# Patient Record
Sex: Female | Born: 1940
Health system: Southern US, Community
[De-identification: ages and names within clinical notes are randomized; demographics above are authoritative.]

## PROBLEM LIST (undated history)

## (undated) DIAGNOSIS — F515 Nightmare disorder: Secondary | ICD-10-CM

## (undated) DIAGNOSIS — G3184 Mild cognitive impairment, so stated: Secondary | ICD-10-CM

## (undated) DIAGNOSIS — G43909 Migraine, unspecified, not intractable, without status migrainosus: Secondary | ICD-10-CM

## (undated) DIAGNOSIS — F800 Phonological disorder: Secondary | ICD-10-CM

## (undated) DIAGNOSIS — G43901 Migraine, unspecified, not intractable, with status migrainosus: Secondary | ICD-10-CM

## (undated) DIAGNOSIS — G4750 Parasomnia, unspecified: Secondary | ICD-10-CM

## (undated) DIAGNOSIS — G43111 Migraine with aura, intractable, with status migrainosus: Secondary | ICD-10-CM

## (undated) DIAGNOSIS — G4700 Insomnia, unspecified: Secondary | ICD-10-CM

## (undated) DIAGNOSIS — K2100 Gastro-esophageal reflux disease with esophagitis, without bleeding: Secondary | ICD-10-CM

## (undated) DIAGNOSIS — R011 Cardiac murmur, unspecified: Secondary | ICD-10-CM

## (undated) DIAGNOSIS — R319 Hematuria, unspecified: Secondary | ICD-10-CM

## (undated) DIAGNOSIS — I739 Peripheral vascular disease, unspecified: Secondary | ICD-10-CM

## (undated) DIAGNOSIS — E871 Hypo-osmolality and hyponatremia: Secondary | ICD-10-CM

## (undated) DIAGNOSIS — M1990 Unspecified osteoarthritis, unspecified site: Secondary | ICD-10-CM

## (undated) DIAGNOSIS — F514 Sleep terrors [night terrors]: Secondary | ICD-10-CM

## (undated) DIAGNOSIS — H53419 Scotoma involving central area, unspecified eye: Secondary | ICD-10-CM

## (undated) DIAGNOSIS — Z87898 Personal history of other specified conditions: Secondary | ICD-10-CM

## (undated) DIAGNOSIS — G4752 REM sleep behavior disorder: Secondary | ICD-10-CM

## (undated) HISTORY — DX: Hematuria, unspecified: R31.9

## (undated) HISTORY — DX: Unspecified osteoarthritis, unspecified site: M19.90

## (undated) HISTORY — DX: Personal history of other specified conditions: Z87.898

## (undated) HISTORY — DX: Cardiac murmur, unspecified: R01.1

## (undated) HISTORY — DX: Gastro-esophageal reflux disease with esophagitis, without bleeding: K21.00

## (undated) HISTORY — DX: Peripheral vascular disease, unspecified: I73.9

## (undated) HISTORY — PX: CATARACT EXTRACTION, BILATERAL: SHX1313

## (undated) HISTORY — DX: Migraine, unspecified, not intractable, without status migrainosus: G43.909

## (undated) HISTORY — DX: Migraine, unspecified, not intractable, with status migrainosus: G43.901

## (undated) HISTORY — DX: Gastro-esophageal reflux disease with esophagitis: K21.0

## (undated) HISTORY — PX: CARPAL TUNNEL RELEASE: SHX101

---
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
## 1997-08-29 ENCOUNTER — Other Ambulatory Visit: Admission: RE | Admit: 1997-08-29 | Discharge: 1997-08-29 | Payer: Self-pay | Admitting: Gynecology

## 1998-03-16 ENCOUNTER — Other Ambulatory Visit: Admission: RE | Admit: 1998-03-16 | Discharge: 1998-03-16 | Payer: Self-pay | Admitting: Gynecology

## 1998-05-22 ENCOUNTER — Encounter: Payer: Self-pay | Admitting: Gynecology

## 1998-05-22 ENCOUNTER — Ambulatory Visit (HOSPITAL_COMMUNITY): Admission: RE | Admit: 1998-05-22 | Discharge: 1998-05-22 | Payer: Self-pay | Admitting: Gynecology

## 1999-04-26 ENCOUNTER — Other Ambulatory Visit: Admission: RE | Admit: 1999-04-26 | Discharge: 1999-04-26 | Payer: Self-pay | Admitting: Gynecology

## 1999-06-27 ENCOUNTER — Other Ambulatory Visit: Admission: RE | Admit: 1999-06-27 | Discharge: 1999-06-27 | Payer: Self-pay | Admitting: Gynecology

## 1999-06-29 ENCOUNTER — Ambulatory Visit (HOSPITAL_COMMUNITY): Admission: RE | Admit: 1999-06-29 | Discharge: 1999-06-29 | Payer: Self-pay | Admitting: *Deleted

## 2000-05-23 ENCOUNTER — Other Ambulatory Visit: Admission: RE | Admit: 2000-05-23 | Discharge: 2000-05-23 | Payer: Self-pay | Admitting: Gynecology

## 2000-06-10 ENCOUNTER — Encounter: Admission: RE | Admit: 2000-06-10 | Discharge: 2000-06-10 | Payer: Self-pay | Admitting: Cardiology

## 2000-06-10 ENCOUNTER — Encounter: Payer: Self-pay | Admitting: Cardiology

## 2000-06-11 ENCOUNTER — Other Ambulatory Visit: Admission: RE | Admit: 2000-06-11 | Discharge: 2000-06-11 | Payer: Self-pay | Admitting: Gynecology

## 2000-06-11 ENCOUNTER — Encounter (INDEPENDENT_AMBULATORY_CARE_PROVIDER_SITE_OTHER): Payer: Self-pay

## 2000-06-13 ENCOUNTER — Ambulatory Visit (HOSPITAL_COMMUNITY): Admission: RE | Admit: 2000-06-13 | Discharge: 2000-06-13 | Payer: Self-pay | Admitting: Cardiology

## 2000-07-04 ENCOUNTER — Encounter: Payer: Self-pay | Admitting: Gynecology

## 2000-07-04 ENCOUNTER — Ambulatory Visit (HOSPITAL_COMMUNITY): Admission: RE | Admit: 2000-07-04 | Discharge: 2000-07-04 | Payer: Self-pay | Admitting: Gynecology

## 2000-09-15 ENCOUNTER — Other Ambulatory Visit: Admission: RE | Admit: 2000-09-15 | Discharge: 2000-09-15 | Payer: Self-pay | Admitting: Gynecology

## 2000-10-29 ENCOUNTER — Encounter (INDEPENDENT_AMBULATORY_CARE_PROVIDER_SITE_OTHER): Payer: Self-pay | Admitting: Specialist

## 2000-10-29 ENCOUNTER — Ambulatory Visit (HOSPITAL_COMMUNITY): Admission: RE | Admit: 2000-10-29 | Discharge: 2000-10-29 | Payer: Self-pay | Admitting: Gynecology

## 2001-03-30 ENCOUNTER — Other Ambulatory Visit: Admission: RE | Admit: 2001-03-30 | Discharge: 2001-03-30 | Payer: Self-pay | Admitting: Gynecology

## 2001-07-17 ENCOUNTER — Encounter: Admission: RE | Admit: 2001-07-17 | Discharge: 2001-07-17 | Payer: Self-pay | Admitting: Internal Medicine

## 2001-07-17 ENCOUNTER — Encounter: Payer: Self-pay | Admitting: Internal Medicine

## 2001-08-27 ENCOUNTER — Encounter: Payer: Self-pay | Admitting: Internal Medicine

## 2001-08-27 ENCOUNTER — Ambulatory Visit (HOSPITAL_COMMUNITY): Admission: RE | Admit: 2001-08-27 | Discharge: 2001-08-27 | Payer: Self-pay | Admitting: Internal Medicine

## 2001-11-05 ENCOUNTER — Other Ambulatory Visit: Admission: RE | Admit: 2001-11-05 | Discharge: 2001-11-05 | Payer: Self-pay | Admitting: Gynecology

## 2002-06-03 ENCOUNTER — Other Ambulatory Visit: Admission: RE | Admit: 2002-06-03 | Discharge: 2002-06-03 | Payer: Self-pay | Admitting: Gynecology

## 2003-06-12 ENCOUNTER — Emergency Department (HOSPITAL_COMMUNITY): Admission: EM | Admit: 2003-06-12 | Discharge: 2003-06-13 | Payer: Self-pay | Admitting: Emergency Medicine

## 2004-05-09 ENCOUNTER — Ambulatory Visit (HOSPITAL_COMMUNITY): Admission: RE | Admit: 2004-05-09 | Discharge: 2004-05-09 | Payer: Self-pay | Admitting: Internal Medicine

## 2004-08-17 ENCOUNTER — Other Ambulatory Visit: Admission: RE | Admit: 2004-08-17 | Discharge: 2004-08-17 | Payer: Self-pay | Admitting: Gynecology

## 2005-05-21 ENCOUNTER — Ambulatory Visit (HOSPITAL_COMMUNITY): Admission: RE | Admit: 2005-05-21 | Discharge: 2005-05-21 | Payer: Self-pay | Admitting: Internal Medicine

## 2005-08-29 ENCOUNTER — Other Ambulatory Visit: Admission: RE | Admit: 2005-08-29 | Discharge: 2005-08-29 | Payer: Self-pay | Admitting: Gynecology

## 2006-05-22 ENCOUNTER — Ambulatory Visit (HOSPITAL_COMMUNITY): Admission: RE | Admit: 2006-05-22 | Discharge: 2006-05-22 | Payer: Self-pay | Admitting: Internal Medicine

## 2006-09-01 ENCOUNTER — Other Ambulatory Visit: Admission: RE | Admit: 2006-09-01 | Discharge: 2006-09-01 | Payer: Self-pay | Admitting: Gynecology

## 2006-11-08 ENCOUNTER — Encounter: Admission: RE | Admit: 2006-11-08 | Discharge: 2006-11-08 | Payer: Self-pay | Admitting: Internal Medicine

## 2007-06-02 ENCOUNTER — Ambulatory Visit (HOSPITAL_COMMUNITY): Admission: RE | Admit: 2007-06-02 | Discharge: 2007-06-02 | Payer: Self-pay | Admitting: Internal Medicine

## 2007-10-08 ENCOUNTER — Encounter: Admission: RE | Admit: 2007-10-08 | Discharge: 2007-10-08 | Payer: Self-pay | Admitting: Internal Medicine

## 2008-06-16 ENCOUNTER — Ambulatory Visit (HOSPITAL_COMMUNITY): Admission: RE | Admit: 2008-06-16 | Discharge: 2008-06-16 | Payer: Self-pay | Admitting: Internal Medicine

## 2008-08-31 ENCOUNTER — Encounter: Admission: RE | Admit: 2008-08-31 | Discharge: 2008-08-31 | Payer: Self-pay | Admitting: Neurosurgery

## 2009-03-07 ENCOUNTER — Encounter: Admission: RE | Admit: 2009-03-07 | Discharge: 2009-03-07 | Payer: Self-pay | Admitting: Otolaryngology

## 2009-09-19 ENCOUNTER — Ambulatory Visit (HOSPITAL_COMMUNITY): Admission: RE | Admit: 2009-09-19 | Discharge: 2009-09-19 | Payer: Self-pay | Admitting: Internal Medicine

## 2009-09-26 ENCOUNTER — Encounter: Admission: RE | Admit: 2009-09-26 | Discharge: 2009-09-26 | Payer: Self-pay | Admitting: Otolaryngology

## 2009-10-11 ENCOUNTER — Other Ambulatory Visit: Admission: RE | Admit: 2009-10-11 | Discharge: 2009-10-11 | Payer: Self-pay | Admitting: Interventional Radiology

## 2009-10-11 ENCOUNTER — Encounter: Admission: RE | Admit: 2009-10-11 | Discharge: 2009-10-11 | Payer: Self-pay | Admitting: Otolaryngology

## 2010-02-19 ENCOUNTER — Encounter: Admission: RE | Admit: 2010-02-19 | Discharge: 2010-02-19 | Payer: Self-pay | Admitting: Otolaryngology

## 2010-03-23 ENCOUNTER — Encounter
Admission: RE | Admit: 2010-03-23 | Discharge: 2010-03-23 | Payer: Self-pay | Source: Home / Self Care | Attending: Orthopedic Surgery | Admitting: Orthopedic Surgery

## 2010-08-10 NOTE — Cardiovascular Report (Signed)
[place]  Patient:    Kara Tapia, Kara Tapia                        MRN: [id number] Proc. Date: 06/13/00 Adm. Date:  [id number] Disc. Date: [id number] Attending:  Loreli Dollar CC:         Jenel Lucks, M.D.  Cardiac Catheterization Lab   Cardiac Catheterization  INDICATIONS FOR TEST:  [name] is a 70 year old who began having episodes of exertional pressure and tightness about a week ago.  The episodes began occurring at rest and for no apparent reason.  There was no radiation pattern. It had anginal quality to it.  She had had a nuclear study a year ago that was unremarkable for a different type of less significant chest pain.  Because of the above-mentioned symptoms, she was brought in for outpatient cardiac catheterization.  PROCEDURES: 1. Left heart catheterization. 2. Selective right and left coronary arteriography. 3. Ventriculography in the RAO projection.  CARDIOLOGIST:  Thereasa Solo. Little, M.D.  COMPLICATIONS:  None.  EQUIPMENT USED:  6-French Judkins configuration catheters.  DESCRIPTION OF PROCEDURE:  The patient was prepped and draped in the usual sterile fashion exposing the right groin, applying local anesthetic with 1% Novocaine.  The Seldinger technique was employed and 6-French introducer sheath placed in the right femoral artery.  Selective right and left coronary arteriography and ventriculography in the RAO projection using 25 cc of contrast at 12 cc per second was performed.  RESULTS:  I.   HEMODYNAMIC MONITORING:  Central aortic pressure 157/83, left ventricular      pressure 165/17, and there was a 7 mm aortic valve gradient noted at the      time of pullback. II.  VENTRICULOGRAPHY: Ventriculography in the RAO projection revealed normal      left ventricular systolic function with ejection fraction greater than      60%.  End-diastolic pressure was 16.  Mitral valve prolapse without      mitral regurgitation was  seen.  III. CORONARY ARTERIOGRAPHY:  On fluoroscopy, there was a minor nonobstructive      flake of calcium noted in the proximal portion of the LAD.      1. Left main normal.      2. LAD: The LAD extended down and around the apex of the heart supplying         the distal portion of the posterior wall.  The LAD gave rise to a very         large first diagonal branch, and the its entire system was free of         disease except for the above-mentioned flake of calcium in the         proximal portion.      3. Circumflex: The Circumflex was a dominant system with a very large         OM-1, a small OM-2 and OM-3, and a medium-size PDA.  This entire         system was free of disease.      4. RCA: The RCA was a small nondominant vessel supplying only the RV.  CONCLUSION: 1. Moderate calcification in the left anterior descending artery with no    evidence of obstruction. 2. Normal left ventricular systolic function. 3. Mitral valve prolapse without mitral regurgitation.  At this point, I cannot explain her chest pain from a cardiac standpoint other than perhaps from her  mitral valve prolapse.  She will be discharged to home later today with followup in my office for reevaluation of the catheterization site and to go over catheterization results. DD:  06/13/00 TD:  06/14/00 Job: [id number]

## 2010-08-10 NOTE — Op Note (Signed)
[facility]  Patient:    Kara Tapia, Kara Tapia                        MRN: [id number] Proc. Date: 10/30/00 Adm. Date:  [id number] Attending:  Katrina Stack CC:         [facility]   Operative Report  PREOPERATIVE DIAGNOSES:  Persistent abnormal genital cytology with inadequate colposcopy.  POSTOPERATIVE DIAGNOSES:  Persistent abnormal genital cytology with inadequate colposcopy.  PROCEDURE:  LEEP cone.  ANESTHESIA:  Paracervical block, 0.25% Marcaine.  DESCRIPTION OF PROCEDURE:  Under excellent paracervical block anesthesia with the patient prepped and draped in the lithotomy position with her cervix stained with Lugol iodine, LEEP cone procedure was performed with excision using the smallest [place] loop, a cylinder of tissue was excised and submitted for pathologic examination.  The initial cone was carried approximately 1.5 cm down the canal.  An additional excision beyond that for another several millimeters was used to excise the upper portion of transformation zone and to shape the cone.  At the end of the procedure there was no significant bleeding.  Procedure was terminated without complications.  Patient returned to the recovery room in excellent condition. DD:  10/29/00 TD:  10/29/00 Job: [id number]

## 2010-10-11 ENCOUNTER — Other Ambulatory Visit (HOSPITAL_COMMUNITY): Payer: Self-pay | Admitting: Internal Medicine

## 2010-10-11 DIAGNOSIS — Z1231 Encounter for screening mammogram for malignant neoplasm of breast: Secondary | ICD-10-CM

## 2010-10-19 ENCOUNTER — Ambulatory Visit (HOSPITAL_COMMUNITY)
Admission: RE | Admit: 2010-10-19 | Discharge: 2010-10-19 | Disposition: A | Discharge status: Home / Self Care | Payer: Medicare Other | Source: Ambulatory Visit | Attending: Internal Medicine | Admitting: Internal Medicine

## 2010-10-19 DIAGNOSIS — Z1231 Encounter for screening mammogram for malignant neoplasm of breast: Secondary | ICD-10-CM

## 2011-05-13 ENCOUNTER — Other Ambulatory Visit: Payer: Self-pay

## 2011-05-13 ENCOUNTER — Encounter (HOSPITAL_COMMUNITY): Payer: Self-pay | Admitting: *Deleted

## 2011-05-13 ENCOUNTER — Emergency Department (HOSPITAL_COMMUNITY): Payer: Medicare Other

## 2011-05-13 ENCOUNTER — Emergency Department (HOSPITAL_COMMUNITY)
Admission: EM | Admit: 2011-05-13 | Discharge: 2011-05-13 | Disposition: A | Discharge status: Home / Self Care | Payer: Medicare Other | Attending: Emergency Medicine | Admitting: Emergency Medicine

## 2011-05-13 DIAGNOSIS — Z79899 Other long term (current) drug therapy: Secondary | ICD-10-CM | POA: Insufficient documentation

## 2011-05-13 DIAGNOSIS — F17200 Nicotine dependence, unspecified, uncomplicated: Secondary | ICD-10-CM | POA: Insufficient documentation

## 2011-05-13 DIAGNOSIS — R079 Chest pain, unspecified: Secondary | ICD-10-CM | POA: Insufficient documentation

## 2011-05-13 LAB — BASIC METABOLIC PANEL
CO2: 26 mEq/L (ref 19–32)
Calcium: 9.7 mg/dL (ref 8.4–10.5)
Creatinine, Ser: 0.83 mg/dL (ref 0.50–1.10)
Glucose, Bld: 105 mg/dL — ABNORMAL HIGH (ref 70–99)

## 2011-05-13 LAB — CBC
HCT: 42.2 % (ref 36.0–46.0)
Hemoglobin: 14.8 g/dL (ref 12.0–15.0)
MCH: 31.8 pg (ref 26.0–34.0)
MCHC: 35.1 g/dL (ref 30.0–36.0)
MCV: 90.8 fL (ref 78.0–100.0)

## 2011-05-13 LAB — HEPATIC FUNCTION PANEL
AST: 24 U/L (ref 0–37)
Albumin: 4.2 g/dL (ref 3.5–5.2)
Alkaline Phosphatase: 80 U/L (ref 39–117)
Total Bilirubin: 0.5 mg/dL (ref 0.3–1.2)

## 2011-05-13 LAB — URINALYSIS, ROUTINE W REFLEX MICROSCOPIC
Bilirubin Urine: NEGATIVE
Glucose, UA: NEGATIVE mg/dL
Ketones, ur: NEGATIVE mg/dL
Protein, ur: NEGATIVE mg/dL

## 2011-05-13 LAB — URINE MICROSCOPIC-ADD ON

## 2011-05-13 LAB — TROPONIN I: Troponin I: <0.3 ng/mL (ref <0.30)

## 2011-05-13 NOTE — Discharge Instructions (Signed)
Chest Pain (Nonspecific) It is often hard to give a specific diagnosis for the cause of chest pain. There is always a chance that your pain could be related to something serious, such as a heart attack or a blood clot in the lungs. You need to follow up with your caregiver for further evaluation. CAUSES   Heartburn.   Pneumonia or bronchitis.   Anxiety and stress.   Inflammation around your heart (pericarditis) or lung (pleuritis or pleurisy).   A blood clot in the lung.   A collapsed lung (pneumothorax). It can develop suddenly on its own (spontaneous pneumothorax) or from injury (trauma) to the chest.  The chest wall is composed of bones, muscles, and cartilage. Any of these can be the source of the pain.  The bones can be bruised by injury.   The muscles or cartilage can be strained by coughing or overwork.   The cartilage can be affected by inflammation and become sore (costochondritis).  DIAGNOSIS  Lab tests or other studies, such as X-rays, an EKG, stress testing, or cardiac imaging, may be needed to find the cause of your pain.  TREATMENT   Treatment depends on what may be causing your chest pain. Treatment may include:   Acid blockers for heartburn.   Anti-inflammatory medicine.   Pain medicine for inflammatory conditions.   Antibiotics if an infection is present.   You may be advised to change lifestyle habits. This includes stopping smoking and avoiding caffeine and chocolate.   You may be advised to keep your head raised (elevated) when sleeping. This reduces the chance of acid going backward from your stomach into your esophagus.   Most of the time, nonspecific chest pain will improve within 2 to 3 days with rest and mild pain medicine.  HOME CARE INSTRUCTIONS   If antibiotics were prescribed, take the full amount even if you start to feel better.   For the next few days, avoid physical activities that bring on chest pain. Continue physical activities as  directed.   Do not smoke cigarettes or drink alcohol until your symptoms are gone.   Only take over-the-counter or prescription medicine for pain, discomfort, or fever as directed by your caregiver.   Follow your caregiver's suggestions for further testing if your chest pain does not go away.   Keep any follow-up appointments you made. If you do not go to an appointment, you could develop lasting (chronic) problems with pain. If there is any problem keeping an appointment, you must call to reschedule.  SEEK MEDICAL CARE IF:   You think you are having problems from the medicine you are taking. Read your medicine instructions carefully.   Your chest pain does not go away, even after treatment.   You develop a rash with blisters on your chest.  SEEK IMMEDIATE MEDICAL CARE IF:   You have increased chest pain or pain that spreads to your arm, neck, jaw, back, or belly (abdomen).   You develop shortness of breath, an increasing cough, or you are coughing up blood.   You have severe back or abdominal pain, feel sick to your stomach (nauseous) or throw up (vomit).   You develop severe weakness, fainting, or chills.   You have an oral temperature above 102 F (38.9 C), not controlled by medicine.  THIS IS AN EMERGENCY. Do not wait to see if the pain will go away. Get medical help at once. Call your local emergency services (911 in [place] Do not drive yourself to   the hospital. MAKE SURE YOU:   Understand these instructions.   Will watch your condition.   Will get help right away if you are not doing well or get worse.  Document Released: 12/19/2004 Document Revised: 11/21/2010 Document Reviewed: 10/15/2007 [employer] Patient Information 2012 [employer]. 

## 2011-05-13 NOTE — ED Provider Notes (Signed)
History     CSN: [id number]  Arrival date & time 05/13/11  1350   First MD Initiated Contact with Patient 05/13/11 1501      Chief Complaint  Patient presents with  . Chest Pain     HPI The patient awoke with chest pain, 7 hours prior to presentation.  She notes that since onset she has had richer near her sternum with radiation to her back.  The pain is worse with deep inspiration.  She also notes mild dyspnea, mild lightheadedness, mild nausea.  No syncope, no vomiting.  No clear exertional pain.  No relief with anything, no other clear exacerbating factors. History reviewed. No pertinent past medical history.  History reviewed. No pertinent past surgical history.  No family history on file.  History  Substance Use Topics  . Smoking status: Current Everyday Smoker -- 0.5 packs/day    Types: Cigarettes  . Smokeless tobacco: Not on file  . Alcohol Use: 4.2 oz/week    7 Glasses of wine per week    OB History    Grav Para Term Preterm Abortions TAB SAB Ect Mult Living                  Review of Systems  Constitutional:       HPI  HENT:       HPI otherwise negative  Eyes: Negative.   Respiratory:       HPI, otherwise negative  Cardiovascular:       HPI, otherwise nmegative  Gastrointestinal: Negative for vomiting.  Genitourinary:       HPI, otherwise negative  Musculoskeletal:       HPI, otherwise negative  Skin: Negative.   Neurological: Negative for syncope.    Allergies  Review of patient's allergies indicates not on file.  Home Medications   Current Outpatient Rx  Name Route Sig Dispense Refill  . MELATONIN 5 MG PO TABS Oral Take 5 mg by mouth at bedtime.    . OMEPRAZOLE MAGNESIUM 20 MG PO TBEC Oral Take 20 mg by mouth daily.      BP 171/95  Pulse 86  Temp(Src) 97.8 F (36.6 C) (Oral)  Resp 16  SpO2 98%  Physical Exam  Nursing note and vitals reviewed. Constitutional: She is oriented to person, place, and time. She appears well-developed  and well-nourished. No distress.  HENT:  Head: Normocephalic and atraumatic.  Eyes: Conjunctivae and EOM are normal.  Cardiovascular: Normal rate and regular rhythm.   Pulmonary/Chest: Effort normal and breath sounds normal. No stridor. No respiratory distress.  Abdominal: She exhibits no distension.  Musculoskeletal: She exhibits no edema.  Neurological: She is alert and oriented to person, place, and time. No cranial nerve deficit.  Skin: Skin is warm and dry.  Psychiatric: She has a normal mood and affect.    ED Course  Procedures (including critical care time)  Labs Reviewed  BASIC METABOLIC PANEL - Abnormal; Notable for the following:    Glucose, Bld 105 (*)    GFR calc non Af Amer 70 (*)    GFR calc Af Amer 82 (*)    All other components within normal limits  CBC  TROPONIN I  CBC  COMPREHENSIVE METABOLIC PANEL  LIPASE, BLOOD  D-DIMER, QUANTITATIVE   Dg Chest 2 View  05/13/2011  *RADIOLOGY REPORT*  Clinical Data: Chest pain, smoking history  CHEST - 2 VIEW  Comparison: Chest x-ray of 06/06/2009  Findings: No active infiltrate or effusion is seen.  Mediastinal contours  appear stable.  The heart is within normal limits in size. No bony abnormality is seen.  IMPRESSION: No active lung disease.  Original Report Authenticated By: Juline Patch, M.D.   cxr reviewed by me  Cardiac: 75 sr, normal  Pulse ox 100% 2 L [place], abnormal    Date: 05/13/2011  Rate: 87  Rhythm: normal sinus rhythm  QRS Axis: normal  Intervals: normal  ST/T Wave abnormalities: normal  Conduction Disutrbances:none  Narrative Interpretation:   Old EKG Reviewed: none available  rSR' - borderline ECG  SMOKING COUNSELING PROVIDED  No diagnosis found.   R/o acs / pe / pna.  Sx likely PNA vs. COPD. MDM  This 71 year old female now presents with one day of chest discomfort.  On exam she is in no distress, with no notable physical exam findings.  The patient's history of cigarette usage suggestive of  undiagnosed COPD.  The patient's labs are reassuring, including a negative dimer and troponin which is negative.  Given the description of the full day of chest pain cardiac ischemia would likely result in a positive troponin.  All results were discussed with the patient and her sister.  The patient was discharged in stable condition to follow up with her primary care physician this week.  Return precautions, as well as suggestions for additional evaluation, including pulmonary function testing were provided.        Gerhard Munch, MD 05/13/11 [phone]

## 2011-05-13 NOTE — ED Notes (Signed)
Pt reports onset of chest pain around 0800 with constant chest pain. States located in central chest between breast bones, radiates to back. Reports mild shortness of breath, lightheadedness. No aspirin PTA. No radiation to arms. Reports nausea, no vomiting.

## 2011-05-13 NOTE — ED Notes (Signed)
Pt reports pain worse with deep breath. States pain began this am around 0800.

## 2012-01-10 ENCOUNTER — Other Ambulatory Visit (HOSPITAL_COMMUNITY): Payer: Self-pay | Admitting: Internal Medicine

## 2012-01-10 DIAGNOSIS — Z1231 Encounter for screening mammogram for malignant neoplasm of breast: Secondary | ICD-10-CM

## 2012-01-23 ENCOUNTER — Ambulatory Visit (HOSPITAL_COMMUNITY)
Admission: RE | Admit: 2012-01-23 | Discharge: 2012-01-23 | Disposition: A | Discharge status: Home / Self Care | Payer: Medicare Other | Source: Ambulatory Visit | Attending: Internal Medicine | Admitting: Internal Medicine

## 2012-01-23 DIAGNOSIS — Z1231 Encounter for screening mammogram for malignant neoplasm of breast: Secondary | ICD-10-CM | POA: Insufficient documentation

## 2012-02-19 ENCOUNTER — Other Ambulatory Visit: Payer: Self-pay | Admitting: Gynecology

## 2012-06-21 ENCOUNTER — Ambulatory Visit (INDEPENDENT_AMBULATORY_CARE_PROVIDER_SITE_OTHER): Payer: BC Managed Care – PPO | Admitting: Emergency Medicine

## 2012-06-21 VITALS — BP 136/84 | HR 82 | Temp 97.8°F | Resp 16 | Ht 65.38 in | Wt 135.6 lb

## 2012-06-21 DIAGNOSIS — J018 Other acute sinusitis: Secondary | ICD-10-CM

## 2012-06-21 MED ORDER — MUCINEX DM MAXIMUM STRENGTH 60-1200 MG PO TB12: 1.0000 | ORAL_TABLET | Freq: Two times a day (BID) | ORAL | Status: DC

## 2012-06-21 MED ORDER — AMOXICILLIN-POT CLAVULANATE 875-125 MG PO TABS: 1.0000 | ORAL_TABLET | Freq: Two times a day (BID) | ORAL | Status: DC

## 2012-06-21 NOTE — Progress Notes (Signed)
[facility] [address] [place] [address] [phone]  Date:  06/21/2012   Name:  Kara Tapia   DOB:  [birth year]   MRN:  [id number]  PCP:  No primary provider on file.    Chief Complaint: Sinusitis   History of Present Illness:  Kara Tapia is a 72 y.o. very pleasant female patient who presents with the following:  Ill with nasal congestion, purulent discharge and cough.  Cough is productive mucoid sputum occasionally.  No wheezing or shortness of breath.  Has severe frontal headache.  Chills but no fever.  No nausea or vomiting.  No neuro  Or visual symptoms.  No improvement with over the counter medications or other home remedies.  Denies other complaint or health concern today.   There is no problem list on file for this patient.   No past medical history on file.  No past surgical history on file.  History  Substance Use Topics  . Smoking status: Current Every Day Smoker -- 0.50 packs/day    Types: Cigarettes  . Smokeless tobacco: Not on file  . Alcohol Use: 3.0 oz/week    5 Glasses of wine per week    Family History  Problem Relation Age of Onset  . Diabetes Mother   . Heart disease Mother   . Cancer Mother   . Cancer Father   . Lung disease Sister     No Known Allergies  Medication list has been reviewed and updated.  Current Outpatient Prescriptions on File Prior to Visit  Medication Sig Dispense Refill  . Melatonin 5 MG TABS Take 5 mg by mouth as needed.       Marland Kitchen omeprazole (PRILOSEC OTC) 20 MG tablet Take 20 mg by mouth daily.       No current facility-administered medications on file prior to visit.    Review of Systems:  As per HPI, otherwise negative.    Physical Examination: Filed Vitals:   06/21/12 1241  BP: 136/84  Pulse: 82  Temp: 97.8 F (36.6 C)  Resp: 16   Filed Vitals:   06/21/12 1241  Height: 5' 5.38" (1.661 m)  Weight: 135 lb 9.6 oz (61.508 kg)   Body mass index is 22.29 kg/(m^2). Ideal  Body Weight: Weight in (lb) to have BMI = 25: 151.7  GEN: WDWN, NAD, Non-toxic, A & O x 3 HEENT: Atraumatic, Normocephalic. Neck supple. No masses, No LAD. Ears and Nose: No external deformity. CV: RRR, No M/G/R. No JVD. No thrill. No extra heart sounds. PULM: CTA B, no wheezes, crackles, rhonchi. No retractions. No resp. distress. No accessory muscle use. ABD: S, NT, ND, +BS. No rebound. No HSM. EXTR: No c/c/e NEURO Normal gait.  PSYCH: Normally interactive. Conversant. Not depressed or anxious appearing.  Calm demeanor.    Assessment and Plan: Sinusitis augmentin mucinex Follow up as needed  Signed,  Phillips Odor, MD

## 2012-06-21 NOTE — Patient Instructions (Addendum)

## 2012-08-19 ENCOUNTER — Other Ambulatory Visit: Payer: Self-pay | Admitting: Internal Medicine

## 2012-08-19 DIAGNOSIS — R911 Solitary pulmonary nodule: Secondary | ICD-10-CM

## 2012-08-25 ENCOUNTER — Ambulatory Visit
Admission: RE | Admit: 2012-08-25 | Discharge: 2012-08-25 | Disposition: A | Discharge status: Home / Self Care | Payer: Medicare Other | Source: Ambulatory Visit | Attending: Internal Medicine | Admitting: Internal Medicine

## 2012-08-25 DIAGNOSIS — R911 Solitary pulmonary nodule: Secondary | ICD-10-CM

## 2012-11-24 ENCOUNTER — Other Ambulatory Visit: Payer: Self-pay | Admitting: Internal Medicine

## 2012-11-24 DIAGNOSIS — R911 Solitary pulmonary nodule: Secondary | ICD-10-CM

## 2012-11-30 ENCOUNTER — Ambulatory Visit
Admission: RE | Admit: 2012-11-30 | Discharge: 2012-11-30 | Disposition: A | Discharge status: Home / Self Care | Payer: Medicare Other | Source: Ambulatory Visit | Attending: Internal Medicine | Admitting: Internal Medicine

## 2012-11-30 DIAGNOSIS — R911 Solitary pulmonary nodule: Secondary | ICD-10-CM

## 2013-01-28 ENCOUNTER — Other Ambulatory Visit (HOSPITAL_COMMUNITY): Payer: Self-pay | Admitting: Obstetrics and Gynecology

## 2013-01-28 DIAGNOSIS — Z1231 Encounter for screening mammogram for malignant neoplasm of breast: Secondary | ICD-10-CM

## 2013-02-11 ENCOUNTER — Ambulatory Visit (HOSPITAL_COMMUNITY): Payer: Medicare Other

## 2013-04-03 ENCOUNTER — Ambulatory Visit (INDEPENDENT_AMBULATORY_CARE_PROVIDER_SITE_OTHER): Payer: Medicare Other | Admitting: Family Medicine

## 2013-04-03 VITALS — BP 126/74 | HR 86 | Temp 98.8°F | Resp 16 | Ht 65.0 in | Wt 134.0 lb

## 2013-04-03 DIAGNOSIS — J329 Chronic sinusitis, unspecified: Secondary | ICD-10-CM

## 2013-04-03 MED ORDER — AZITHROMYCIN 250 MG PO TABS: ORAL_TABLET | ORAL | Status: DC

## 2013-04-03 NOTE — Progress Notes (Signed)
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  4 days of sinus pressure and cough.  No nausea, epistaxis, shortness of breath. No h/o recurrent sinus problems.  Tried Netipot  Objective:  NAD HEENT: red throat, nasal swelling and normal tm's Chest:  Bibasilar rales(few)  Assessment:  Sinusitis  Sinusitis - Plan: azithromycin (ZITHROMAX Z-PAK) 250 MG tablet  Signed, Robyn Haber, MD

## 2013-04-03 NOTE — Patient Instructions (Signed)

## 2013-11-16 DIAGNOSIS — C4491 Basal cell carcinoma of skin, unspecified: Secondary | ICD-10-CM

## 2013-11-16 HISTORY — DX: Basal cell carcinoma of skin, unspecified: C44.91

## 2014-07-19 ENCOUNTER — Ambulatory Visit: Payer: BC Managed Care – PPO | Admitting: Neurology

## 2014-07-19 ENCOUNTER — Telehealth: Payer: Self-pay

## 2014-07-19 NOTE — Telephone Encounter (Signed)
Spoke to pt to r/s her appt since Dr. Brett Fairy cancelled. R/s for 08/09/14 at 1:00 pm.

## 2014-08-09 ENCOUNTER — Encounter: Payer: Self-pay | Admitting: Neurology

## 2014-08-09 ENCOUNTER — Ambulatory Visit (INDEPENDENT_AMBULATORY_CARE_PROVIDER_SITE_OTHER): Payer: Medicare Other | Admitting: Neurology

## 2014-08-09 VITALS — BP 126/84 | HR 72 | Resp 20 | Ht 66.93 in | Wt 138.0 lb

## 2014-08-09 DIAGNOSIS — H53419 Scotoma involving central area, unspecified eye: Secondary | ICD-10-CM

## 2014-08-09 DIAGNOSIS — G4752 REM sleep behavior disorder: Secondary | ICD-10-CM

## 2014-08-09 DIAGNOSIS — G43901 Migraine, unspecified, not intractable, with status migrainosus: Secondary | ICD-10-CM

## 2014-08-09 DIAGNOSIS — H53453 Other localized visual field defect, bilateral: Secondary | ICD-10-CM | POA: Diagnosis not present

## 2014-08-09 DIAGNOSIS — H53413 Scotoma involving central area, bilateral: Secondary | ICD-10-CM

## 2014-08-09 DIAGNOSIS — G43111 Migraine with aura, intractable, with status migrainosus: Secondary | ICD-10-CM

## 2014-08-09 HISTORY — DX: REM sleep behavior disorder: G47.52

## 2014-08-09 HISTORY — DX: Scotoma involving central area, unspecified eye: H53.419

## 2014-08-09 HISTORY — DX: Migraine with aura, intractable, with status migrainosus: G43.111

## 2014-08-09 HISTORY — DX: Migraine, unspecified, not intractable, with status migrainosus: G43.901

## 2014-08-09 MED ORDER — ELETRIPTAN HYDROBROMIDE 20 MG PO TABS: 20.0000 mg | ORAL_TABLET | ORAL | Status: DC | PRN

## 2014-08-09 NOTE — Progress Notes (Signed)
Provider:  Larey Seat, M D  Referring Provider: Merrilee Seashore, MD Primary Care Physician:  Merrilee Seashore, MD  Chief Complaint  Patient presents with  . Migraine    rm 11, alone, new patient    HPI:  Kara Tapia is a 74 y.o. female seen here as a referral  from Dr. Ashby Dawes  And Dr Kathrin Penner for visual changes.  [name] is a right-handed Caucasian female that presents with 3 isolated episodes of visual scotomata. She states that she developed a severe migraine the first severe headache of this kind in her life first one occurred on March 17 of this year and 2 other spells have followed. The first time she was also nauseated but did not have to vomit. She was very photosensitive with all of these spells. The patient reports that on March 17 she experienced the following. In the afternoon, she had noticed an increased level of photosensitivity above report she usually feels. She was going to [employer] to prepare for tutoring or restored. She notice that she couldn't read or see very well and that she had actually blind spots in her vision words literally disappeared when she scanned the paper. This lasted several hours after the visual changes finally resolved she still had a headache and she resumed and went to bed. Sit-to-stand: Quiet and a core, quiet and dark room until she felt better. She did not use any medication. She called her ophthalmologist, Dr. Juanito Doom, and her  primary care physician, Dr. Ashby Dawes and was referred her here to neurologic care. Total weeks later she had another spell not as bad in terms of headache intensity nausea.no nausea without 1. She again had the scotomata. Then a third spell occurred 2 weeks ago. I was able to review the patient's medication list and her lab results from 10-26-13. Under her past medical history migraine headaches were noted .She was never diagnosed as migraines before Dr. Hal Neer  presumably diagnosed  her with a migraine, but the patient states she had no visual aura and she had no headaches were so spells just a crawling creepy crawling dysesthesia at the left side of the skull.  The patient states she tries to keep well hydrated, and she was neither sleep deprived nor did she have any unusual dietary or exercise activity- that she could identify that as a trigger for any of the 3 spells.     Review of Systems: Out of a complete 14 system review, the patient complains of only the following symptoms, and all other reviewed systems are negative. Insomnia, had 2 "normal sleep studies, she yells in her sleep, has nightmares, 5 years.  Some- times she  remembers her nightmares. REM behavior disorder. One happened in an airplane from [place] to the [place].   History   Social History  . Marital Status: Single    Spouse Name: N/A  . Number of Children: N/A  . Years of Education: N/A   Occupational History  . Not on file.   Social History Main Topics  . Smoking status: Current Every Day Smoker -- 0.50 packs/day    Types: Cigarettes  . Smokeless tobacco: Not on file  . Alcohol Use: 3.0 oz/week    5 Glasses of wine per week  . Drug Use: No  . Sexual Activity: Not on file   Other Topics Concern  . Not on file   Social History Narrative   Drinks 3-4 cups of caffeine daily.  Family History  Problem Relation Age of Onset  . Diabetes Mother   . Heart disease Mother   . Cancer Mother   . Cancer Father   . Lung disease Sister     Past Medical History  Diagnosis Date  . PVD (peripheral vascular disease)   . Osteoarthritis   . Reflux esophagitis   . Migraine   . Arthritis   . Hematuria   . History of palpitations   . Migraine with status migrainosus 08/09/2014    Ocular migraines.    No past surgical history on file.  Current Outpatient Prescriptions  Medication Sig Dispense Refill  . azithromycin (ZITHROMAX Z-PAK) 250 MG tablet Take as directed on pack (Patient not  taking: Reported on 08/09/2014) 6 tablet 0  . Dextromethorphan-Guaifenesin (MUCINEX DM MAXIMUM STRENGTH) 60-1200 MG TB12 Take 1 tablet by mouth every 12 (twelve) hours. (Patient not taking: Reported on 08/09/2014) 20 each 0  . MELATONIN ER PO Take 1 tablet by mouth at bedtime as needed (with food).    . MULTIPLE VITAMIN PO Take 1 tablet by mouth daily.    Marland Kitchen omeprazole (PRILOSEC) 10 MG capsule Take 10 mg by mouth daily.     No current facility-administered medications for this visit.    Allergies as of 08/09/2014 - Review Complete 08/09/2014  Allergen Reaction Noted  . Caine-1 [lidocaine]  04/03/2013    Vitals: BP 126/84 mmHg  Pulse 72  Resp 20  Ht 5' 6.93" (1.7 m)  Wt 138 lb (62.596 kg)  BMI 21.66 kg/m2 Last Weight:  Wt Readings from Last 1 Encounters:  08/09/14 138 lb (62.596 kg)   Last Height:   Ht Readings from Last 1 Encounters:  08/09/14 5' 6.93" (1.7 m)    Physical exam:  General: The patient is awake, alert and appears not in acute distress. The patient is well groomed. Head: Normocephalic, atraumatic. Neck is supple. Mallampati 4, neck circumference:14 Cardiovascular:  Regular rate and rhythm , without  murmurs or carotid bruit, and without distended neck veins. Respiratory: Lungs are clear to auscultation. Skin:  Without evidence of edema, or rash Trunk: BMI is elevated and patient  has normal posture.  Neurologic exam : The patient is awake and alert, oriented to place and time.  Memory subjective  described as intact.  There is a normal attention span & concentration ability. Speech is fluent without  dysarthria, only mild dysphonia , no  aphasia. Mood and affect are appropriate.  Cranial nerves: Pupils are equal and briskly reactive to light. Funduscopic exam without  evidence of pallor or edema. Extraocular movements  in vertical and horizontal planes intact and without nystagmus.  Visual fields by finger perimetry are intact. Hearing to finger rub intact.   Facial sensation intact to fine touch. Facial motor strength is symmetric and tongue and uvula move midline. Tongue protrusion into either cheek is normal. Shoulder shrug is normal.   Motor exam:   Normal tone ,muscle bulk and symmetric  strength in all extremities.  Sensory:  Fine touch, pinprick and vibration were tested in all extremities. Proprioception was normal.  Coordination: Rapid alternating movements in the fingers/hands were normal. Finger-to-nose maneuver  normal without evidence of ataxia, dysmetria or tremor.  Gait and station: Patient walks without assistive device and is able unassisted to climb up to the exam table. Strength within normal limits. Stance is stable and normal. Tandem gait is unfragmented. Romberg testing is negative   Deep tendon reflexes: in the  upper and lower extremities  are symmetric and intact. Babinski maneuver response is  downgoing.   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 She was a sleep walker in childhood and developed sleep yelling and thrashig about 5 years ago. Parasomnia , according to patient worsened with MELATONIN.  Ocular migraine, scotoma with resolution before headaches resolve. Nausea, photophobia.   Plan:  Treatment plan and additional workup : Formally called a complicated or neurologic migraine an ocular migraine is more often seen in female patients usually in young patients, usually this school trauma resolves after the headache resolved this is the opposite for this patient .  She had 3 spells in 2 months this is a new onset problem and she needs to be evaluated for any vascular abnormality, an MRI of the brain is indicated in this case. Also we need to evaluate the visual pass specific specifically during this MRI .Dr. Kathrin Penner documented a scotoma  doing a visual field test in her office.  I will also review if her labs included a C-reactive protein and sedimentation rate and an ANA.  I like for her to take 62 m of topiramte nightly . May use Melatonin.     Asencion Partridge Camara Rosander MD 08/09/2014

## 2014-08-09 NOTE — Patient Instructions (Addendum)
Migraine Headache A migraine headache is an intense, throbbing pain on one or both sides of your head. A migraine can last for 30 minutes to several hours. CAUSES  The exact cause of a migraine headache is not always known. However, a migraine may be caused when nerves in the brain become irritated and release chemicals that cause inflammation. This causes pain. Certain things may also trigger migraines, such as:  Alcohol.  Smoking.  Stress.  Menstruation.  Aged cheeses.  Foods or drinks that contain nitrates, glutamate, aspartame, or tyramine.  Lack of sleep.  Chocolate.  Caffeine.  Hunger.  Physical exertion.  Fatigue.  Medicines used to treat chest pain (nitroglycerine), birth control pills, estrogen, and some blood pressure medicines. SIGNS AND SYMPTOMS  Pain on one or both sides of your head.  Pulsating or throbbing pain.  Severe pain that prevents daily activities.  Pain that is aggravated by any physical activity.  Nausea, vomiting, or both.  Dizziness.  Pain with exposure to bright lights, loud noises, or activity.  General sensitivity to bright lights, loud noises, or smells. Before you get a migraine, you may get warning signs that a migraine is coming (aura). An aura may include:  Seeing flashing lights.  Seeing bright spots, halos, or zigzag lines.  Having tunnel vision or blurred vision.  Having feelings of numbness or tingling.  Having trouble talking.  Having muscle weakness. DIAGNOSIS  A migraine headache is often diagnosed based on:  Symptoms.  Physical exam.  A CT scan or MRI of your head. These imaging tests cannot diagnose migraines, but they can help rule out other causes of headaches. TREATMENT Medicines may be given for pain and nausea. Medicines can also be given to help prevent recurrent migraines.  HOME CARE INSTRUCTIONS  Only take over-the-counter or prescription medicines for pain or discomfort as directed by your  health care provider. The use of long-term narcotics is not recommended.  Lie down in a dark, quiet room when you have a migraine.  Keep a journal to find out what may trigger your migraine headaches. For example, write down:  What you eat and drink.  How much sleep you get.  Any change to your diet or medicines.  Limit alcohol consumption.  Quit smoking if you smoke.  Get 7-9 hours of sleep, or as recommended by your health care provider.  Limit stress.  Keep lights dim if bright lights bother you and make your migraines worse. SEEK IMMEDIATE MEDICAL CARE IF:   Your migraine becomes severe.  You have a fever.  You have a stiff neck.  You have vision loss.  You have muscular weakness or loss of muscle control.  You start losing your balance or have trouble walking.  You feel faint or pass out.  You have severe symptoms that are different from your first symptoms. MAKE SURE YOU:   Understand these instructions.  Will watch your condition.  Will get help right away if you are not doing well or get worse. Document Released: 03/11/2005 Document Revised: 07/26/2013 Document Reviewed: 11/16/2012 [facility] Patient Information 2015 [place], [place]. This information is not intended to replace advice given to you by your health care provider. Make sure you discuss any questions you have with your health care provider. Eletriptan tablets What is this medicine? ELETRIPTAN (el ih TRIP tan) is used to treat migraines with or without aura. An aura is a strange feeling or visual disturbance that warns you of an attack. It is not used to  prevent migraines. This medicine may be used for other purposes; ask your health care provider or pharmacist if you have questions. COMMON BRAND NAME(S): Relpax What should I tell my health care provider before I take this medicine? They need to know if you have any of these conditions: -bowel disease or colitis -diabetes -family history of  heart disease -fast or irregular heart beat -heart or blood vessel disease, angina (chest pain), or previous heart attack -high blood pressure -high cholesterol -history of stroke, transient ischemic attacks (TIAs or mini-strokes), or intracranial bleeding -kidney or liver disease -overweight -poor circulation -postmenopausal or surgical removal of uterus and ovaries -Raynaud's disease -seizure disorder -an unusual or allergic reaction to eletriptan, other medicines, foods, dyes, or preservatives -pregnant or trying to get pregnant -breast-feeding How should I use this medicine? Take this medicine by mouth with a glass of water. Follow the directions on the prescription label. This medicine is taken at the first symptoms of a migraine. It is not for everyday use. If your migraine headache returns after one dose, you can take another dose as directed. You must leave at least 2 hours between doses, and do not take more than 40 mg as a single dose. Do not take more than 80 mg total in any 24 hour period. If there is no improvement at all after the first dose, do not take a second dose without talking to your doctor or health care professional. Do not take your medicine more often than directed. Talk to your pediatrician regarding the use of this medicine in children. Special care may be needed. Overdosage: If you think you have taken too much of this medicine contact a poison control center or emergency room at once. NOTE: This medicine is only for you. Do not share this medicine with others. What if I miss a dose? This does not apply; this medicine is not for regular use. What may interact with this medicine? Do not take this medicine with any of the following medications: -amiodarone -amphetamine, dextroamphetamine, or cocaine -aprepitant -certain antibiotics like clarithromycin, erythromycin, troleandomycin -cimetidine -conivaptan -dalfopristin; quinupristin -dihydroergotamine,  ergotamine, ergoloid mesylates, methysergide, or ergot-type medication - do not take within 24 hours of taking eletriptan. -diltiazem -feverfew -imatinib -medicines for fungal infections like fluconazole, itraconazole, ketoconazole, and voriconazole -medicines for HIV, AIDS -medicines for mental depression like fluvoxamine and nefazodone -mifepristone -other migraine medicines like almotriptan, sumatriptan, naratriptan, rizatriptan, zolmitriptan - do not take within 24 hours of taking eletriptan. -tryptophan -verapamil This medicine may also interact with the following medications: -medicines for mental depression, anxiety or mood problems This list may not describe all possible interactions. Give your health care provider a list of all the medicines, herbs, non-prescription drugs, or dietary supplements you use. Also tell them if you smoke, drink alcohol, or use illegal drugs. Some items may interact with your medicine. What should I watch for while using this medicine? Only take this medicine for a migraine headache. Take it if you get warning symptoms or at the start of a migraine attack. It is not for regular use to prevent migraine attacks. You may get drowsy or dizzy. Do not drive, use machinery, or do anything that needs mental alertness until you know how this medicine affects you. To reduce dizzy or fainting spells, do not sit or stand up quickly, especially if you are an older patient. Alcohol can increase drowsiness, dizziness and flushing. Avoid alcoholic drinks. Smoking cigarettes may increase the risk of heart-related side effects from using this  medicine. If you take migraine medicines for 10 or more days a month, your migraines may get worse. Keep a diary of headache days and medicine use. Contact your healthcare professional if your migraine attacks occur more frequently. What side effects may I notice from receiving this medicine? Side effects that you should report to your  doctor or health care professional as soon as possible: -allergic reactions like skin rash, itching or hives, swelling of the face, lips, or tongue -fast, slow, or irregular heart beat -increased or decreased blood pressure -seizures -severe stomach pain and cramping, bloody diarrhea -signs and symptoms of a blood clot such as breathing problems; changes in vision; chest pain; severe, sudden headache; pain, swelling, warmth in the leg; trouble speaking; sudden numbness or weakness of the face, arm or leg -tingling, pain, or numbness in the face, hands, or feet Side effects that usually do not require medical attention (report to your doctor or health care professional if they continue or are bothersome): -drowsiness -feeling warm, flushing, or redness of the face -headache -muscle cramps, pain -nausea, vomiting -unusually weak or tired This list may not describe all possible side effects. Call your doctor for medical advice about side effects. You may report side effects to FDA at 1-800-FDA-1088. Where should I keep my medicine? Keep out of the reach of children. Store at room temperature between 15 and 30 degrees C (59 and 86 degrees F). Throw away any unused medicine after the expiration date. NOTE: This sheet is a summary. It may not cover all possible information. If you have questions about this medicine, talk to your doctor, pharmacist, or health care provider.  2015, [employer]. (2012-11-10 10:22:32)

## 2014-08-26 ENCOUNTER — Ambulatory Visit
Admission: RE | Admit: 2014-08-26 | Discharge: 2014-08-26 | Disposition: A | Discharge status: Home / Self Care | Payer: Medicare Other | Source: Ambulatory Visit | Attending: Neurology | Admitting: Neurology

## 2014-08-26 DIAGNOSIS — H53413 Scotoma involving central area, bilateral: Secondary | ICD-10-CM

## 2014-08-26 DIAGNOSIS — H53453 Other localized visual field defect, bilateral: Secondary | ICD-10-CM | POA: Diagnosis not present

## 2014-08-26 MED ORDER — GADOBENATE DIMEGLUMINE 529 MG/ML IV SOLN
12.0000 mL | Freq: Once | INTRAVENOUS | Status: AC | PRN
  Administered 2014-08-26: 12 mL via INTRAVENOUS

## 2014-09-05 ENCOUNTER — Telehealth: Payer: Self-pay

## 2014-09-05 NOTE — Telephone Encounter (Signed)
Returned pt's call and relayed the MRI results to her. Explained to pt that white matter disease is age related and that the communication between the different parts of the brain is slowed down. Pt verbalized understanding. She wanted to know whether to keep her f/u appt and I encouraged her to keep that appt so we can discuss results further with her.

## 2014-09-05 NOTE — Telephone Encounter (Signed)
Patient called returning Kristen's call. Please call and advise. Patient can be reached at [phone].

## 2014-09-05 NOTE — Telephone Encounter (Signed)
-----   Message from Larey Seat, MD sent at 09/01/2014  4:59 PM EDT ----- Dema Severin matter disease , secondary to capillary blood vessel changes. CD

## 2014-09-05 NOTE — Telephone Encounter (Signed)
Called pt to give lab results. No answer. Left message asking her to call me back.

## 2014-09-28 ENCOUNTER — Telehealth: Payer: Self-pay

## 2014-09-28 NOTE — Telephone Encounter (Signed)
Per Dr. Edwena Felty last note from visit 5/17, she wants to know about the CRP, sed rate, and ANA. I called Dr. Mathis Fare office and the latest labs on her were drawn in 09/2013. I asked that they would be faxed to me.  I called Dr. Elder Negus office and left a message with the nurse asking if there are labs on the patient to please fax them to me.

## 2014-10-04 ENCOUNTER — Ambulatory Visit (INDEPENDENT_AMBULATORY_CARE_PROVIDER_SITE_OTHER): Payer: Medicare Other | Admitting: Neurology

## 2014-10-04 ENCOUNTER — Encounter: Payer: Self-pay | Admitting: Neurology

## 2014-10-04 ENCOUNTER — Telehealth: Payer: Self-pay | Admitting: Neurology

## 2014-10-04 VITALS — BP 122/90 | HR 84 | Resp 20 | Ht 66.93 in | Wt 137.0 lb

## 2014-10-04 DIAGNOSIS — G4750 Parasomnia, unspecified: Secondary | ICD-10-CM

## 2014-10-04 DIAGNOSIS — I679 Cerebrovascular disease, unspecified: Secondary | ICD-10-CM

## 2014-10-04 DIAGNOSIS — I6789 Other cerebrovascular disease: Secondary | ICD-10-CM

## 2014-10-04 DIAGNOSIS — G3184 Mild cognitive impairment, so stated: Secondary | ICD-10-CM | POA: Diagnosis not present

## 2014-10-04 DIAGNOSIS — G478 Other sleep disorders: Secondary | ICD-10-CM

## 2014-10-04 HISTORY — DX: Parasomnia, unspecified: G47.50

## 2014-10-04 MED ORDER — ASPIRIN 75 MG PO CHEW: 75.0000 mg | CHEWABLE_TABLET | Freq: Every day | ORAL | Status: DC

## 2014-10-04 MED ORDER — CLONAZEPAM 0.25 MG PO TBDP: 0.2500 mg | ORAL_TABLET | Freq: Two times a day (BID) | ORAL | Status: DC

## 2014-10-04 NOTE — Progress Notes (Addendum)
Provider:  Larey Seat, M D  Referring Provider: Merrilee Seashore, MD Primary Care Physician:  Merrilee Seashore, MD  Chief Complaint  Patient presents with  . Follow-up    scotoma, rm 10, alone    HPI:  Kara Tapia is a 74 y.o. female seen here as a referral  from Dr. Ashby Dawes  And Dr Kathrin Penner for visual changes.  [name] is a right-handed Caucasian female that presents with 3 isolated episodes of visual scotomata. She states that she developed a severe migraine the first severe headache of this kind in her life first one occurred on March 17 of this year and 2 other spells have followed. The first time she was also nauseated but did not have to vomit. She was very photosensitive with all of these spells. The patient reports that on March 17 she experienced the following. In the afternoon, she had noticed an increased level of photosensitivity above report she usually feels. She was going to [employer] to prepare for tutoring or restored. She notice that she couldn't read or see very well and that she had actually blind spots in her vision words literally disappeared when she scanned the paper. This lasted several hours after the visual changes finally resolved she still had a headache and she resumed and went to bed. Sit-to-stand: Quiet and a core, quiet and dark room until she felt better. She did not use any medication. She called her ophthalmologist, Dr. Juanito Doom, and her  primary care physician, Dr. Ashby Dawes and was referred her here to neurologic care. Total weeks later she had another spell not as bad in terms of headache intensity nausea.no nausea without 1. She again had the scotomata. Then a third spell occurred 2 weeks ago. I was able to review the patient's medication list and her lab results from 10-26-13. Under her past medical history migraine headaches were noted .She was never diagnosed as migraines before Dr. Hal Neer  presumably diagnosed her  with a migraine, but the patient states she had no visual aura and she had no headaches were so spells just a crawling creepy crawling dysesthesia at the left side of the skull.  The patient states she tries to keep well hydrated, and she was neither sleep deprived nor did she have any unusual dietary or exercise activity- that she could identify that as a trigger for any of the 3 spells.  Interval history from 10-04-14. I see [name] today for a routine follow-up visit she recently went to the beach and she looks well rested also the scorching heat was not necessarily feeling good to her. She underwent an MRI study of the brain with and without contrast on 08-26-14 after suffering unusual headache spells which has not repeated since our last visit. The MRI showed scattered white matter changes most consistent with microvascular charges changes. A demyelinization would be considered less likely. A previous MRI from 7-16 2009 showed a progression of the small vessel lesions. Her brainstem is normal periventricular size is normal and she has normal mastoid and sinus air cells. Normal orbits. Her pituitary gland is of normal size and well visible. I would think that some of the microvascular changes could be related to severe migraine other risk factors are hypertension, BC these changes in a patient with diabetes occasionally The patient does not suffer from diabetes and be discussed today that she needs to keep hydrating, she likes caffeine aided coffee in the morning she should have with each  cup of coffee that she drinks a large glass of water. I will ask her to take a baby aspirin not daily but every other day because she tends to bruise easily and she has a scratching. Lab tests from July 2015 performed through Dr. Mathis Fare office were normal.  The patient  wanted to address with me something she encountered while at the beach - there she has sleep problems and often nightmarish dreams. She kicked  herself literally out of the bed and cut her leg in the process. She feels usually attacked or persecuted or threatened in her dreams and they seem to be REM behavior disorder or pavor nocturnus. She started once screaming on an airplane,  returning from [place].  She will feel the need to defend herself physically and act out her dreams in bed. She has not been sleep walking. Shee was a sleep walker as a child she reports.  She tried melatonin but felt that she actually had more of these spells while being on melatonin. He has never tried Klonopin or another benzodiazepine. I will prescribe the lowest dose for now. The spells occur 2-4 hours after falling asleep. She is not interested in SSRI.  she has no history supporting PTSD. The dreams are so very real to her, she is scared.      Review of Systems: Out of a complete 14 system review, the patient complains of only the following symptoms, and all other reviewed systems are negative. Insomnia, had 2 "normal sleep studies, she yells in her sleep, has nightmares, 5 years.  Some- times she  remembers her nightmares. REM behavior disorder. One happened in an airplane from [place] to the [place].   History   Social History  . Marital Status: Single    Spouse Name: N/A  . Number of Children: N/A  . Years of Education: N/A   Occupational History  . Not on file.   Social History Main Topics  . Smoking status: Current Every Day Smoker -- 0.50 packs/day    Types: Cigarettes  . Smokeless tobacco: Not on file  . Alcohol Use: 3.0 oz/week    5 Glasses of wine per week  . Drug Use: No  . Sexual Activity: Not on file   Other Topics Concern  . Not on file   Social History Narrative   Drinks 3-4 cups of caffeine daily.    Family History  Problem Relation Age of Onset  . Diabetes Mother   . Heart disease Mother   . Cancer Mother   . Cancer Father   . Lung disease Sister     Past Medical History  Diagnosis Date  . PVD (peripheral  vascular disease)   . Osteoarthritis   . Reflux esophagitis   . Migraine   . Arthritis   . Hematuria   . History of palpitations   . Migraine with status migrainosus 08/09/2014    Ocular migraines.    No past surgical history on file.  Current Outpatient Prescriptions  Medication Sig Dispense Refill  . eletriptan (RELPAX) 20 MG tablet Take 1 tablet (20 mg total) by mouth as needed for migraine or headache. May repeat in 2 hours if headache persists or recurs. 10 tablet 0   No current facility-administered medications for this visit.    Allergies as of 10/04/2014 - Review Complete 10/04/2014  Allergen Reaction Noted  . Caine-1 [lidocaine]  04/03/2013    Vitals: BP 122/90 mmHg  Pulse 84  Resp 20  Ht 5' 6.93" (  1.7 m)  Wt 137 lb (62.143 kg)  BMI 21.50 kg/m2 Last Weight:  Wt Readings from Last 1 Encounters:  10/04/14 137 lb (62.143 kg)   Last Height:   Ht Readings from Last 1 Encounters:  10/04/14 5' 6.93" (1.7 m)    Physical exam:  General: The patient is awake, alert and appears not in acute distress. The patient is well groomed. Head: Normocephalic, atraumatic. Neck is supple. Mallampati 4, neck circumference:14 Cardiovascular:  Regular rate and rhythm , without  murmurs or carotid bruit, and without distended neck veins. Respiratory: Lungs are clear to auscultation. Skin:  Without evidence of edema, or rash Trunk: BMI is elevated and patient  has normal posture.  Neurologic exam : The patient is awake and alert, oriented to place and time.  Memory subjective  described as intact.  There is a normal attention span & concentration ability. Speech is fluent without  dysarthria, only mild dysphonia , no  aphasia. Mood and affect are appropriate.  Cranial nerves: Pupils are equal and briskly reactive to light. Funduscopic exam without  evidence of pallor or edema. Extraocular movements  in vertical and horizontal planes intact and without nystagmus.  Visual fields by  finger perimetry are intact. Hearing to finger rub intact.  Facial sensation intact to fine touch. Facial motor strength is symmetric and tongue and uvula move midline. Tongue protrusion into either cheek is normal. Shoulder shrug is normal.   Motor exam:   Normal tone ,muscle bulk and symmetric  strength in all extremities.  Sensory:  Fine touch, pinprick and vibration were tested in all extremities. Proprioception was normal.  Coordination: Rapid alternating movements in the fingers/hands were normal. Finger-to-nose maneuver  normal without evidence of ataxia, dysmetria or tremor.  Gait and station: Patient walks without assistive device and is able unassisted to climb up to the exam table. Strength within normal limits. Stance is stable and normal. Tandem gait is unfragmented. Romberg testing is negative   Deep tendon reflexes: in the  upper and lower extremities are symmetric and intact. Babinski maneuver response is  downgoing.   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 She was a sleep walker in childhood and developed sleep yelling and thrashig about 5 years ago. Parasomnia , according to patient worsened with MELATONIN.  Memory loss, MCI.   Resolved :Ocular migraine, scotoma with resolution before headaches resolve. Nausea, photophobia.   Plan:  Treatment plan and additional workup : Formally called a complicated or neurologic migraine an ocular migraine is more often seen in female patients usually in young patients, usually this school trauma resolves after the headache resolved this is the opposite for this patient .  Rv in 6 month with Np or me , always 30 minutes for memory testing.  She had 3 spells in 2 months this is a new onset problem and she needs to be evaluated for any vascular abnormality, an  MRI of the brain is indicated in this case. Also we need to evaluate the visual pass specific specifically during this MRI .Dr. Kathrin Penner documented a scotoma doing a visual field test in her office.  I like for her to take 15 m of ASA nightly . May use 0.25 mg of KLONOPIN.   10-03-14 MOCA testing revealed more inattentiveness than memory loss, " scatterbrained"  26-30 points, lost 2 in recall and 2 in  sentence repeat.     Asencion Partridge Isra Lindy MD 10/04/2014

## 2014-10-04 NOTE — Telephone Encounter (Signed)
Pt called after appointment and said that the Dr. Michela Pitcher she would be started on medication. She did not leave with a Rx and is wondering if it was sent to her pharmacy? Please call and advise [phone]

## 2014-10-04 NOTE — Addendum Note (Signed)
Addended by: Larey Seat on: 10/04/2014 11:54 AM   Modules accepted: Orders

## 2014-10-04 NOTE — Telephone Encounter (Signed)
I informed pt that the klonopin was sent to our pharmacy tech and then she will send it to pt's pharmacy. Pt verbalized understanding.

## 2015-02-23 ENCOUNTER — Other Ambulatory Visit: Payer: Self-pay | Admitting: Neurology

## 2015-02-23 DIAGNOSIS — G3184 Mild cognitive impairment, so stated: Secondary | ICD-10-CM

## 2015-02-23 DIAGNOSIS — I6789 Other cerebrovascular disease: Secondary | ICD-10-CM

## 2015-02-23 DIAGNOSIS — I679 Cerebrovascular disease, unspecified: Secondary | ICD-10-CM

## 2015-02-23 DIAGNOSIS — G4750 Parasomnia, unspecified: Secondary | ICD-10-CM

## 2015-02-23 MED ORDER — CLONAZEPAM 0.25 MG PO TBDP: 0.2500 mg | ORAL_TABLET | Freq: Two times a day (BID) | ORAL | Status: DC

## 2015-02-23 NOTE — Telephone Encounter (Signed)
Rx signed and faxed.

## 2015-02-23 NOTE — Telephone Encounter (Signed)
Request entered, forwarded to provider for approval.  

## 2015-02-23 NOTE — Telephone Encounter (Signed)
Patient called to request refill of clonazePAM (KLONOPIN) 0.25 MG disintegrating tablet

## 2015-03-24 ENCOUNTER — Encounter (HOSPITAL_COMMUNITY): Payer: Self-pay | Admitting: *Deleted

## 2015-03-24 ENCOUNTER — Emergency Department (HOSPITAL_COMMUNITY): Payer: Medicare Other

## 2015-03-24 ENCOUNTER — Emergency Department (HOSPITAL_COMMUNITY)
Admission: EM | Admit: 2015-03-24 | Discharge: 2015-03-24 | Disposition: A | Discharge status: Home / Self Care | Payer: Medicare Other | Attending: Emergency Medicine | Admitting: Emergency Medicine

## 2015-03-24 DIAGNOSIS — Z79899 Other long term (current) drug therapy: Secondary | ICD-10-CM | POA: Diagnosis not present

## 2015-03-24 DIAGNOSIS — Z8659 Personal history of other mental and behavioral disorders: Secondary | ICD-10-CM | POA: Insufficient documentation

## 2015-03-24 DIAGNOSIS — W06XXXA Fall from bed, initial encounter: Secondary | ICD-10-CM | POA: Diagnosis not present

## 2015-03-24 DIAGNOSIS — S0990XA Unspecified injury of head, initial encounter: Secondary | ICD-10-CM | POA: Diagnosis present

## 2015-03-24 DIAGNOSIS — M1990 Unspecified osteoarthritis, unspecified site: Secondary | ICD-10-CM | POA: Insufficient documentation

## 2015-03-24 DIAGNOSIS — Y92003 Bedroom of unspecified non-institutional (private) residence as the place of occurrence of the external cause: Secondary | ICD-10-CM | POA: Insufficient documentation

## 2015-03-24 DIAGNOSIS — S161XXA Strain of muscle, fascia and tendon at neck level, initial encounter: Secondary | ICD-10-CM | POA: Diagnosis not present

## 2015-03-24 DIAGNOSIS — Y998 Other external cause status: Secondary | ICD-10-CM | POA: Diagnosis not present

## 2015-03-24 DIAGNOSIS — Z8719 Personal history of other diseases of the digestive system: Secondary | ICD-10-CM | POA: Insufficient documentation

## 2015-03-24 DIAGNOSIS — Z7982 Long term (current) use of aspirin: Secondary | ICD-10-CM | POA: Insufficient documentation

## 2015-03-24 DIAGNOSIS — R55 Syncope and collapse: Secondary | ICD-10-CM

## 2015-03-24 DIAGNOSIS — S060X1A Concussion with loss of consciousness of 30 minutes or less, initial encounter: Secondary | ICD-10-CM | POA: Diagnosis not present

## 2015-03-24 DIAGNOSIS — Y9384 Activity, sleeping: Secondary | ICD-10-CM | POA: Diagnosis not present

## 2015-03-24 DIAGNOSIS — Z8679 Personal history of other diseases of the circulatory system: Secondary | ICD-10-CM | POA: Diagnosis not present

## 2015-03-24 DIAGNOSIS — F17210 Nicotine dependence, cigarettes, uncomplicated: Secondary | ICD-10-CM | POA: Diagnosis not present

## 2015-03-24 DIAGNOSIS — G43909 Migraine, unspecified, not intractable, without status migrainosus: Secondary | ICD-10-CM | POA: Insufficient documentation

## 2015-03-24 HISTORY — DX: Sleep terrors (night terrors): F51.4

## 2015-03-24 LAB — CBC
HEMATOCRIT: 45.1 % (ref 36.0–46.0)
Hemoglobin: 15.3 g/dL — ABNORMAL HIGH (ref 12.0–15.0)
MCH: 32.8 pg (ref 26.0–34.0)
MCHC: 33.9 g/dL (ref 30.0–36.0)
MCV: 96.6 fL (ref 78.0–100.0)
PLATELETS: 255 10*3/uL (ref 150–400)
RBC: 4.67 MIL/uL (ref 3.87–5.11)
RDW: 12.7 % (ref 11.5–15.5)
WBC: 6.6 10*3/uL (ref 4.0–10.5)

## 2015-03-24 LAB — BASIC METABOLIC PANEL
ANION GAP: 9 (ref 5–15)
BUN: 12 mg/dL (ref 6–20)
CO2: 27 mmol/L (ref 22–32)
Calcium: 9.6 mg/dL (ref 8.9–10.3)
Chloride: 104 mmol/L (ref 101–111)
Creatinine, Ser: 0.86 mg/dL (ref 0.44–1.00)
GFR calc Af Amer: >60 mL/min (ref >60)
Glucose, Bld: 97 mg/dL (ref 65–99)
Potassium: 4.2 mmol/L (ref 3.5–5.1)
Sodium: 140 mmol/L (ref 135–145)

## 2015-03-24 LAB — CBG MONITORING, ED: Glucose-Capillary: 90 mg/dL (ref 65–99)

## 2015-03-24 MED ORDER — ACETAMINOPHEN 325 MG PO TABS
650.0000 mg | ORAL_TABLET | Freq: Once | ORAL | Status: AC
  Administered 2015-03-24: 650 mg via ORAL
  Filled 2015-03-24: qty 2

## 2015-03-24 NOTE — ED Provider Notes (Signed)
CSN: [id number]     Arrival date & time 03/24/15  0415 History   First MD Initiated Contact with Patient 03/24/15 [phone]     Chief Complaint  Patient presents with  . Loss of Consciousness     Patient is a 74 y.o. female presenting with syncope. The history is provided by the patient.  Loss of Consciousness Episode history:  Single Most recent episode:  Today Progression:  Improving Chronicity:  New Context comment:  While sleeping in bed Relieved by:  None tried Worsened by:  Nothing tried Associated symptoms: headaches   Associated symptoms: no chest pain, no fever, no focal weakness, no shortness of breath, no vomiting and no weakness   Risk factors: no coronary artery disease   pt reports she fell out of bed but does not recall the incident She reports she had difficulty sleeping and around 1:15am, took a klonopin and tried sleeping She later found herself in the floor around 3am and had two "knots" on her scalp She now has HA and neck pain No back pain No CP/SOB No focal weakness She had otherwise felt well prior to going to sleep  Past Medical History  Diagnosis Date  . PVD (peripheral vascular disease) ([place])   . Osteoarthritis   . Reflux esophagitis   . Migraine   . Arthritis   . Hematuria   . History of palpitations   . Migraine with status migrainosus 08/09/2014    Ocular migraines.  . Night terrors, adult    History reviewed. No pertinent past surgical history. Family History  Problem Relation Age of Onset  . Diabetes Mother   . Heart disease Mother   . Cancer Mother   . Cancer Father   . Lung disease Sister    Social History  Substance Use Topics  . Smoking status: Current Some Day Smoker -- 0.50 packs/day    Types: Cigarettes  . Smokeless tobacco: None  . Alcohol Use: 3.0 oz/week    5 Glasses of wine per week   OB History    No data available     Review of Systems  Constitutional: Negative for fever.  Respiratory: Negative for shortness of  breath.   Cardiovascular: Positive for syncope. Negative for chest pain.  Gastrointestinal: Negative for vomiting.  Musculoskeletal: Positive for neck pain. Negative for back pain.  Neurological: Positive for headaches. Negative for focal weakness and weakness.  All other systems reviewed and are negative.     Allergies  Caine-1  Home Medications   Prior to Admission medications   Medication Sig Start Date End Date Taking? Authorizing Provider  clonazePAM (KLONOPIN) 0.25 MG disintegrating tablet Take 1 tablet (0.25 mg total) by mouth 2 (two) times daily. 02/23/15  Yes Carmen Dohmeier, MD  OVER THE COUNTER MEDICATION Take 1 tablet by mouth at bedtime.   Yes Historical Provider, MD  aspirin 75 MG chewable tablet Chew 1 tablet (75 mg total) by mouth daily. Patient not taking: Reported on 03/24/2015 10/04/14   Larey Seat, MD  eletriptan (RELPAX) 20 MG tablet Take 1 tablet (20 mg total) by mouth as needed for migraine or headache. May repeat in 2 hours if headache persists or recurs. Patient not taking: Reported on 03/24/2015 08/09/14   Asencion Partridge Dohmeier, MD   BP 186/98 mmHg  Pulse 77  Temp(Src) 97.9 F (36.6 C) (Oral)  Resp 20  SpO2 100% Physical Exam CONSTITUTIONAL: Well developed/well nourished HEAD: soft tissue swelling to scalp, one on each parietal scalp, no other signs  of trauma EYES: EOMI/PERRL ENMT: Mucous membranes moist, No evidence of facial/nasal trauma NECK: c-collar in place SPINE/BACK:cervical spine tenderness.  No thoracic/lumbar tenderness.  No bruising/crepitance/stepoffs noted to spine CV: S1/S2 noted, no murmurs/rubs/gallops noted LUNGS: Lungs are clear to auscultation bilaterally, no apparent distress ABDOMEN: soft, nontender, no rebound or guarding, bowel sounds noted throughout abdomen GU:no cva tenderness NEURO: Pt is awake/alert/appropriate, moves all extremItiesx4.  No facial droop.  No arm/leg drift EXTREMITIES: pulses normal/equal, full ROM, All  extremities/joints palpated/ranged and nontender SKIN: warm, color normal PSYCH: no abnormalities of mood noted, alert and oriented to situation  ED Course  Procedures   5:32 AM Pt fell out of bed after taking klonopin She has HA and evidence of head injury She also has cspine tenderness Imaging ordered at this time 7:15 AM CT IMAGING NEGATIVE PT IMPROVED SHE IS AMBULATORY WITHOUT COMPLAINTS STABLE FOR D/C HOME SUSPECT THIS EPISODE RELATED TO [place] USE  Labs Review Labs Reviewed  CBC - Abnormal; Notable for the following:    Hemoglobin 15.3 (*)    All other components within normal limits  BASIC METABOLIC PANEL  URINALYSIS, ROUTINE W REFLEX MICROSCOPIC (NOT AT [facility])  CBG MONITORING, ED    Imaging Review Ct Head Wo Contrast  03/24/2015  CLINICAL DATA:  Acute onset of bilateral headache and neck pain. Woke up on the floor of the doorway of bedroom. Initial encounter. EXAM: CT HEAD WITHOUT CONTRAST CT CERVICAL SPINE WITHOUT CONTRAST TECHNIQUE: Multidetector CT imaging of the head and cervical spine was performed following the standard protocol without intravenous contrast. Multiplanar CT image reconstructions of the cervical spine were also generated. COMPARISON:  None. FINDINGS: CT HEAD FINDINGS There is no evidence of acute infarction, mass lesion, or intra- or extra-axial hemorrhage on CT. Mild prominence of the ventricles and sulci suggests mild cortical volume loss. Scattered periventricular and subcortical white matter change likely reflects small vessel ischemic microangiopathy. The brainstem and fourth ventricle are within normal limits. The basal ganglia are unremarkable in appearance. The cerebral hemispheres demonstrate grossly normal gray-white differentiation. No mass effect or midline shift is seen. There is no evidence of fracture; visualized osseous structures are unremarkable in appearance. The orbits are within normal limits. The paranasal sinuses and mastoid air  cells are well-aerated. Mild soft tissue swelling is noted overlying the left frontal calvarium. CT CERVICAL SPINE FINDINGS There is no evidence of acute fracture or subluxation. There is mild grade 1 anterolisthesis of C3 on C4, and multilevel disc space narrowing along the cervical spine, with scattered anterior and posterior disc osteophyte complexes. Vertebral bodies demonstrate normal height. Prevertebral soft tissues are within normal limits. A few scattered hyperdense nodules within the right thyroid lobe are likely benign, given their size. Scarring is noted at the lung apices, with associated blebs. No significant soft tissue abnormalities are seen. IMPRESSION: 1. No evidence of traumatic intracranial injury or fracture. 2. No evidence of acute fracture or subluxation along the cervical spine. 3. Mild soft tissue swelling overlying the left frontal calvarium. 4. Mild cortical volume loss and scattered small vessel microangiopathy. 5. Mild degenerative change noted along the cervical spine. 6. Scarring at the lung apices, with associated blebs. Electronically Signed   By: Garald Balding M.D.   On: 03/24/2015 05:52   Ct Cervical Spine Wo Contrast  03/24/2015  CLINICAL DATA:  Acute onset of bilateral headache and neck pain. Woke up on the floor of the doorway of bedroom. Initial encounter. EXAM: CT HEAD WITHOUT CONTRAST CT CERVICAL SPINE  WITHOUT CONTRAST TECHNIQUE: Multidetector CT imaging of the head and cervical spine was performed following the standard protocol without intravenous contrast. Multiplanar CT image reconstructions of the cervical spine were also generated. COMPARISON:  None. FINDINGS: CT HEAD FINDINGS There is no evidence of acute infarction, mass lesion, or intra- or extra-axial hemorrhage on CT. Mild prominence of the ventricles and sulci suggests mild cortical volume loss. Scattered periventricular and subcortical white matter change likely reflects small vessel ischemic  microangiopathy. The brainstem and fourth ventricle are within normal limits. The basal ganglia are unremarkable in appearance. The cerebral hemispheres demonstrate grossly normal gray-white differentiation. No mass effect or midline shift is seen. There is no evidence of fracture; visualized osseous structures are unremarkable in appearance. The orbits are within normal limits. The paranasal sinuses and mastoid air cells are well-aerated. Mild soft tissue swelling is noted overlying the left frontal calvarium. CT CERVICAL SPINE FINDINGS There is no evidence of acute fracture or subluxation. There is mild grade 1 anterolisthesis of C3 on C4, and multilevel disc space narrowing along the cervical spine, with scattered anterior and posterior disc osteophyte complexes. Vertebral bodies demonstrate normal height. Prevertebral soft tissues are within normal limits. A few scattered hyperdense nodules within the right thyroid lobe are likely benign, given their size. Scarring is noted at the lung apices, with associated blebs. No significant soft tissue abnormalities are seen. IMPRESSION: 1. No evidence of traumatic intracranial injury or fracture. 2. No evidence of acute fracture or subluxation along the cervical spine. 3. Mild soft tissue swelling overlying the left frontal calvarium. 4. Mild cortical volume loss and scattered small vessel microangiopathy. 5. Mild degenerative change noted along the cervical spine. 6. Scarring at the lung apices, with associated blebs. Electronically Signed   By: Garald Balding M.D.   On: 03/24/2015 05:52   I have personally reviewed and evaluated these lab results as part of my medical decision-making.   EKG Interpretation   Date/Time:  Friday March 24 2015 04:25:16 EST Ventricular Rate:  74 PR Interval:  176 QRS Duration: 96 QT Interval:  398 QTC Calculation: 442 R Axis:   41 Text Interpretation:  Sinus rhythm RSR' in V1 or V2, probably normal  variant Baseline wander  in lead(s) V1 No significant change since last  tracing Confirmed by [place] ([id number]) on 03/24/2015 4:47:31 AM      MDM   Final diagnoses:  Syncope, unspecified syncope type  Concussion, with loss of consciousness of 30 minutes or less, initial encounter  Cervical strain, acute, initial encounter    Nursing notes including past medical history and social history reviewed and considered in documentation Labs/vital reviewed myself and considered during evaluation     Ripley Fraise, MD 03/24/15 [phone]

## 2015-03-24 NOTE — ED Notes (Signed)
Pt states that she woke up around 3am and was in the doorway of her bedroom; pt states that she remembers being awake at 1:15am but does not remember waking up, getting out of bed or falling; pt c/o head pain; pt states that she feels soreness to both sides of her head; pt c/o neck pain; pt c/o feeling "shakey"; pt denies hx of hypoglycemia; pt states that she has had nightmares in the past and that has woken her up and fallen out of bed put never gotten up out of the bed and not remembered.

## 2015-03-24 NOTE — Discharge Instructions (Signed)
You have had a head injury which does not appear to require admission at this time. A concussion is a state of changed mental ability from trauma. ° °SEEK IMMEDIATE MEDICAL ATTENTION IF: °There is confusion or drowsiness (although children frequently become drowsy after injury).  °You cannot awaken the injured person.  °There is nausea (feeling sick to your stomach) or continued, forceful vomiting.  °You notice dizziness or unsteadiness which is getting worse, or inability to walk.  °You have convulsions or unconsciousness.  °You experience severe, persistent headaches not relieved by Tylenol. (Do not take aspirin as this impairs clotting abilities). Take other pain medications only as directed.  °You cannot use arms or legs normally.  °There are changes in pupil sizes. (This is the black center in the colored part of the eye)  °There is clear or bloody discharge from the nose or ears.  °Change in speech, vision, swallowing, or understanding.  °Localized weakness, numbness, tingling, or change in bowel or bladder control. ° °You have neck pain, possibly from a cervical strain and/or pinched nerve.  ° °SEEK IMMEDIATE MEDICAL ATTENTION IF: °You develop difficulties swallowing or breathing.  °You have new or worse numbness, weakness, tingling, or movement problems in your arms or legs.  °You develop increasing pain which is uncontrolled with medications.  °You have change in bowel or bladder function, or other concerns. ° ° °

## 2015-03-29 ENCOUNTER — Telehealth: Payer: Self-pay | Admitting: Adult Health

## 2015-03-29 NOTE — Telephone Encounter (Signed)
Pt called sts she had a fainting spell 03/24/15 hitting her head on corner of table and frame of door. Said she had knots on both sides of head, laceration behind rt ear up toward scalp. Pt woke up on the floor at 3am, she does not remember anything. She took a avinol (all natural) for sleep at 10pm, at 1:30 she was still not asleep and took clonazepam .25. Since then she has not taken anything for sleep. Please call

## 2015-03-30 NOTE — Telephone Encounter (Signed)
Spoke to pt. She verified all that was mentioned in the previous message. She just wanted [place] to know this before her appt with [facility]. She wanted to know if she should still keep this appt with Jinny Blossom since she was just seen in the ED and evaluated neurologically? I advised her that our office should still evaluate her and that Dr. Brett Fairy did recommend a 6 month follow up at her last visit, which would early be January 2017, as scheduled.  Pt is agreeable to following up with [facility] as directed. Pt does not need anything further from [place] and denies any other questions or concerns.

## 2015-04-04 ENCOUNTER — Encounter (INDEPENDENT_AMBULATORY_CARE_PROVIDER_SITE_OTHER): Payer: Self-pay

## 2015-04-04 ENCOUNTER — Ambulatory Visit (INDEPENDENT_AMBULATORY_CARE_PROVIDER_SITE_OTHER): Payer: Medicare Other | Admitting: Adult Health

## 2015-04-04 ENCOUNTER — Encounter: Payer: Self-pay | Admitting: Adult Health

## 2015-04-04 VITALS — BP 135/87 | HR 80 | Ht 66.0 in | Wt 136.5 lb

## 2015-04-04 DIAGNOSIS — R413 Other amnesia: Secondary | ICD-10-CM | POA: Diagnosis not present

## 2015-04-04 DIAGNOSIS — G4750 Parasomnia, unspecified: Secondary | ICD-10-CM

## 2015-04-04 NOTE — Progress Notes (Signed)
I agree with the assessment and plan as directed by NP .The patient is known to me .   Christerpher Clos, MD  

## 2015-04-04 NOTE — Progress Notes (Signed)
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 She returns today for follow-up. At the last visit she was given Klonopin to help prevent visit dreams. The patient states that she's been taking this intermittently. She states that it does not consistently help with her dreams and she primarily takes it to help her fall asleep. She states that on December 29 she took Klonopin in addition to an over-the-counter substance called Avinol. Avinol contains melatonin she states that she took this is approximately 10 PM and then woke up and took Klonopin at 1:30 AM. She states that she woke up at 3:00 AM and she will was in the floor. She does not recall how she got there. She did hit her head and had a laceration on the back of the head as well as a black eye. She did go to the ED for an evaluation. She states that this has not happened before. She is unsure what happened. Patient states that she has a hard time falling and staying asleep. She states this been a chronic issue ever since she was a child. She denies any changes with her memory. She does report that when she was younger and on a hiking trip she had  heat stroke and since then she's noticed that her memory has not been the same. She does live alone. She is able to complete all ADLs independently. She operates a Teacher, music without difficulty. She returns today for an evaluation.  HISTORY 10/04/2014 [facility]):  Kara Tapia is a 75 y.o. female seen here as a referral from Dr. Ashby Dawes And Dr Kathrin Penner for visual changes.  Kara Tapia is a right-handed Caucasian female that presents with 3 isolated episodes of visual scotomata. She states that she developed a severe migraine the first  severe headache of this kind in her life first one occurred on March 17 of this year and 2 other spells have followed. The first time she was also nauseated but did not have to vomit. She was very photosensitive with all of these spells. The patient reports that on March 17 she experienced the following. In the afternoon, she had noticed an increased level of photosensitivity above report she usually feels. She was going to [employer] to prepare for tutoring or restored. She notice that she couldn't read or see very well and that she had actually blind spots in her vision words literally disappeared when she scanned the paper. This lasted several hours after the visual changes finally resolved she still had a headache and she resumed and went to bed. Sit-to-stand: Quiet and a core, quiet and dark room until she felt better. She did not use any medication. She called her ophthalmologist, Dr. Juanito Doom, and her primary care physician, Dr. Ashby Dawes and was referred her here to neurologic care. Total weeks later she had another spell not as bad in terms of headache intensity nausea.no nausea without 1. She again had the scotomata. Then a third spell occurred 2 weeks ago. I was able to review the patient's medication list and her lab results from 10-26-13. Under her past medical history migraine headaches were noted .She was never diagnosed as migraines before Dr. Hal Neer presumably diagnosed her with a migraine, but the patient states she had no visual aura and she  had no headaches were so spells just a crawling creepy crawling dysesthesia at the left side of the skull.  The patient states she tries to keep well hydrated, and she was neither sleep deprived nor did she have any unusual dietary or exercise activity- that she could identify that as a trigger for any of the 3 spells.  Interval history from 10-04-14. I see Kara Tapia today for a routine follow-up visit she recently went to the beach and she  looks well rested also the scorching heat was not necessarily feeling good to her. She underwent an MRI study of the brain with and without contrast on 08-26-14 after suffering unusual headache spells which has not repeated since our last visit. The MRI showed scattered white matter changes most consistent with microvascular charges changes. A demyelinization would be considered less likely. A previous MRI from 7-16 2009 showed a progression of the small vessel lesions. Her brainstem is normal periventricular size is normal and she has normal mastoid and sinus air cells. Normal orbits. Her pituitary gland is of normal size and well visible. I would think that some of the microvascular changes could be related to severe migraine other risk factors are hypertension, BC these changes in a patient with diabetes occasionally The patient does not suffer from diabetes and be discussed today that she needs to keep hydrating, she likes caffeine aided coffee in the morning she should have with each cup of coffee that she drinks a large glass of water. I will ask her to take a baby aspirin not daily but every other day because she tends to bruise easily and she has a scratching. Lab tests from July 2015 performed through Dr. Mathis Fare office were normal.  The patient wanted to address with me something she encountered while at the beach - there she has sleep problems and often nightmarish dreams. She kicked herself literally out of the bed and cut her leg in the process. She feels usually attacked or persecuted or threatened in her dreams and they seem to be REM behavior disorder or pavor nocturnus. She started once screaming on an airplane, returning from [place].  She will feel the need to defend herself physically and act out her dreams in bed. She has not been sleep walking. Shee was a sleep walker as a child she reports.  She tried melatonin but felt that she actually had more of these spells while being on  melatonin. He has never tried Klonopin or another benzodiazepine. I will prescribe the lowest dose for now. The spells occur 2-4 hours after falling asleep. She is not interested in SSRI. she has no history supporting PTSD. The dreams are so very real to her, she is scared.    REVIEW OF SYSTEMS: Out of a complete 14 system review of symptoms, the patient complains only of the following symptoms, and all other reviewed systems are negative.  Blurred vision, restless leg, insomnia, sleep talking, headache, passing out  ALLERGIES: Allergies  Allergen Reactions  . Caine-1 [Lidocaine] Other (See Comments)    Affects her BP    HOME MEDICATIONS: Outpatient Prescriptions Prior to Visit  Medication Sig Dispense Refill  . aspirin 75 MG chewable tablet Chew 1 tablet (75 mg total) by mouth daily. (Patient taking differently: Chew 75 mg by mouth daily as needed. ) 30 tablet 5  . eletriptan (RELPAX) 20 MG tablet Take 1 tablet (20 mg total) by mouth as needed for migraine or headache. May repeat in 2 hours if headache  persists or recurs. (Patient not taking: Reported on 03/24/2015) 10 tablet 0  . OVER THE COUNTER MEDICATION Take 1 tablet by mouth at bedtime. Reported on 04/04/2015    . clonazePAM (KLONOPIN) 0.25 MG disintegrating tablet Take 1 tablet (0.25 mg total) by mouth 2 (two) times daily. (Patient not taking: Reported on 04/04/2015) 60 tablet 1   No facility-administered medications prior to visit.    PAST MEDICAL HISTORY: Past Medical History  Diagnosis Date  . PVD (peripheral vascular disease) ([place])   . Osteoarthritis   . Reflux esophagitis   . Migraine   . Arthritis   . Hematuria   . History of palpitations   . Migraine with status migrainosus 08/09/2014    Ocular migraines.  . Night terrors, adult     PAST SURGICAL HISTORY: History reviewed. No pertinent past surgical history.  FAMILY HISTORY: Family History  Problem Relation Age of Onset  . Diabetes Mother   . Heart  disease Mother   . Cancer Mother   . Cancer Father   . Lung disease Sister     SOCIAL HISTORY: Social History   Social History  . Marital Status: Single    Spouse Name: N/A  . Number of Children: N/A  . Years of Education: N/A   Occupational History  . Not on file.   Social History Main Topics  . Smoking status: Current Some Day Smoker -- 0.50 packs/day    Types: Cigarettes  . Smokeless tobacco: Not on file  . Alcohol Use: 3.0 oz/week    5 Glasses of wine per week  . Drug Use: No  . Sexual Activity: Not on file   Other Topics Concern  . Not on file   Social History Narrative   Drinks 3-4 cups of caffeine daily.      PHYSICAL EXAM  Filed Vitals:   04/04/15 1047  BP: 135/87  Pulse: 80  Height: 5\' 6"  (1.676 m)  Weight: 136 lb 8 oz (61.916 kg)   Body mass index is 22.04 kg/(m^2).  Montreal Cognitive Assessment  04/04/2015  Visuospatial/ Executive (0/5) 5  Naming (0/3) 3  Attention: Read list of digits (0/2) 1  Attention: Read list of letters (0/1) 1  Attention: Serial 7 subtraction starting at 100 (0/3) 2  Language: Repeat phrase (0/2) 1  Language : Fluency (0/1) 1  Abstraction (0/2) 1  Delayed Recall (0/5) 1  Orientation (0/6) 6  Total 22     Generalized: Well developed, in no acute distress   Neurological examination  Mentation: Alert oriented to time, place, history taking. Follows all commands speech and language fluent Cranial nerve II-XII: Pupils were equal round reactive to light. Extraocular movements were full, visual field were full on confrontational test. Facial sensation and strength were normal. Uvula tongue midline. Head turning and shoulder shrug  were normal and symmetric. Motor: The motor testing reveals 5 over 5 strength of all 4 extremities. Good symmetric motor tone is noted throughout.  Sensory: Sensory testing is intact to soft touch on all 4 extremities. No evidence of extinction is noted.  Coordination: Cerebellar testing reveals  good finger-nose-finger and heel-to-shin bilaterally.  Gait and station: Gait is normal. Tandem gait is normal. Romberg is negative. No drift is seen.  Reflexes: Deep tendon reflexes are symmetric and normal bilaterally.   DIAGNOSTIC DATA (LABS, IMAGING, TESTING) - I reviewed patient records, labs, notes, testing and imaging myself where available.  Lab Results  Component Value Date   WBC 6.6 03/24/2015  HGB 15.3* 03/24/2015   HCT 45.1 03/24/2015   MCV 96.6 03/24/2015   PLT 255 03/24/2015      Component Value Date/Time   NA 140 03/24/2015 0502   K 4.2 03/24/2015 0502   CL 104 03/24/2015 0502   CO2 27 03/24/2015 0502   GLUCOSE 97 03/24/2015 0502   BUN 12 03/24/2015 0502   CREATININE 0.86 03/24/2015 0502   CALCIUM 9.6 03/24/2015 0502   PROT 7.2 05/13/2011 1424   ALBUMIN 4.2 05/13/2011 1424   AST 24 05/13/2011 1424   ALT 16 05/13/2011 1424   ALKPHOS 80 05/13/2011 1424   BILITOT 0.5 05/13/2011 1424   GFRNONAA >60 03/24/2015 0502   GFRAA >60 03/24/2015 0502      ASSESSMENT AND PLAN 75 y.o. year old female  has a past medical history of PVD (peripheral vascular disease) ([place]); Osteoarthritis; Reflux esophagitis; Migraine; Arthritis; Hematuria; History of palpitations; Migraine with status migrainosus (08/09/2014); and Night terrors, adult. here with:  1. Parasomnia 2. Memory disturbance  The patient will discontinue the over-the-counter supplement as well as Klonopin. We will monitor how this effects her sleep. The patient's memory score is slightly decreased. MOCA today is 22/30 was previously 26/30. We will continue to monitor his memory. In the future we may consider a memory medication such as Aricept or Namenda. Patient will return in one month to evaluate her sleep. Patient is amenable to this plan. She will contact us if any new issues arise  Ward Givens, MSN, NP-C 04/04/2015, 11:04 AM [facility] [address] [place], [place]  [address] [phone]

## 2015-04-04 NOTE — Patient Instructions (Signed)
Stop Klonopin and OTC supplement We will continue to monitor memory If your symptoms worsen or you develop new symptoms please let us know.

## 2015-05-08 ENCOUNTER — Ambulatory Visit: Payer: Medicare Other | Admitting: Neurology

## 2015-05-22 ENCOUNTER — Ambulatory Visit (INDEPENDENT_AMBULATORY_CARE_PROVIDER_SITE_OTHER): Payer: Medicare Other | Admitting: Neurology

## 2015-05-22 ENCOUNTER — Encounter: Payer: Self-pay | Admitting: Neurology

## 2015-05-22 VITALS — BP 134/92 | HR 78 | Resp 20 | Ht 65.0 in | Wt 134.0 lb

## 2015-05-22 DIAGNOSIS — G478 Other sleep disorders: Secondary | ICD-10-CM | POA: Diagnosis not present

## 2015-05-22 DIAGNOSIS — G4700 Insomnia, unspecified: Secondary | ICD-10-CM

## 2015-05-22 DIAGNOSIS — G4750 Parasomnia, unspecified: Secondary | ICD-10-CM

## 2015-05-22 MED ORDER — ALPRAZOLAM 0.25 MG PO TABS: 0.2500 mg | ORAL_TABLET | Freq: Every evening | ORAL | Status: DC | PRN

## 2015-05-22 MED ORDER — ESCITALOPRAM OXALATE 10 MG PO TABS: 5.0000 mg | ORAL_TABLET | Freq: Every day | ORAL | Status: DC

## 2015-05-22 NOTE — Progress Notes (Signed)
PATIENT: Kara Tapia DOB: 29-Oct-1940  REASON FOR VISIT: follow up- parasomnia, memory disturbance, cerebral microvascular diseasw HISTORY FROM: patient  HISTORY OF PRESENT ILLNESS: Kara Tapia is a 75 year old female with a history of parasomnia, memory disturbance in cerebral microvascular disease. She returns today for follow-up. At the last visit she was given Klonopin to help prevent visit dreams. The patient states that she's been taking this intermittently. She states that it does not consistently help with her dreams and she primarily takes it to help her fall asleep. She states that on December 29 she took Klonopin in addition to an over-the-counter substance called Avinol. Avinol contains melatonin she states that she took this is approximately 10 PM and then woke up and took Klonopin at 1:30 AM. She states that she woke up at 3:00 AM and she will was in the floor. She does not recall how she got there. She did hit her head and had a laceration on the back of the head as well as a black eye. She did go to the ED for an evaluation. She states that this has not happened before. She is unsure what happened. Patient states that she has a hard time falling and staying asleep. She states this been a chronic issue ever since she was a child. She denies any changes with her memory. She does report that when she was younger and on a hiking trip she had  heat stroke and since then she's noticed that her memory has not been the same. She does live alone. She is able to complete all ADLs independently. She operates a Teacher, music without difficulty. She returns today for an evaluation.  HISTORY 10/04/2014 [facility]):  Kara Tapia is a 75 y.o. female seen here as a referral from Kara Tapia And Dr Kathrin Tapia for visual changes.  [name] is a right-handed Caucasian female that presents with 3 isolated episodes of visual scotomata. She states that she developed a severe migraine the first  severe headache of this kind in her life first one occurred on March 17 of this year and 2 other spells have followed. The first time she was also nauseated but did not have to vomit. She was very photosensitive with all of these spells. The patient reports that on March 17 she experienced the following. In the afternoon, she had noticed an increased level of photosensitivity above report she usually feels. She was going to [employer] to prepare for tutoring or restored. She notice that she couldn't read or see very well and that she had actually blind spots in her vision words literally disappeared when she scanned the paper. This lasted several hours after the visual changes finally resolved she still had a headache and she resumed and went to bed. Sit-to-stand: Quiet and a core, quiet and dark room until she felt better. She did not use any medication. She called her ophthalmologist, Kara Tapia, and her primary care physician, Kara Tapia and was referred her here to neurologic care. Total weeks later she had another spell not as bad in terms of headache intensity nausea.no nausea without 1. She again had the scotomata. Then a third spell occurred 2 weeks ago. I was able to review the patient's medication list and her lab results from 10-26-13. Under her past medical history migraine headaches were noted .She was never diagnosed as migraines before Kara Tapia presumably diagnosed her with a migraine, but the patient states she had no visual aura and she  had no headaches were so spells just a crawling creepy crawling dysesthesia at the left side of the skull.  The patient states she tries to keep well hydrated, and she was neither sleep deprived nor did she have any unusual dietary or exercise activity- that she could identify that as a trigger for any of the 3 spells.  Interval history from 10-04-14. I see Kara Tapia today for a routine follow-up visit she recently went to the beach and she  looks well rested also the scorching heat was not necessarily feeling good to her. She underwent an MRI study of the brain with and without contrast on 08-26-14 after suffering unusual headache spells which has not repeated since our last visit. The MRI showed scattered white matter changes most consistent with microvascular charges changes. A demyelinization would be considered less likely. A previous MRI from 7-16 2009 showed a progression of the small vessel lesions. Her brainstem is normal periventricular size is normal and she has normal mastoid and sinus air cells. Normal orbits. Her pituitary gland is of normal size and well visible. I would think that some of the microvascular changes could be related to severe migraine other risk factors are hypertension, BC these changes in a patient with diabetes occasionally The patient does not suffer from diabetes and be discussed today that she needs to keep hydrating, she likes caffeine aided coffee in the morning she should have with each cup of coffee that she drinks a large glass of water. I will ask her to take a baby aspirin not daily but every other day because she tends to bruise easily and she has a scratching. Lab tests from July 2015 performed through Kara Tapia office were normal.  The patient wanted to address with me something she encountered while at the beach - there she has sleep problems and often nightmarish dreams. She kicked herself literally out of the bed and cut her leg in the process. She feels usually attacked or persecuted or threatened in her dreams and they seem to be REM behavior disorder or pavor nocturnus. She started once screaming on an airplane, returning from [place].  She will feel the need to defend herself physically and act out her dreams in bed. She has not been sleep walking. Shee was a sleep walker as a child she reports.  She tried melatonin but felt that she actually had more of these spells while being on  melatonin. He has never tried Klonopin or another benzodiazepine. I will prescribe the lowest dose for now. The spells occur 2-4 hours after falling asleep. She is not interested in SSRI. she has no history supporting PTSD. The dreams are so very real to her, she is scared.   05-22-15 She had a heat stroke and attributed to this event her memory loss, and loss of certain skills.  Montreal Cognitive Assessment  05/22/2015 04/04/2015  Visuospatial/ Executive (0/5) 5 5  Naming (0/3) 3 3  Attention: Read list of digits (0/2) 2 1  Attention: Read list of letters (0/1) 1 1  Attention: Serial 7 subtraction starting at 100 (0/3) 1 2  Language: Repeat phrase (0/2) 2 1  Language : Fluency (0/1) 1 1  Abstraction (0/2) 2 1  Delayed Recall (0/5) 3 1  Orientation (0/6) 6 6  Total 26 22  Adjusted Score (based on education) 26 -     2 sleep studies in the past documented neither parasomnia, not apnea. INSOMNIA. Epworth 2 points.  She sleeps better on  her sofa than on her bed.  Wakes up early and has difficulties to re-initiate sleep. Serotonin medication was offered and declined.  Today, she is willing to try amytriptyline or lexapro. Melatonin caused night-mares.     REVIEW OF SYSTEMS: Out of a complete 14 system review of symptoms, the patient complains only of the following symptoms, and all other reviewed systems are negative. Blurred vision, restless leg, insomnia, sleep talking, headache, passing out  ALLERGIES: Allergies  Allergen Reactions  . Caine-1 [Lidocaine] Other (See Comments)    Affects her BP    HOME MEDICATIONS: Outpatient Prescriptions Prior to Visit  Medication Sig Dispense Refill  . aspirin 75 MG chewable tablet Chew 1 tablet (75 mg total) by mouth daily. (Patient taking differently: Chew 75 mg by mouth daily as needed. ) 30 tablet 5  . eletriptan (RELPAX) 20 MG tablet Take 1 tablet (20 mg total) by mouth as needed for migraine or headache. May repeat in 2 hours if headache  persists or recurs. 10 tablet 0  . OVER THE COUNTER MEDICATION Take 1 tablet by mouth at bedtime. Reported on 04/04/2015     No facility-administered medications prior to visit.    PAST MEDICAL HISTORY: Past Medical History  Diagnosis Date  . PVD (peripheral vascular disease) ([place])   . Osteoarthritis   . Reflux esophagitis   . Migraine   . Arthritis   . Hematuria   . History of palpitations   . Migraine with status migrainosus 08/09/2014    Ocular migraines.  . Night terrors, adult     PAST SURGICAL HISTORY: History reviewed. No pertinent past surgical history.  FAMILY HISTORY: Family History  Problem Relation Age of Onset  . Diabetes Mother   . Heart disease Mother   . Cancer Mother   . Cancer Father   . Lung disease Sister     SOCIAL HISTORY: Social History   Social History  . Marital Status: Single    Spouse Name: N/A  . Number of Children: N/A  . Years of Education: N/A   Occupational History  . Not on file.   Social History Main Topics  . Smoking status: Current Some Day Smoker -- 0.50 packs/day    Types: Cigarettes  . Smokeless tobacco: Never Used  . Alcohol Use: 3.0 oz/week    5 Glasses of wine per week  . Drug Use: No  . Sexual Activity: Not on file   Other Topics Concern  . Not on file   Social History Narrative   Drinks 3-4 cups of caffeine daily.      PHYSICAL EXAM  Filed Vitals:   05/22/15 1059  BP: 134/92  Pulse: 78  Resp: 20  Height: 5\' 5"  (1.651 m)  Weight: 134 lb (60.782 kg)   Body mass index is 22.3 kg/(m^2).  Montreal Cognitive Assessment  05/22/2015 04/04/2015  Visuospatial/ Executive (0/5) 5 5  Naming (0/3) 3 3  Attention: Read list of digits (0/2) 2 1  Attention: Read list of letters (0/1) 1 1  Attention: Serial 7 subtraction starting at 100 (0/3) 1 2  Language: Repeat phrase (0/2) 2 1  Language : Fluency (0/1) 1 1  Abstraction (0/2) 2 1  Delayed Recall (0/5) 3 1  Orientation (0/6) 6 6  Total 26 22  Adjusted Score  (based on education) 26 -     Generalized: Well developed, in no acute distress   Neurological examination  Mentation: Alert oriented to time, place, history taking. Follows all commands speech  and language fluent Cranial nerve II-XII: Pupils were equal round reactive to light. Extraocular movements were full, visual field were full on confrontational test. Facial sensation and strength were normal. Uvula tongue midline. Head turning and shoulder shrug  were normal and symmetric. Motor: The motor testing reveals 5 over 5 strength of all 4 extremities. Good symmetric motor tone is noted throughout.  Sensory: Sensory testing is intact to soft touch on all 4 extremities. No evidence of extinction is noted.  Coordination: Cerebellar testing reveals good finger-nose-finger and heel-to-shin bilaterally.  Gait and station: Gait is normal. Tandem gait is normal. Romberg is negative. No drift is seen.  Reflexes: Deep tendon reflexes are symmetric and normal bilaterally.   DIAGNOSTIC DATA (LABS, IMAGING, TESTING) - I reviewed patient records, labs, notes, testing and imaging myself where available.  Lab Results  Component Value Date   WBC 6.6 03/24/2015   HGB 15.3* 03/24/2015   HCT 45.1 03/24/2015   MCV 96.6 03/24/2015   PLT 255 03/24/2015      Component Value Date/Time   NA 140 03/24/2015 0502   K 4.2 03/24/2015 0502   CL 104 03/24/2015 0502   CO2 27 03/24/2015 0502   GLUCOSE 97 03/24/2015 0502   BUN 12 03/24/2015 0502   CREATININE 0.86 03/24/2015 0502   CALCIUM 9.6 03/24/2015 0502   PROT 7.2 05/13/2011 1424   ALBUMIN 4.2 05/13/2011 1424   AST 24 05/13/2011 1424   ALT 16 05/13/2011 1424   ALKPHOS 80 05/13/2011 1424   BILITOT 0.5 05/13/2011 1424   GFRNONAA >60 03/24/2015 0502   GFRAA >60 03/24/2015 0502      ASSESSMENT AND PLAN 75 y.o. year old female  has a past medical history of PVD (peripheral vascular disease) ([place]); Osteoarthritis; Reflux esophagitis; Migraine; Arthritis;  Hematuria; History of palpitations; Migraine with status migrainosus (08/09/2014); and Night terrors, adult. here with:  0. Jaw pain after abscess surgery ( and even more before ) . I presume this was the source/ cause of headaches when I saw her at first.  1. Parasomnia- vivid dreams , acting out . Last occurrence in early feb. 2017. I 2. nsomnia , waking up early.  3. Memory disturbance. See [place]   The patient will discontinue the over-the-counter supplement as well as Klonopin. We will monitor how this effects her sleep.  The patient's memory score is slightly decreased. MOCA today is 26/30 was previously 22/30. We will continue to monitor his memory- this may be a lewy body dementia- day by day variance. .  In the future we may consider a memory medication such as Aricept  She fears side effects and is not in favour of this.  Patient is amenable to this plan. She will contact us if any new issues arise  05/22/2015, 11:20 AM [facility] [address], [place], [place] [address] [phone]

## 2015-05-22 NOTE — Patient Instructions (Addendum)
Please remember to try to maintain good sleep hygiene, which means: Keep a regular sleep and wake schedule, try not to exercise or have a meal within 2 hours of your bedtime, try to keep your bedroom conducive for sleep, that is, cool and dark, without light distractors such as an illuminated alarm clock, and refrain from watching TV right before sleep or in the middle of the night and do not keep the TV or radio on during the night. Also, try not to use or play on electronic devices at bedtime, such as your cell phone, tablet PC or laptop. If you like to read at bedtime on an electronic device, try to dim the background light as much as possible. Do not eat in the middle of the night.   We will request a sleep study-    We will look for leg twitching and snoring or sleep apnea.   For chronic insomnia, you are best followed by a psychiatrist and/or sleep psychologist.   We will call you with the sleep study results and make a follow up appointment if needed.    Xanax is For PRN use . Started Lexapro at 1/2 tab of a 10 mg tablet.   I want  you to have a sleep test in 4-6 weeks to evaluate for organic parasomnia  and insomnia .    Escitalopram tablets What is this medicine? ESCITALOPRAM (es sye TAL oh pram) is used to treat depression and certain types of anxiety. This medicine may be used for other purposes; ask your health care provider or pharmacist if you have questions. What should I tell my health care provider before I take this medicine? They need to know if you have any of these conditions: -bipolar disorder or a family history of bipolar disorder -diabetes -glaucoma -heart disease -kidney or liver disease -receiving electroconvulsive therapy -seizures (convulsions) -suicidal thoughts, plans, or attempt by you or a family member -an unusual or allergic reaction to escitalopram, the related drug citalopram, other medicines, foods, dyes, or preservatives -pregnant or trying to  become pregnant -breast-feeding How should I use this medicine? Take this medicine by mouth with a glass of water. Follow the directions on the prescription label. You can take it with or without food. If it upsets your stomach, take it with food. Take your medicine at regular intervals. Do not take it more often than directed. Do not stop taking this medicine suddenly except upon the advice of your doctor. Stopping this medicine too quickly may cause serious side effects or your condition may worsen. A special MedGuide will be given to you by the pharmacist with each prescription and refill. Be sure to read this information carefully each time. Talk to your pediatrician regarding the use of this medicine in children. Special care may be needed. Overdosage: If you think you have taken too much of this medicine contact a poison control center or emergency room at once. NOTE: This medicine is only for you. Do not share this medicine with others. What if I miss a dose? If you miss a dose, take it as soon as you can. If it is almost time for your next dose, take only that dose. Do not take double or extra doses. What may interact with this medicine? Do not take this medicine with any of the following medications: -certain medicines for fungal infections like fluconazole, itraconazole, ketoconazole, posaconazole, voriconazole -cisapride -citalopram -dofetilide -dronedarone -linezolid -MAOIs like Carbex, Eldepryl, Marplan, Nardil, and Parnate -methylene blue (injected into  a vein) -pimozide -thioridazine -ziprasidone This medicine may also interact with the following medications: -alcohol -aspirin and aspirin-like medicines -carbamazepine -certain medicines for depression, anxiety, or psychotic disturbances -certain medicines for migraine headache like almotriptan, eletriptan, frovatriptan, naratriptan, rizatriptan, sumatriptan, zolmitriptan -certain medicines for sleep -certain medicines  that treat or prevent blood clots like warfarin, enoxaparin, dalteparin -cimetidine -diuretics -fentanyl -furazolidone -isoniazid -lithium -metoprolol -NSAIDs, medicines for pain and inflammation, like ibuprofen or naproxen -other medicines that prolong the QT interval (cause an abnormal heart rhythm) -procarbazine -rasagiline -supplements like St. John's wort, kava kava, valerian -tramadol -tryptophan This list may not describe all possible interactions. Give your health care provider a list of all the medicines, herbs, non-prescription drugs, or dietary supplements you use. Also tell them if you smoke, drink alcohol, or use illegal drugs. Some items may interact with your medicine. What should I watch for while using this medicine? Tell your doctor if your symptoms do not get better or if they get worse. Visit your doctor or health care professional for regular checks on your progress. Because it may take several weeks to see the full effects of this medicine, it is important to continue your treatment as prescribed by your doctor. Patients and their families should watch out for new or worsening thoughts of suicide or depression. Also watch out for sudden changes in feelings such as feeling anxious, agitated, panicky, irritable, hostile, aggressive, impulsive, severely restless, overly excited and hyperactive, or not being able to sleep. If this happens, especially at the beginning of treatment or after a change in dose, call your health care professional. Dennis Bast may get drowsy or dizzy. Do not drive, use machinery, or do anything that needs mental alertness until you know how this medicine affects you. Do not stand or sit up quickly, especially if you are an older patient. This reduces the risk of dizzy or fainting spells. Alcohol may interfere with the effect of this medicine. Avoid alcoholic drinks. Your mouth may get dry. Chewing sugarless gum or sucking hard candy, and drinking plenty of  water may help. Contact your doctor if the problem does not go away or is severe. What side effects may I notice from receiving this medicine? Side effects that you should report to your doctor or health care professional as soon as possible: -allergic reactions like skin rash, itching or hives, swelling of the face, lips, or tongue -confusion -feeling faint or lightheaded, falls -fast talking and excited feelings or actions that are out of control -hallucination, loss of contact with reality -seizures -suicidal thoughts or other mood changes -unusual bleeding or bruising Side effects that usually do not require medical attention (report to your doctor or health care professional if they continue or are bothersome): -blurred vision -changes in appetite -change in sex drive or performance -headache -increased sweating -nausea This list may not describe all possible side effects. Call your doctor for medical advice about side effects. You may report side effects to FDA at 1-800-FDA-1088. Where should I keep my medicine? Keep out of reach of children. Store at room temperature between 15 and 30 degrees C (59 and 86 degrees F). Throw away any unused medicine after the expiration date. NOTE: This sheet is a summary. It may not cover all possible information. If you have questions about this medicine, talk to your doctor, pharmacist, or health care provider.    2016, Elsevier/Gold Standard. (2012-10-06 12:32:55)

## 2015-05-29 DIAGNOSIS — H524 Presbyopia: Secondary | ICD-10-CM | POA: Diagnosis not present

## 2015-06-05 ENCOUNTER — Ambulatory Visit (INDEPENDENT_AMBULATORY_CARE_PROVIDER_SITE_OTHER): Payer: Medicare Other | Admitting: Neurology

## 2015-06-05 DIAGNOSIS — G4700 Insomnia, unspecified: Secondary | ICD-10-CM

## 2015-06-05 DIAGNOSIS — G478 Other sleep disorders: Secondary | ICD-10-CM

## 2015-06-05 DIAGNOSIS — G4750 Parasomnia, unspecified: Secondary | ICD-10-CM

## 2015-06-05 NOTE — Sleep Study (Signed)
Please see the scanned sleep study interpretation located in the Procedure tab within the Chart Review section. 

## 2015-06-12 ENCOUNTER — Telehealth: Payer: Self-pay

## 2015-06-12 NOTE — Telephone Encounter (Signed)
I called pt to discuss sleep study results. No answer, left a message asking her to call me back. 

## 2015-06-13 NOTE — Telephone Encounter (Signed)
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 There are medications used to to treat REM behavior. Snoring was noted in supine position. I advised her that we can make an appt to discuss treatment with Dr. Brett Fairy. An appt was made for 4/13 at 2:30. Pt verbalized understanding.

## 2015-06-14 DIAGNOSIS — M6530 Trigger finger, unspecified finger: Secondary | ICD-10-CM | POA: Insufficient documentation

## 2015-06-14 DIAGNOSIS — M65341 Trigger finger, right ring finger: Secondary | ICD-10-CM | POA: Diagnosis not present

## 2015-06-14 DIAGNOSIS — M65331 Trigger finger, right middle finger: Secondary | ICD-10-CM | POA: Diagnosis not present

## 2015-06-26 DIAGNOSIS — M65341 Trigger finger, right ring finger: Secondary | ICD-10-CM | POA: Diagnosis not present

## 2015-06-26 DIAGNOSIS — M65331 Trigger finger, right middle finger: Secondary | ICD-10-CM | POA: Diagnosis not present

## 2015-07-06 ENCOUNTER — Encounter: Payer: Self-pay | Admitting: Neurology

## 2015-07-06 ENCOUNTER — Ambulatory Visit (INDEPENDENT_AMBULATORY_CARE_PROVIDER_SITE_OTHER): Payer: Medicare Other | Admitting: Neurology

## 2015-07-06 VITALS — BP 118/78 | HR 88 | Resp 20 | Ht 65.0 in | Wt 132.0 lb

## 2015-07-06 DIAGNOSIS — G4700 Insomnia, unspecified: Secondary | ICD-10-CM

## 2015-07-06 DIAGNOSIS — G4750 Parasomnia, unspecified: Secondary | ICD-10-CM

## 2015-07-06 DIAGNOSIS — G478 Other sleep disorders: Secondary | ICD-10-CM

## 2015-07-06 DIAGNOSIS — F515 Nightmare disorder: Secondary | ICD-10-CM

## 2015-07-06 DIAGNOSIS — G3184 Mild cognitive impairment, so stated: Secondary | ICD-10-CM

## 2015-07-06 HISTORY — DX: Nightmare disorder: F51.5

## 2015-07-06 HISTORY — DX: Mild cognitive impairment of uncertain or unknown etiology: G31.84

## 2015-07-06 HISTORY — DX: Parasomnia, unspecified: G47.50

## 2015-07-06 HISTORY — DX: Insomnia, unspecified: G47.00

## 2015-07-06 MED ORDER — ESCITALOPRAM OXALATE 10 MG PO TABS
ORAL_TABLET | ORAL | Status: DC
Start: 2015-07-06 — End: 2016-06-11

## 2015-07-06 MED ORDER — DONEPEZIL HCL 5 MG PO TABS: 5.0000 mg | ORAL_TABLET | Freq: Every day | ORAL | Status: DC

## 2015-07-06 NOTE — Progress Notes (Signed)
PATIENT: Kara Tapia DOB: 1940/04/13  REASON FOR VISIT: follow up- parasomnia, memory disturbance, cerebral microvascular disease HISTORY FROM: patient  HISTORY OF PRESENT ILLNESS: Kara Tapia is a 75 year old female with a history of parasomnia, memory disturbance in cerebral microvascular disease.At the second to last visit she was given Klonopin to help prevent vivit dreams. The patient states that she's been taking this intermittently. She states that it does not consistently help with her dreams and she primarily takes it to help her fall asleep. She states that on December 29 she took Klonopin in addition to an over-the-counter substance called Avinol. Avinol contains melatonin- she took this is approximately 10 PM and then woke up and took Klonopin at 1:30 AM.  She states that she woke up at 3:00 AM and she was on the floor. She does not recall how she got there. She did hit her head and had a laceration on the back of the head as well as a black eye. She did go to the ED for an evaluation. She states that this has not happened before. She is unsure what happened. Patient states that she has a hard time falling and staying asleep. She states this been a chronic issue ever since she was a child. She denies any changes with her memory. She does report that when she was younger and on a hiking trip she had a "heat stroke" and since then she's noticed that her memory has not been the same. She does live alone. She is able to complete all ADLs independently. She operates a Teacher, music without difficulty.   Interval history form 07-06-2015, Kara Tapia underwent a PSG on 06-05-2015, she slept well, was sleep talking loudly, her AHI was 4.2, supine AHi was 6.4, no significant loss of oxygen and no PLMs.  REM BD diagnosed , the patient will be using Klonopin  and melatonin.  She had since that sleep study one more event of a frightening nightmare and did not fall out of bed. She used alprazolam instead  of Klonopin and feels hung over. The 0.25 mg Klonopin does not cause a "medicine head " but only keeps her asleep for 3 hours.   I would much prefer her to take Klonopin and not alprazolam. She reports some visual misperceptions in daytime, believes the cat is running by when it isn't -but no complex hallucinations. No more vision changes or  migraine headaches.     HISTORY 10/04/2014 [facility]):  Kara Tapia is a 75 y.o. female seen here as a referral from Dr. Ashby Dawes and Dr Kathrin Penner for visual changes.  [name] is a right-handed Caucasian female that presents with 3 isolated episodes of visual scotomata. She states that she developed a severe migraine the first severe headache of this kind in her life first one occurred on March 17 of this year and 2 other spells have followed. The first time she was also nauseated but did not have to vomit. She was very photosensitive with all of these spells. The patient reports that on March 17 she experienced the following. In the afternoon, she had noticed an increased level of photosensitivity above report she usually feels. She was going to [employer] to prepare for tutoring or restored. She notice that she couldn't read or see very well and that she had actually blind spots in her vision words literally disappeared when she scanned the paper. This lasted several hours after the visual changes finally resolved she still had a  headache and she resumed and went to bed. Sit-to-stand:  She did not use any medication. She called her ophthalmologist, Dr. Juanito Doom, and her primary care physician, Dr. Ashby Dawes and was referred her here to neurologic care. Total weeks later she had another spell not as bad in terms of headache intensity  And nausea. no nausea without HA . She again had the scotomata. Then a third spell occurred 2 weeks ago. I was able to review the patient's medication list and her lab results from 10-26-13. Under her past medical  history migraine headaches were noted .She was never diagnosed as migraines before Dr. Hal Neer presumably diagnosed her with a migraine, but the patient states she had no visual aura and she had no headaches were so spells just a crawling creepy crawling dysesthesia at the left side of the skull.  The patient states she tries to keep well hydrated, and she was neither sleep deprived nor did she have any unusual dietary or exercise activity- that she could identify that as a trigger for any of the 3 spells.  Interval history from 10-04-14. I see Kara Tapia today - she recently went to the beach and she looks well rested , also the scorching heat was not necessarily feeling good to her.  She underwent an MRI study of the brain with and without contrast on 08-26-14 after suffering unusual headache spells which has not repeated since our last visit. The MRI showed scattered white matter changes most consistent with microvascular charges changes. A demyelinization would be considered less likely. A previous MRI from 7-16 2009 showed a progression of the small vessel lesions. The patient wanted to address with me something she encountered while at the beach - there she has sleep problems and often nightmarish dreams. She kicked herself literally out of the bed and cut her leg in the process. She feels usually attacked or persecuted or threatened in her dreams and they seem to be REM behavior disorder or pavor nocturnus. She started once screaming on an airplane, returning from [place].  She will feel the need to defend herself physically and act out her dreams in bed. She has not been sleep walking. Shee was a sleep walker as a child she reports. She tried melatonin but felt that she actually had more of these spells while being on melatonin. Kara Tapia has never tried Klonopin or another benzodiazepine. I will prescribe the lowest dose for now. The spells occur 2-4 hours after falling asleep. She is not interested in  SSRI. she has no history supporting PTSD. The dreams are so very real to her, she is scared.     Montreal Cognitive Assessment  05/22/2015 04/04/2015  Visuospatial/ Executive (0/5) 5 5  Naming (0/3) 3 3  Attention: Read list of digits (0/2) 2 1  Attention: Read list of letters (0/1) 1 1  Attention: Serial 7 subtraction starting at 100 (0/3) 1 2  Language: Repeat phrase (0/2) 2 1  Language : Fluency (0/1) 1 1  Abstraction (0/2) 2 1  Delayed Recall (0/5) 3 1  Orientation (0/6) 6 6  Total 26 22  Adjusted Score (based on education) 26 -      REVIEW OF SYSTEMS: Out of a complete 14 system review of symptoms, the patient complains only of the following symptoms, and all other reviewed systems are negative. Blurred vision, restless leg, insomnia, sleep talking, headache, passing out  ALLERGIES: Allergies  Allergen Reactions  . Caine-1 [Lidocaine] Other (See Comments)    Affects  her BP    HOME MEDICATIONS: Outpatient Prescriptions Prior to Visit  Medication Sig Dispense Refill  . ALPRAZolam (XANAX) 0.25 MG tablet Take 1 tablet (0.25 mg total) by mouth at bedtime as needed for anxiety. 30 tablet 0  . aspirin 75 MG chewable tablet Chew 1 tablet (75 mg total) by mouth daily. (Patient taking differently: Chew 75 mg by mouth daily as needed. ) 30 tablet 5  . eletriptan (RELPAX) 20 MG tablet Take 1 tablet (20 mg total) by mouth as needed for migraine or headache. May repeat in 2 hours if headache persists or recurs. 10 tablet 0  . escitalopram (LEXAPRO) 10 MG tablet Take 0.5 tablets (5 mg total) by mouth at bedtime. 45 tablet 3   No facility-administered medications prior to visit.    PAST MEDICAL HISTORY: Past Medical History  Diagnosis Date  . PVD (peripheral vascular disease) ([place])   . Osteoarthritis   . Reflux esophagitis   . Migraine   . Arthritis   . Hematuria   . History of palpitations   . Migraine with status migrainosus 08/09/2014    Ocular migraines.  . Night terrors,  adult     PAST SURGICAL HISTORY: History reviewed. No pertinent past surgical history.  FAMILY HISTORY: Family History  Problem Relation Age of Onset  . Diabetes Mother   . Heart disease Mother   . Cancer Mother   . Cancer Father   . Lung disease Sister     SOCIAL HISTORY: Social History   Social History  . Marital Status: Single    Spouse Name: N/A  . Number of Children: N/A  . Years of Education: N/A   Occupational History  . Not on file.   Social History Main Topics  . Smoking status: Current Some Day Smoker -- 0.50 packs/day    Types: Cigarettes  . Smokeless tobacco: Never Used  . Alcohol Use: 3.0 oz/week    5 Glasses of wine per week  . Drug Use: No  . Sexual Activity: Not on file   Other Topics Concern  . Not on file   Social History Narrative   Drinks 3-4 cups of caffeine daily.      PHYSICAL EXAM  Filed Vitals:   07/06/15 1431  BP: 118/78  Pulse: 88  Resp: 20  Height: 5\' 5"  (1.651 m)  Weight: 132 lb (59.875 kg)   Body mass index is 21.97 kg/(m^2).  Montreal Cognitive Assessment  05/22/2015 04/04/2015  Visuospatial/ Executive (0/5) 5 5  Naming (0/3) 3 3  Attention: Read list of digits (0/2) 2 1  Attention: Read list of letters (0/1) 1 1  Attention: Serial 7 subtraction starting at 100 (0/3) 1 2  Language: Repeat phrase (0/2) 2 1  Language : Fluency (0/1) 1 1  Abstraction (0/2) 2 1  Delayed Recall (0/5) 3 1  Orientation (0/6) 6 6  Total 26 22  Adjusted Score (based on education) 26 -     Generalized: Well developed, in no acute distress   Neurological examination  Mentation: Alert oriented to time, place, history taking.   she has delayed word finding, is less sharp with numbers.   The patient reports intact smell and taste.  Pupils were equal round reactive to light. Extraocular movements were full, visual field were full on confrontational test. Facial sensation and strength were normal. Uvula tongue midline. Head turning and  shoulder shrug  were normal and symmetric. Motor: new onset slight cog wheeling, elevated motor tone, not able to  completely relax, good bilateral grip.  There is resting tremor in all fingers, but mostly right hand middle and ring finger- she just had surgery.   DIAGNOSTIC DATA (LABS, IMAGING, TESTING) - I reviewed patient records, labs, notes, testing and imaging myself where available.  ASSESSMENT AND PLAN 75 y.o. year old female  has a past medical history of PVD (peripheral vascular disease) ([place]); Osteoarthritis; Reflux esophagitis; Migraine; Arthritis; Hematuria; History of palpitations; Migraine with status migrainosus (08/09/2014); and Night terrors, adult. here with: REM behavior disorder,  Sleep talking , cogwheeling, tremor in hands and fingers.     1. Parasomnia- vivid dreams , acting out .  Captured sleep taking in recent sleep study  2. Insomnia , waking up early, Try SSRI and Klonopin..  3. Memory disturbance. See MOCA . Follow in 6 moth with NP.   The patient will discontinue the over-the-counter supplement as well as Klonopin. We will monitor how this effects her sleep.  The patient's memory score is slightly decreased. MOCA today is 26/30 was previously 22/30. We will continue to monitor his memory- this may be a lewy body dementia- day by day variance. .  In the future we may consider a memory medication such as Aricept  She fears side effects and is not in favour of this.  She is still driving and unassisted in all ADLs.  Patient is amenable to this plan.   Shaquera Ansley, MD   07/06/2015, 2:50 PM [facility] [address] [place], [place] [address] [phone]  Cc Dr Ashby Dawes

## 2015-07-06 NOTE — Addendum Note (Signed)
Addended by: Larey Seat on: 07/06/2015 03:17 PM   Modules accepted: Orders

## 2015-07-26 DIAGNOSIS — M79644 Pain in right finger(s): Secondary | ICD-10-CM | POA: Diagnosis not present

## 2015-08-16 DIAGNOSIS — M79644 Pain in right finger(s): Secondary | ICD-10-CM | POA: Diagnosis not present

## 2015-08-30 DIAGNOSIS — M79644 Pain in right finger(s): Secondary | ICD-10-CM | POA: Diagnosis not present

## 2015-09-12 ENCOUNTER — Other Ambulatory Visit: Payer: Self-pay | Admitting: Neurology

## 2015-09-12 MED ORDER — CLONAZEPAM 0.25 MG PO TBDP: 0.2500 mg | ORAL_TABLET | Freq: Every day | ORAL | Status: DC

## 2015-09-12 NOTE — Telephone Encounter (Signed)
Patient called regarding refill on clonazePAM (KLONOPIN) 0.25 MG disintegrating tablet, states this was denied, prescription expired May 30th, patient states she only takes this medication as needed.

## 2015-09-12 NOTE — Telephone Encounter (Signed)
Dr. Brett Fairy approved klonopin RX and it was faxed to [facility]. Received a receipt of confirmation.

## 2015-09-12 NOTE — Telephone Encounter (Signed)
Per Dr. Edwena Felty last note on 07/06/2015, "The patient will discontinue the over-the-counter supplement as well as Klonopin. We will monitor how this effects her sleep."  I will send this request to Dr. Brett Fairy since pt still wants a refill on her klonopin.

## 2015-09-14 DIAGNOSIS — M79644 Pain in right finger(s): Secondary | ICD-10-CM | POA: Diagnosis not present

## 2015-09-25 DIAGNOSIS — M79644 Pain in right finger(s): Secondary | ICD-10-CM | POA: Diagnosis not present

## 2015-09-28 ENCOUNTER — Ambulatory Visit (INDEPENDENT_AMBULATORY_CARE_PROVIDER_SITE_OTHER): Payer: Medicare Other | Admitting: Adult Health

## 2015-09-28 ENCOUNTER — Encounter: Payer: Self-pay | Admitting: Adult Health

## 2015-09-28 VITALS — BP 148/88 | HR 76 | Resp 20 | Ht 65.0 in | Wt 129.0 lb

## 2015-09-28 DIAGNOSIS — G4750 Parasomnia, unspecified: Secondary | ICD-10-CM | POA: Diagnosis not present

## 2015-09-28 DIAGNOSIS — R413 Other amnesia: Secondary | ICD-10-CM

## 2015-09-28 DIAGNOSIS — G43809 Other migraine, not intractable, without status migrainosus: Secondary | ICD-10-CM | POA: Diagnosis not present

## 2015-09-28 NOTE — Patient Instructions (Signed)
Begin Aricept Continue Klonopin as needed for sleep If your symptoms worsen or you develop new symptoms please let us know.

## 2015-09-28 NOTE — Progress Notes (Addendum)
PATIENT: Kara Tapia DOB: 12-21-1940  REASON FOR VISIT: follow up- memory, parasomnia, headache HISTORY FROM: patient  HISTORY OF PRESENT ILLNESS: Today 09/28/15: Kara Tapia is a 75 year old female with a history of parasomnia, headaches and mild memory disturbance. She returns today for follow-up. She reports that her headaches have been under relatively good control. She did have an episode last week. She states that she was working out in the yard. When it started to get very warm she came inside. She did develop an ocular migraine. She states that she laid down briefly and when she got back up she found that it was hard to write sentences. She could speak clearly but could not formulate how to write a sentence. She states that it took her 6 different times before she was able to complete this. She reports after about an hour she was back to normal. Denies any weakness in the upper or lower extremities. No facial droop is present. She did have a mild headache with the optic migraine. She has not had any additional episodes since then. She continues to use Klonopin if needed for sleep. She denies any vivid dreams. She reports that her memory has remained stable. She is able to complete all ADLs independently. She operates a Teacher, music without difficulty. She reports that she has not started Aricept yet. Overall she feels that she is doing well. She returns today for an evaluation.  Interval history form 07-06-2015, Kara Tapia underwent a PSG on 06-05-2015, she slept well, was sleep talking loudly, her AHI was 4.2, supine AHi was 6.4, no significant loss of oxygen and no PLMs.  REM BD diagnosed , the patient will be using Klonopin and melatonin. She had since that sleep study one more event of a frightening nightmare and did not fall out of bed. She used alprazolam instead of Klonopin and feels hung over. The 0.25 mg Klonopin does not cause a "medicine head " but only keeps her asleep for 3 hours.   I would much prefer her to take Klonopin and not alprazolam. She reports some visual misperceptions in daytime, believes the cat is running by when it isn't -but no complex hallucinations. No more vision changes or migraine headaches.   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 The patient states that she's been taking this intermittently. She states that it does not consistently help with her dreams and she primarily takes it to help her fall asleep. She states that on December 29 she took Klonopin in addition to an over-the-counter substance called Avinol. Avinol contains melatonin- she took this is approximately 10 PM and then woke up and took Klonopin at 1:30 AM.  She states that she woke up at 3:00 AM and she was on the floor. She does not recall how she got there. She did hit her head and had a laceration on the back of the head as well as a black eye. She did go to the ED for an evaluation. She states that this has not happened before. She is unsure what happened. Patient states that she has a hard time falling and staying asleep. She states this been a chronic issue ever since she was a child. She denies any changes with her memory. She does report that when she was younger and on a hiking trip she  had a "heat stroke" and since then she's noticed that her memory has not been the same. She does live alone. She is able to complete all ADLs independently. She operates a Teacher, music without difficulty.       HISTORY 10/04/2014 Kara Tapia, Kara Tapia): DELESIA ROBERT is a 75 y.o. female seen here as a referral from Dr. Ashby Dawes and Dr Kathrin Penner for visual changes.  Kara Tapia is a right-handed Caucasian female that presents with 3 isolated episodes of visual scotomata. She states that she developed a severe migraine the first  severe headache of this kind in her life first one occurred on March 17 of this year and 2 other spells have followed. The first time she was also nauseated but did not have to vomit. She was very photosensitive with all of these spells. The patient reports that on March 17 she experienced the following. In the afternoon, she had noticed an increased level of photosensitivity above report she usually feels. She was going to [employer] to prepare for tutoring or restored. She notice that she couldn't read or see very well and that she had actually blind spots in her vision words literally disappeared when she scanned the paper. This lasted several hours after the visual changes finally resolved she still had a headache and she resumed and went to bed. Sit-to-stand: She did not use any medication. She called her ophthalmologist, Dr. Juanito Doom, and her primary care physician, Dr. Ashby Dawes and was referred her here to neurologic care. Total weeks later she had another spell not as bad in terms of headache intensity And nausea. no nausea without HA . She again had the scotomata. Then a third spell occurred 2 weeks ago. I was able to review the patient's medication list and her lab results from 10-26-13. Under her past medical history migraine headaches were noted .She was never diagnosed as migraines before Dr. Hal Neer presumably diagnosed her with a migraine, but the patient states she had no visual aura and she had no headaches were so spells just a crawling creepy crawling dysesthesia at the left side of the skull.  The patient states she tries to keep well hydrated, and she was neither sleep deprived nor did she have any unusual dietary or exercise activity- that she could identify that as a trigger for any of the 3 spells.  Interval history from 10-04-14. I see Kara Tapia today - she recently went to the beach and she looks well rested , also the scorching heat was not necessarily feeling good to  her.  She underwent an MRI study of the brain with and without contrast on 08-26-14 after suffering unusual headache spells which has not repeated since our last visit. The MRI showed scattered white matter changes most consistent with microvascular charges changes. A demyelinization would be considered less likely. A previous MRI from 7-16 2009 showed a progression of the small vessel lesions. The patient wanted to address with me something she encountered while at the beach - there she has sleep problems and often nightmarish dreams. She kicked herself literally out of the bed and cut her leg in the process. She feels usually attacked or persecuted or threatened in her dreams and they seem to be REM behavior disorder or pavor nocturnus. She started once screaming on an airplane, returning from [place].  She will feel the need to defend herself physically and act out her dreams in bed. She has not been sleep walking. Shee was a sleep walker as a  child she reports. She tried melatonin but felt that she actually had more of these spells while being on melatonin. He has never tried Klonopin or another benzodiazepine. I will prescribe the lowest dose for now. The spells occur 2-4 hours after falling asleep. She is not interested in SSRI. she has no history supporting PTSD. The dreams are so very real to her, she is scared.    REVIEW OF SYSTEMS: Out of a complete 14 system review of symptoms, the patient complains only of the following symptoms, and all other reviewed systems are negative.  Memory loss, headache, insomnia  ALLERGIES: Allergies  Allergen Reactions  . Caine-1 [Lidocaine] Other (See Comments)    Affects her BP    HOME MEDICATIONS: Outpatient Prescriptions Prior to Visit  Medication Sig Dispense Refill  . ALPRAZolam (XANAX) 0.25 MG tablet Take 1 tablet (0.25 mg total) by mouth at bedtime as needed for anxiety. 30 tablet 0  . aspirin 75 MG chewable tablet Chew 1 tablet (75 mg total)  by mouth daily. (Patient taking differently: Chew 75 mg by mouth daily as needed. ) 30 tablet 5  . clonazePAM (KLONOPIN) 0.25 MG disintegrating tablet Take 1 tablet (0.25 mg total) by mouth at bedtime. 60 tablet 1  . eletriptan (RELPAX) 20 MG tablet Take 1 tablet (20 mg total) by mouth as needed for migraine or headache. May repeat in 2 hours if headache persists or recurs. 10 tablet 0  . escitalopram (LEXAPRO) 10 MG tablet Now take a full tab 10 mg , once a day by mouth, 07-06-2015 90 tablet 3  . donepezil (ARICEPT) 5 MG tablet Take 1 tablet (5 mg total) by mouth at bedtime. (Patient not taking: Reported on 09/28/2015) 30 tablet 0   No facility-administered medications prior to visit.    PAST MEDICAL HISTORY: Past Medical History  Diagnosis Date  . PVD (peripheral vascular disease) ([place])   . Osteoarthritis   . Reflux esophagitis   . Migraine   . Arthritis   . Hematuria   . History of palpitations   . Migraine with status migrainosus 08/09/2014    Ocular migraines.  . Night terrors, adult     PAST SURGICAL HISTORY: No past surgical history on file.  FAMILY HISTORY: Family History  Problem Relation Age of Onset  . Diabetes Mother   . Heart disease Mother   . Cancer Mother   . Cancer Father   . Lung disease Sister     SOCIAL HISTORY: Social History   Social History  . Marital Status: Single    Spouse Name: N/A  . Number of Children: N/A  . Years of Education: N/A   Occupational History  . Not on file.   Social History Main Topics  . Smoking status: Current Some Day Smoker -- 0.50 packs/day    Types: Cigarettes  . Smokeless tobacco: Never Used  . Alcohol Use: 3.0 oz/week    5 Glasses of wine per week  . Drug Use: No  . Sexual Activity: Not on file   Other Topics Concern  . Not on file   Social History Narrative   Drinks 3-4 cups of caffeine daily.      PHYSICAL EXAM  Filed Vitals:   09/28/15 0934  BP: 148/88  Pulse: 76  Resp: 20  Height: 5\' 5"  (1.651  m)  Weight: 129 lb (58.514 kg)   Body mass index is 21.47 kg/(m^2).  Montreal Cognitive Assessment  09/28/2015 05/22/2015 04/04/2015  Visuospatial/ Executive (0/5) 4 5 5  Naming (0/3) 3 3 3   Attention: Read list of digits (0/2) 2 2 1   Attention: Read list of letters (0/1) 1 1 1   Attention: Serial 7 subtraction starting at 100 (0/3) 3 1 2   Language: Repeat phrase (0/2) 2 2 1   Language : Fluency (0/1) 1 1 1   Abstraction (0/2) 2 2 1   Delayed Recall (0/5) 1 3 1   Orientation (0/6) 6 6 6   Total 25 26 22   Adjusted Score (based on education) 25 26 -     Generalized: Well developed, in no acute distress   Neurological examination  Mentation: Alert oriented to time, place, history taking. Follows all commands speech and language fluent Cranial nerve II-XII: Pupils were equal round reactive to light. Extraocular movements were full, visual field were full on confrontational test. Facial sensation and strength were normal. Uvula tongue midline. Head turning and shoulder shrug  were normal and symmetric. Motor: The motor testing reveals 5 over 5 strength of all 4 extremities. Good symmetric motor tone is noted throughout.  Sensory: Sensory testing is intact to soft touch on all 4 extremities. No evidence of extinction is noted.  Coordination: Cerebellar testing reveals good finger-nose-finger and heel-to-shin bilaterally.  Gait and station: Gait is normal. Tandem gait is normal. Romberg is negative. No drift is seen.  Reflexes: Deep tendon reflexes are symmetric and normal bilaterally.   DIAGNOSTIC DATA (LABS, IMAGING, TESTING) - I reviewed patient records, labs, notes, testing and imaging myself where available.  Lab Results  Component Value Date   WBC 6.6 03/24/2015   HGB 15.3* 03/24/2015   HCT 45.1 03/24/2015   MCV 96.6 03/24/2015   PLT 255 03/24/2015      Component Value Date/Time   NA 140 03/24/2015 0502   K 4.2 03/24/2015 0502   CL 104 03/24/2015 0502   CO2 27 03/24/2015 0502    GLUCOSE 97 03/24/2015 0502   BUN 12 03/24/2015 0502   CREATININE 0.86 03/24/2015 0502   CALCIUM 9.6 03/24/2015 0502   PROT 7.2 05/13/2011 1424   ALBUMIN 4.2 05/13/2011 1424   AST 24 05/13/2011 1424   ALT 16 05/13/2011 1424   ALKPHOS 80 05/13/2011 1424   BILITOT 0.5 05/13/2011 1424   GFRNONAA >60 03/24/2015 0502   GFRAA >60 03/24/2015 0502      ASSESSMENT AND PLAN 75 y.o. year old female  has a past medical history of PVD (peripheral vascular disease) ([place]); Osteoarthritis; Reflux esophagitis; Migraine; Arthritis; Hematuria; History of palpitations; Migraine with status migrainosus (08/09/2014); and Night terrors, adult. here with :  1. Memory disturbance 2. Migraine  3. Parasomnia  Overall the patient is doing well. Her memory score has remained stable. She has not started Aricept. The plans to do so this week. The patient's headaches have been under relatively good control. Patient advised that if she has anymore events where she finds it hard to write sentences or speak she should let us know. I did advise that if she has severe symptoms such as slurred speech, facial droop or weakness in the upper or lower extremities she should go to the emergency room. Patient will continue using Klonopin as needed for sleep. Advised that if her symptoms worsen or she develops any new symptoms she she'll let us know. Will follow-up in 6 months or sooner if needed.   Ward Givens, MSN, NP-C 09/28/2015, 9:39 AM [facility], [facility] [address], [place], [place] [address] [phone]  I reviewed the above note and documentation by the  Nurse Practitioner and agree with the history, physical exam, assessment and plan as outlined above. I was immediately available for face-to-face consultation. Star Age, MD, PhD [facility])

## 2015-10-26 ENCOUNTER — Other Ambulatory Visit: Payer: Self-pay | Admitting: Neurology

## 2015-10-26 DIAGNOSIS — G3184 Mild cognitive impairment, so stated: Secondary | ICD-10-CM

## 2015-10-26 DIAGNOSIS — G4700 Insomnia, unspecified: Secondary | ICD-10-CM

## 2015-10-26 DIAGNOSIS — F515 Nightmare disorder: Secondary | ICD-10-CM

## 2015-10-26 DIAGNOSIS — G4750 Parasomnia, unspecified: Secondary | ICD-10-CM

## 2015-11-20 ENCOUNTER — Other Ambulatory Visit: Payer: Self-pay | Admitting: Neurology

## 2015-11-20 DIAGNOSIS — G4750 Parasomnia, unspecified: Secondary | ICD-10-CM

## 2015-11-20 DIAGNOSIS — G3184 Mild cognitive impairment, so stated: Secondary | ICD-10-CM

## 2015-11-20 DIAGNOSIS — G4700 Insomnia, unspecified: Secondary | ICD-10-CM

## 2015-11-20 DIAGNOSIS — F515 Nightmare disorder: Secondary | ICD-10-CM

## 2015-12-19 DIAGNOSIS — R319 Hematuria, unspecified: Secondary | ICD-10-CM | POA: Diagnosis not present

## 2015-12-19 DIAGNOSIS — M79602 Pain in left arm: Secondary | ICD-10-CM | POA: Diagnosis not present

## 2015-12-19 DIAGNOSIS — Z0000 Encounter for general adult medical examination without abnormal findings: Secondary | ICD-10-CM | POA: Diagnosis not present

## 2015-12-19 DIAGNOSIS — F411 Generalized anxiety disorder: Secondary | ICD-10-CM | POA: Diagnosis not present

## 2015-12-19 DIAGNOSIS — R072 Precordial pain: Secondary | ICD-10-CM | POA: Diagnosis not present

## 2015-12-21 ENCOUNTER — Other Ambulatory Visit: Payer: Self-pay | Admitting: Neurology

## 2015-12-21 DIAGNOSIS — F515 Nightmare disorder: Secondary | ICD-10-CM

## 2015-12-21 DIAGNOSIS — G4750 Parasomnia, unspecified: Secondary | ICD-10-CM

## 2015-12-21 DIAGNOSIS — G4700 Insomnia, unspecified: Secondary | ICD-10-CM

## 2015-12-21 DIAGNOSIS — G3184 Mild cognitive impairment, so stated: Secondary | ICD-10-CM

## 2015-12-25 DIAGNOSIS — Z800 Family history of malignant neoplasm of digestive organs: Secondary | ICD-10-CM | POA: Diagnosis not present

## 2015-12-25 DIAGNOSIS — R197 Diarrhea, unspecified: Secondary | ICD-10-CM | POA: Diagnosis not present

## 2015-12-25 DIAGNOSIS — Z86010 Personal history of colonic polyps: Secondary | ICD-10-CM | POA: Diagnosis not present

## 2016-01-01 DIAGNOSIS — K219 Gastro-esophageal reflux disease without esophagitis: Secondary | ICD-10-CM | POA: Diagnosis not present

## 2016-01-01 DIAGNOSIS — Z23 Encounter for immunization: Secondary | ICD-10-CM | POA: Diagnosis not present

## 2016-01-01 DIAGNOSIS — M150 Primary generalized (osteo)arthritis: Secondary | ICD-10-CM | POA: Diagnosis not present

## 2016-01-01 DIAGNOSIS — I739 Peripheral vascular disease, unspecified: Secondary | ICD-10-CM | POA: Diagnosis not present

## 2016-01-01 DIAGNOSIS — R413 Other amnesia: Secondary | ICD-10-CM | POA: Diagnosis not present

## 2016-01-02 DIAGNOSIS — Z1231 Encounter for screening mammogram for malignant neoplasm of breast: Secondary | ICD-10-CM | POA: Diagnosis not present

## 2016-01-03 ENCOUNTER — Ambulatory Visit: Payer: Medicare Other | Admitting: Adult Health

## 2016-01-08 DIAGNOSIS — L814 Other melanin hyperpigmentation: Secondary | ICD-10-CM | POA: Diagnosis not present

## 2016-01-08 DIAGNOSIS — L820 Inflamed seborrheic keratosis: Secondary | ICD-10-CM | POA: Diagnosis not present

## 2016-01-08 DIAGNOSIS — L570 Actinic keratosis: Secondary | ICD-10-CM | POA: Diagnosis not present

## 2016-01-08 DIAGNOSIS — Z85828 Personal history of other malignant neoplasm of skin: Secondary | ICD-10-CM | POA: Diagnosis not present

## 2016-01-08 DIAGNOSIS — D1800 Hemangioma unspecified site: Secondary | ICD-10-CM | POA: Diagnosis not present

## 2016-01-08 DIAGNOSIS — L821 Other seborrheic keratosis: Secondary | ICD-10-CM | POA: Diagnosis not present

## 2016-01-08 DIAGNOSIS — D485 Neoplasm of uncertain behavior of skin: Secondary | ICD-10-CM | POA: Diagnosis not present

## 2016-01-08 DIAGNOSIS — D225 Melanocytic nevi of trunk: Secondary | ICD-10-CM | POA: Diagnosis not present

## 2016-01-15 ENCOUNTER — Other Ambulatory Visit: Payer: Self-pay | Admitting: Neurology

## 2016-01-15 DIAGNOSIS — G3184 Mild cognitive impairment, so stated: Secondary | ICD-10-CM

## 2016-01-15 DIAGNOSIS — F515 Nightmare disorder: Secondary | ICD-10-CM

## 2016-01-15 DIAGNOSIS — G4700 Insomnia, unspecified: Secondary | ICD-10-CM

## 2016-01-15 DIAGNOSIS — G4750 Parasomnia, unspecified: Secondary | ICD-10-CM

## 2016-01-17 DIAGNOSIS — D485 Neoplasm of uncertain behavior of skin: Secondary | ICD-10-CM | POA: Diagnosis not present

## 2016-02-04 DIAGNOSIS — J019 Acute sinusitis, unspecified: Secondary | ICD-10-CM | POA: Diagnosis not present

## 2016-02-04 DIAGNOSIS — R05 Cough: Secondary | ICD-10-CM | POA: Diagnosis not present

## 2016-02-06 DIAGNOSIS — R3121 Asymptomatic microscopic hematuria: Secondary | ICD-10-CM | POA: Diagnosis not present

## 2016-02-12 DIAGNOSIS — R05 Cough: Secondary | ICD-10-CM | POA: Diagnosis not present

## 2016-02-12 DIAGNOSIS — R3121 Asymptomatic microscopic hematuria: Secondary | ICD-10-CM | POA: Diagnosis not present

## 2016-02-12 DIAGNOSIS — J441 Chronic obstructive pulmonary disease with (acute) exacerbation: Secondary | ICD-10-CM | POA: Diagnosis not present

## 2016-02-12 DIAGNOSIS — J22 Unspecified acute lower respiratory infection: Secondary | ICD-10-CM | POA: Diagnosis not present

## 2016-02-12 DIAGNOSIS — R319 Hematuria, unspecified: Secondary | ICD-10-CM | POA: Diagnosis not present

## 2016-02-19 ENCOUNTER — Other Ambulatory Visit: Payer: Self-pay | Admitting: Neurology

## 2016-02-19 DIAGNOSIS — G4750 Parasomnia, unspecified: Secondary | ICD-10-CM

## 2016-02-19 DIAGNOSIS — G4700 Insomnia, unspecified: Secondary | ICD-10-CM

## 2016-02-19 DIAGNOSIS — F515 Nightmare disorder: Secondary | ICD-10-CM

## 2016-02-19 DIAGNOSIS — G3184 Mild cognitive impairment, so stated: Secondary | ICD-10-CM

## 2016-02-22 DIAGNOSIS — R3121 Asymptomatic microscopic hematuria: Secondary | ICD-10-CM | POA: Diagnosis not present

## 2016-02-27 DIAGNOSIS — D123 Benign neoplasm of transverse colon: Secondary | ICD-10-CM | POA: Diagnosis not present

## 2016-02-27 DIAGNOSIS — Z1211 Encounter for screening for malignant neoplasm of colon: Secondary | ICD-10-CM | POA: Diagnosis not present

## 2016-02-27 DIAGNOSIS — K5730 Diverticulosis of large intestine without perforation or abscess without bleeding: Secondary | ICD-10-CM | POA: Diagnosis not present

## 2016-02-27 DIAGNOSIS — K635 Polyp of colon: Secondary | ICD-10-CM | POA: Diagnosis not present

## 2016-02-27 DIAGNOSIS — Z86010 Personal history of colonic polyps: Secondary | ICD-10-CM | POA: Diagnosis not present

## 2016-03-05 DIAGNOSIS — J441 Chronic obstructive pulmonary disease with (acute) exacerbation: Secondary | ICD-10-CM | POA: Diagnosis not present

## 2016-03-13 ENCOUNTER — Other Ambulatory Visit: Payer: Self-pay | Admitting: Neurology

## 2016-03-13 DIAGNOSIS — G3184 Mild cognitive impairment, so stated: Secondary | ICD-10-CM

## 2016-03-13 DIAGNOSIS — G4750 Parasomnia, unspecified: Secondary | ICD-10-CM

## 2016-03-13 DIAGNOSIS — F515 Nightmare disorder: Secondary | ICD-10-CM

## 2016-03-13 DIAGNOSIS — G4700 Insomnia, unspecified: Secondary | ICD-10-CM

## 2016-04-01 DIAGNOSIS — Z8742 Personal history of other diseases of the female genital tract: Secondary | ICD-10-CM | POA: Diagnosis not present

## 2016-04-01 DIAGNOSIS — N951 Menopausal and female climacteric states: Secondary | ICD-10-CM | POA: Diagnosis not present

## 2016-04-01 DIAGNOSIS — Z6821 Body mass index (BMI) 21.0-21.9, adult: Secondary | ICD-10-CM | POA: Diagnosis not present

## 2016-04-01 DIAGNOSIS — R319 Hematuria, unspecified: Secondary | ICD-10-CM | POA: Diagnosis not present

## 2016-04-01 DIAGNOSIS — Z130 Encounter for screening for diseases of the blood and blood-forming organs and certain disorders involving the immune mechanism: Secondary | ICD-10-CM | POA: Diagnosis not present

## 2016-04-04 ENCOUNTER — Encounter: Payer: Self-pay | Admitting: Adult Health

## 2016-04-04 ENCOUNTER — Ambulatory Visit (INDEPENDENT_AMBULATORY_CARE_PROVIDER_SITE_OTHER): Payer: Medicare Other | Admitting: Adult Health

## 2016-04-04 VITALS — BP 119/76 | HR 82 | Wt 134.6 lb

## 2016-04-04 DIAGNOSIS — G4750 Parasomnia, unspecified: Secondary | ICD-10-CM | POA: Diagnosis not present

## 2016-04-04 DIAGNOSIS — R413 Other amnesia: Secondary | ICD-10-CM | POA: Diagnosis not present

## 2016-04-04 DIAGNOSIS — G479 Sleep disorder, unspecified: Secondary | ICD-10-CM

## 2016-04-04 MED ORDER — CLONAZEPAM 0.5 MG PO TBDP: 0.5000 mg | ORAL_TABLET | Freq: Every evening | ORAL | 5 refills | Status: DC | PRN

## 2016-04-04 MED ORDER — DONEPEZIL HCL 5 MG PO TABS: 5.0000 mg | ORAL_TABLET | Freq: Every day | ORAL | 11 refills | Status: DC

## 2016-04-04 NOTE — Progress Notes (Signed)
PATIENT: Kara Tapia DOB: 1940-10-30  REASON FOR VISIT: follow up- headache, mild memory disturbance, parasomnia HISTORY FROM: patient  HISTORY OF PRESENT ILLNESS: Kara Tapia is a 76 year old female with a history of parasomnia, headache, mild memory disturbance. She returns today for follow-up. The patient feels that her memory has remained stable. She is now taking Aricept 5 mg at bedtime. She reports that she is able to complete all ADLs independently. She operates a Teacher, music without difficulty. Able to prepare her own meals. Denies any trouble sleeping. Denies vivid dreams. She continues to take Klonopin at bedtime although she felt that it does not work as well as it initially. She reports that she does not take Klonopin every night. She reports that the night she does take it. Typically will only last for 3 hours then she will wake back up and say week for 2 hours then go back to sleep. Patient also states that her headaches have remained stable. She returns today for an evaluation.  Update 09/28/15: Kara Tapia is a 76 year old female with a history of parasomnia, headaches and mild memory disturbance. She returns today for follow-up. She reports that her headaches have been under relatively good control. She did have an episode last week. She states that she was working out in the yard. When it started to get very warm she came inside. She did develop an ocular migraine. She states that she laid down briefly and when she got back up she found that it was hard to write sentences. She could speak clearly but could not formulate how to write a sentence. She states that it took her 6 different times before she was able to complete this. She reports after about an hour she was back to normal. Denies any weakness in the upper or lower extremities. No facial droop is present. She did have a mild headache with the optic migraine. She has not had any additional episodes since then. She continues to  use Klonopin if needed for sleep. She denies any vivid dreams. She reports that her memory has remained stable. She is able to complete all ADLs independently. She operates a Teacher, music without difficulty. She reports that she has not started Aricept yet. Overall she feels that she is doing well. She returns today for an evaluation.  Interval history form 07-06-2015, [name] underwent a PSG on 06-05-2015, she slept well, was sleep talking loudly, her AHI was 4.2, supine AHi was 6.4, no significant loss of oxygen and no PLMs.  REM BD diagnosed , the patient will be using Klonopin and melatonin. She had since that sleep study one more event of a frightening nightmare and did not fall out of bed. She used alprazolam instead of Klonopin and feels hung over. The 0.25 mg Klonopin does not cause a "medicine head " but only keeps her asleep for 3 hours.  I would much prefer her to take Klonopin and not alprazolam. She reports some visual misperceptions in daytime, believes the cat is running by when it isn't -but no complex hallucinations. No more vision changes or migraine headaches.   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 The patient states that she's been taking this intermittently. She states that it does not consistently help with her dreams and she primarily takes it to help her fall asleep.  She states that on December 29 she took Klonopin in addition to an over-the-counter substance called Avinol. Avinol contains melatonin- she took this is approximately 10 PM and then woke up and took Klonopin at 1:30 AM.  She states that she woke up at 3:00 AM and she was on the floor. She does not recall how she got there. She did hit her head and had a laceration on the back of the head as well as a black eye. She did go to the ED for  an evaluation. She states that this has not happened before. She is unsure what happened. Patient states that she has a hard time falling and staying asleep. She states this been a chronic issue ever since she was a child. She denies any changes with her memory. She does report that when she was younger and on a hiking trip she had a "heat stroke" and since then she's noticed that her memory has not been the same. She does live alone. She is able to complete all ADLs independently. She operates a Teacher, music without difficulty.    REVIEW OF SYSTEMS: Out of a complete 14 system review of symptoms, the patient complains only of the following symptoms, and all other reviewed systems are negative.  Insomnia, frequent waking, snoring, sleep talking, runny nose  ALLERGIES: Allergies  Allergen Reactions  . Caine-1 [Lidocaine] Other (See Comments)    Affects her BP    HOME MEDICATIONS: Outpatient Medications Prior to Visit  Medication Sig Dispense Refill  . clonazePAM (KLONOPIN) 0.25 MG disintegrating tablet DISSOLVE 1 TABLET IN MOUTH AT BEDTIME. 60 tablet 0  . donepezil (ARICEPT) 5 MG tablet TAKE ONE TABLET AT BEDTIME. 30 tablet 0  . ALPRAZolam (XANAX) 0.25 MG tablet Take 1 tablet (0.25 mg total) by mouth at bedtime as needed for anxiety. (Patient not taking: Reported on 04/04/2016) 30 tablet 0  . aspirin 75 MG chewable tablet Chew 1 tablet (75 mg total) by mouth daily. (Patient not taking: Reported on 04/04/2016) 30 tablet 5  . escitalopram (LEXAPRO) 10 MG tablet Now take a full tab 10 mg , once a day by mouth, 07-06-2015 (Patient not taking: Reported on 04/04/2016) 90 tablet 3   No facility-administered medications prior to visit.     PAST MEDICAL HISTORY: Past Medical History:  Diagnosis Date  . Arthritis   . Hematuria   . History of palpitations   . Migraine   . Migraine with status migrainosus 08/09/2014   Ocular migraines.  . Night terrors, adult   . Osteoarthritis   . PVD  (peripheral vascular disease) ([place])   . Reflux esophagitis     PAST SURGICAL HISTORY: No past surgical history on file.  FAMILY HISTORY: Family History  Problem Relation Age of Onset  . Diabetes Mother   . Heart disease Mother   . Cancer Mother   . Cancer Father   . Lung disease Sister     SOCIAL HISTORY: Social History   Social History  . Marital status: Single    Spouse name: N/A  . Number of children: 3  . Years of education: 37   Occupational History  . Not on file.   Social History Main Topics  . Smoking status: Current Some Day Smoker    Packs/day: 0.50    Types: Cigarettes  . Smokeless tobacco: Never Used  . Alcohol use 3.0 oz/week    5 Glasses of wine per week  . Drug use: No  . Sexual activity: Not  on file   Other Topics Concern  . Not on file   Social History Narrative   Drinks 3-4 cups of caffeine daily.      PHYSICAL EXAM  Vitals:   04/04/16 1253  BP: 119/76  Pulse: 82  Weight: 134 lb 9.6 oz (61.1 kg)   Body mass index is 22.4 kg/m.   Montreal Cognitive Assessment  09/28/2015 05/22/2015 04/04/2015  Visuospatial/ Executive (0/5) 4 5 5   Naming (0/3) 3 3 3   Attention: Read list of digits (0/2) 2 2 1   Attention: Read list of letters (0/1) 1 1 1   Attention: Serial 7 subtraction starting at 100 (0/3) 3 1 2   Language: Repeat phrase (0/2) 2 2 1   Language : Fluency (0/1) 1 1 1   Abstraction (0/2) 2 2 1   Delayed Recall (0/5) 1 3 1   Orientation (0/6) 6 6 6   Total 25 26 22   Adjusted Score (based on education) 25 26 -    Generalized: Well developed, in no acute distress   Neurological examination  Mentation: Alert oriented to time, place, history taking. Follows all commands speech and language fluent. MOCA today 26/30 Cranial nerve II-XII: Pupils were equal round reactive to light. Extraocular movements were full, visual field were full on confrontational test. Facial sensation and strength were normal. Uvula tongue midline. Head turning and  shoulder shrug  were normal and symmetric. Motor: The motor testing reveals 5 over 5 strength of all 4 extremities. Good symmetric motor tone is noted throughout.  Sensory: Sensory testing is intact to soft touch on all 4 extremities. No evidence of extinction is noted.  Coordination: Cerebellar testing reveals good finger-nose-finger and heel-to-shin bilaterally.  Gait and station: Gait is normal. Tandem gait is normal. Romberg is negative. No drift is seen.  Reflexes: Deep tendon reflexes are symmetric and normal bilaterally.   DIAGNOSTIC DATA (LABS, IMAGING, TESTING) - I reviewed patient records, labs, notes, testing and imaging myself where available.  Lab Results  Component Value Date   WBC 6.6 03/24/2015   HGB 15.3 (H) 03/24/2015   HCT 45.1 03/24/2015   MCV 96.6 03/24/2015   PLT 255 03/24/2015      Component Value Date/Time   NA 140 03/24/2015 0502   K 4.2 03/24/2015 0502   CL 104 03/24/2015 0502   CO2 27 03/24/2015 0502   GLUCOSE 97 03/24/2015 0502   BUN 12 03/24/2015 0502   CREATININE 0.86 03/24/2015 0502   CALCIUM 9.6 03/24/2015 0502   PROT 7.2 05/13/2011 1424   ALBUMIN 4.2 05/13/2011 1424   AST 24 05/13/2011 1424   ALT 16 05/13/2011 1424   ALKPHOS 80 05/13/2011 1424   BILITOT 0.5 05/13/2011 1424   GFRNONAA >60 03/24/2015 0502   GFRAA >60 03/24/2015 0502   No results found for: CHOL, HDL, LDLCALC, LDLDIRECT, TRIG, CHOLHDL No results found for: HGBA1C No results found for: VITAMINB12 No results found for: TSH    ASSESSMENT AND PLAN 76 y.o. year old female  has a past medical history of Arthritis; Hematuria; History of palpitations; Migraine; Migraine with status migrainosus (08/09/2014); Night terrors, adult; Osteoarthritis; PVD (peripheral vascular disease) ([place]); and Reflux esophagitis. here with:  1. Mild memory disturbance 2. Sleep disturbance 3. Headache  Overall the patient is doing well. Her memory score has remained stable. She will remain on Aricept  5 mg at bedtime. The patient does not feel that [place] works as well as when she first started the medication. We will increase Klonopin to 0.5 mg at bedtime  as needed for sleep. Advised that if her symptoms worsen or she develops new symptoms she should let us know. Follow-up in 6 months or sooner if needed.     Ward Givens, MSN, NP-C 04/04/2016, 1:05 PM [facility] [address], [place], [place] [address] [phone]

## 2016-04-04 NOTE — Patient Instructions (Signed)
Continue Aricept 5 mg at bedtime Increase Klonopin 0.5 mg at bedtime if needed If your symptoms worsen or you develop new symptoms please let us know.

## 2016-04-30 ENCOUNTER — Other Ambulatory Visit: Payer: Self-pay

## 2016-04-30 DIAGNOSIS — L570 Actinic keratosis: Secondary | ICD-10-CM | POA: Diagnosis not present

## 2016-04-30 DIAGNOSIS — D485 Neoplasm of uncertain behavior of skin: Secondary | ICD-10-CM | POA: Diagnosis not present

## 2016-06-03 DIAGNOSIS — G43109 Migraine with aura, not intractable, without status migrainosus: Secondary | ICD-10-CM | POA: Diagnosis not present

## 2016-06-03 DIAGNOSIS — G3184 Mild cognitive impairment, so stated: Secondary | ICD-10-CM | POA: Diagnosis not present

## 2016-06-04 DIAGNOSIS — L570 Actinic keratosis: Secondary | ICD-10-CM | POA: Diagnosis not present

## 2016-06-05 ENCOUNTER — Telehealth: Payer: Self-pay | Admitting: Adult Health

## 2016-06-05 NOTE — Telephone Encounter (Signed)
Spoke to pt.  She relayed that last Sunday after church when at [place], got up to go to RR, noted slight headache and ocular migraine (see's blotches in vision).  This last for about 30 minutes.  She went back to table and was trying to have conversation with family members about movies, actors,  And she could not get the words out.  Was aware but could not make it happen.  This lasted for 10 minutes.  No other sx of weakness, facial droop, numbness, tingling. She then went home and went  to sleep for 2 hours. Woke up and was back to baseline.  She did not take any med for this as these are so infrequent.   This was unusual, so she wanted to report this.   I went over stroke sx with her and she verbalized understanding of these and did not think this was that.  I told her I would relay to MM/NP, she may not see this tonight but tomorrow.  I relayed about my chart portal and she was not interested right now.

## 2016-06-05 NOTE — Telephone Encounter (Signed)
Patient called office in reference to having a strange occurrence during a slight headache on Sunday patient was having a conversation with family and she could not complete/get words out after going home patient went to sleep and slept for 2 hours.  Please call

## 2016-06-06 NOTE — Telephone Encounter (Signed)
I called the patient. She states that after church on Sunday she begin to have an ocular migraine. She states that she begin to have trouble speaking and getting her words out. However she states later she has her family and they did not notice any changes with her speech. She reports that it only lasted about 10 minutes. She then went home and slept for approximately 2 hours and when she awoke she felt back to normal. She states that she's never had any symptoms like this with her ocular migraines before. I advised the patient that this could be a complicated migraine however since these are new symptoms she will need to come in for an evaluation. She voiced understanding. We will try to get her into see Dr. Brett Fairy. She is advised that if she has any strokelike symptoms she should go to the emergency room. She voiced understanding.

## 2016-06-06 NOTE — Telephone Encounter (Signed)
Can she come on next Tuesday 3.30 Pm ?

## 2016-06-07 NOTE — Telephone Encounter (Signed)
Spoke to pt and scheduled her for 06-11-16 at 1530 be here 1515.  She verbalized understanding.

## 2016-06-10 NOTE — Telephone Encounter (Signed)
Pt placed in spot on 06/11/2016 at 3:30pm.

## 2016-06-11 ENCOUNTER — Encounter: Payer: Self-pay | Admitting: Neurology

## 2016-06-11 ENCOUNTER — Ambulatory Visit (INDEPENDENT_AMBULATORY_CARE_PROVIDER_SITE_OTHER): Payer: Medicare Other | Admitting: Neurology

## 2016-06-11 VITALS — BP 138/84 | HR 78 | Resp 20 | Ht 66.0 in | Wt 135.0 lb

## 2016-06-11 DIAGNOSIS — G3184 Mild cognitive impairment, so stated: Secondary | ICD-10-CM

## 2016-06-11 DIAGNOSIS — G43109 Migraine with aura, not intractable, without status migrainosus: Secondary | ICD-10-CM

## 2016-06-11 MED ORDER — DONEPEZIL HCL 10 MG PO TABS: 10.0000 mg | ORAL_TABLET | Freq: Every day | ORAL | 5 refills | Status: DC
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
## 2016-06-13 ENCOUNTER — Telehealth: Payer: Self-pay

## 2016-06-13 NOTE — Telephone Encounter (Signed)
I spoke to patient and she is aware of results below.

## 2016-06-13 NOTE — Telephone Encounter (Signed)
-----   Message from Larey Seat, MD sent at 06/12/2016 11:44 AM EDT ----- Normal metabolic panel and TSH, awaiting vit B12 levels. CD

## 2016-06-14 LAB — COMPREHENSIVE METABOLIC PANEL
ALBUMIN: 4.8 g/dL (ref 3.5–4.8)
ALK PHOS: 87 IU/L (ref 39–117)
ALT: 16 IU/L (ref 0–32)
AST: 15 IU/L (ref 0–40)
Albumin/Globulin Ratio: 2.1 (ref 1.2–2.2)
BILIRUBIN TOTAL: 0.4 mg/dL (ref 0.0–1.2)
BUN / CREAT RATIO: 14 (ref 12–28)
BUN: 12 mg/dL (ref 8–27)
CHLORIDE: 98 mmol/L (ref 96–106)
CO2: 26 mmol/L (ref 18–29)
Calcium: 10 mg/dL (ref 8.7–10.3)
Creatinine, Ser: 0.83 mg/dL (ref 0.57–1.00)
GFR calc Af Amer: 80 mL/min/{1.73_m2} (ref >59)
GFR calc non Af Amer: 70 mL/min/{1.73_m2} (ref >59)
GLUCOSE: 82 mg/dL (ref 65–99)
Globulin, Total: 2.3 g/dL (ref 1.5–4.5)
Potassium: 5 mmol/L (ref 3.5–5.2)
Sodium: 139 mmol/L (ref 134–144)
Total Protein: 7.1 g/dL (ref 6.0–8.5)

## 2016-06-14 LAB — METHYLMALONIC ACID, SERUM: METHYLMALONIC ACID: 388 nmol/L — AB (ref 0–378)

## 2016-06-14 LAB — TSH: TSH: 0.975 u[IU]/mL (ref 0.450–4.500)

## 2016-06-17 ENCOUNTER — Telehealth: Payer: Self-pay

## 2016-06-17 NOTE — Telephone Encounter (Signed)
-----   Message from Larey Seat, MD sent at 06/14/2016  1:02 PM EDT ----- Borderline B 12 level, please encourage B 12 supplement ( OTC )   CD

## 2016-06-17 NOTE — Telephone Encounter (Signed)
I called Kara Tapia, advised her that her B12 level was borderline, per Dr. Brett Fairy, and Dr. Brett Fairy encourages her to take a vitamin b12 supplement OTC. Kara Tapia says that she will discuss this further with Dr. Ashby Dawes before starting b12. Kara Tapia verbalized understanding of results. Kara Tapia had no questions at this time but was encouraged to call back if questions arise.

## 2016-07-16 DIAGNOSIS — L570 Actinic keratosis: Secondary | ICD-10-CM | POA: Diagnosis not present

## 2016-08-21 DIAGNOSIS — R072 Precordial pain: Secondary | ICD-10-CM | POA: Diagnosis not present

## 2016-08-21 DIAGNOSIS — G4762 Sleep related leg cramps: Secondary | ICD-10-CM | POA: Diagnosis not present

## 2016-08-21 DIAGNOSIS — J301 Allergic rhinitis due to pollen: Secondary | ICD-10-CM | POA: Diagnosis not present

## 2016-08-23 DIAGNOSIS — R0789 Other chest pain: Secondary | ICD-10-CM | POA: Diagnosis not present

## 2016-09-03 DIAGNOSIS — L570 Actinic keratosis: Secondary | ICD-10-CM | POA: Diagnosis not present

## 2016-09-18 DIAGNOSIS — K219 Gastro-esophageal reflux disease without esophagitis: Secondary | ICD-10-CM | POA: Diagnosis not present

## 2016-09-18 DIAGNOSIS — I739 Peripheral vascular disease, unspecified: Secondary | ICD-10-CM | POA: Diagnosis not present

## 2016-09-18 DIAGNOSIS — R413 Other amnesia: Secondary | ICD-10-CM | POA: Diagnosis not present

## 2016-10-08 ENCOUNTER — Ambulatory Visit: Payer: Medicare Other | Admitting: Neurology

## 2016-10-10 ENCOUNTER — Encounter (INDEPENDENT_AMBULATORY_CARE_PROVIDER_SITE_OTHER): Payer: Self-pay

## 2016-10-10 ENCOUNTER — Ambulatory Visit (INDEPENDENT_AMBULATORY_CARE_PROVIDER_SITE_OTHER): Payer: Medicare Other | Admitting: Adult Health

## 2016-10-10 ENCOUNTER — Encounter: Payer: Self-pay | Admitting: Adult Health

## 2016-10-10 VITALS — BP 128/78 | HR 67 | Ht 66.0 in | Wt 132.2 lb

## 2016-10-10 DIAGNOSIS — G4750 Parasomnia, unspecified: Secondary | ICD-10-CM | POA: Diagnosis not present

## 2016-10-10 DIAGNOSIS — R413 Other amnesia: Secondary | ICD-10-CM | POA: Diagnosis not present

## 2016-10-10 MED ORDER — DONEPEZIL HCL 5 MG PO TABS: 5.0000 mg | ORAL_TABLET | Freq: Every day | ORAL | 11 refills | Status: DC

## 2016-10-10 MED ORDER — CLONAZEPAM 0.5 MG PO TBDP: 0.5000 mg | ORAL_TABLET | Freq: Every evening | ORAL | 5 refills | Status: DC | PRN

## 2016-10-10 NOTE — Progress Notes (Signed)
Fax confirmation received for clonazepam [place] [phone]. sy

## 2016-10-10 NOTE — Progress Notes (Signed)
PATIENT: Kara Tapia DOB: May 25, 1940  REASON FOR VISIT: follow up- parasomnia, memory disturbance HISTORY FROM: patient  HISTORY OF PRESENT ILLNESS: Mr. Kara Tapia is a 76 year old female with a history of parasomnia and memory disturbance. She returns today for follow-up. She is currently on Aricept 10 mg daily. She reports that she has been having increasing episodes of diarrhea since she increased the medication. She lives at home alone. She is able to complete all ADLs independently. She operates a Teacher, music without difficulty. She manages her own finances and prepares her own meals. She continues to take clonazepam 0.5 mg at bedtime. She reports that this continues to help with her sleep although she does have some restless nights. She states that she does have vivid dreams although it is not always bothersome to her. She states that when she was on vacation with her daughter they said that she was screaming in her sleep. She returns today for an evaluation.  HISTORY 04/04/16: Kara Tapia is a 76 year old female with a history of parasomnia, headache, mild memory disturbance. She returns today for follow-up. The patient feels that her memory has remained stable. She is now taking Aricept 5 mg at bedtime. She reports that she is able to complete all ADLs independently. She operates a Teacher, music without difficulty. Able to prepare her own meals. Denies any trouble sleeping. Denies vivid dreams. She continues to take Klonopin at bedtime although she felt that it does not work as well as it initially. She reports that she does not take Klonopin every night. She reports that the night she does take it. Typically will only last for 3 hours then she will wake back up and say week for 2 hours then go back to sleep. Patient also states that her headaches have remained stable. She returns today for an evaluation.  REVIEW OF SYSTEMS: Out of a complete 14 system review of symptoms, the patient complains  only of the following symptoms, and all other reviewed systems are negative.  Runny nose, diarrhea, light sensitivity, muscle cramps  ALLERGIES: Allergies  Allergen Reactions  . Caine-1 [Lidocaine] Other (See Comments)    Affects her BP    HOME MEDICATIONS: Outpatient Medications Prior to Visit  Medication Sig Dispense Refill  . aspirin 75 MG chewable tablet Chew 1 tablet (75 mg total) by mouth daily. 30 tablet 5  . clonazePAM (KLONOPIN) 0.5 MG disintegrating tablet Take 1 tablet (0.5 mg total) by mouth at bedtime as needed. 30 tablet 5  . donepezil (ARICEPT) 10 MG tablet Take 1 tablet (10 mg total) by mouth at bedtime. 30 tablet 5   No facility-administered medications prior to visit.     PAST MEDICAL HISTORY: Past Medical History:  Diagnosis Date  . Arthritis   . Hematuria   . History of palpitations   . Migraine   . Migraine with status migrainosus 08/09/2014   Ocular migraines.  . Night terrors, adult   . Osteoarthritis   . PVD (peripheral vascular disease) ([place])   . Reflux esophagitis     PAST SURGICAL HISTORY: History reviewed. No pertinent surgical history.  FAMILY HISTORY: Family History  Problem Relation Age of Onset  . Diabetes Mother   . Heart disease Mother   . Cancer Mother   . Cancer Father   . Lung disease Sister     SOCIAL HISTORY: Social History   Social History  . Marital status: Single    Spouse name: N/A  . Number of children: 3  .  Years of education: 56   Occupational History  . Not on file.   Social History Main Topics  . Smoking status: Current Some Day Smoker    Packs/day: 0.50    Types: Cigarettes  . Smokeless tobacco: Never Used  . Alcohol use 3.0 oz/week    5 Glasses of wine per week  . Drug use: No  . Sexual activity: Not on file   Other Topics Concern  . Not on file   Social History Narrative   Drinks 3-4 cups of caffeine daily.      PHYSICAL EXAM  Vitals:   10/10/16 1310  BP: 128/78  Pulse: 67  Weight:  132 lb 3.2 oz (60 kg)  Height: 5\' 6"  (1.676 m)   Body mass index is 21.34 kg/m.   Montreal Cognitive Assessment  10/10/2016 04/04/2016 09/28/2015 05/22/2015 04/04/2015  Visuospatial/ Executive (0/5) 5 5 4 5 5   Naming (0/3) 3 3 3 3 3   Attention: Read list of digits (0/2) 2 2 2 2 1   Attention: Read list of letters (0/1) 1 1 1 1 1   Attention: Serial 7 subtraction starting at 100 (0/3) 2 3 3 1 2   Language: Repeat phrase (0/2) 2 2 2 2 1   Language : Fluency (0/1) 1 1 1 1 1   Abstraction (0/2) 2 2 2 2 1   Delayed Recall (0/5) 3 1 1 3 1   Orientation (0/6) 6 6 6 6 6   Total 27 26 25 26 22   Adjusted Score (based on education) - - 25 26 -     Generalized: Well developed, in no acute distress   Neurological examination  Mentation: Alert oriented to time, place, history taking. Follows all commands speech and language fluent Cranial nerve II-XII: Pupils were equal round reactive to light. Extraocular movements were full, visual field were full on confrontational test. Facial sensation and strength were normal. Uvula tongue midline. Head turning and shoulder shrug  were normal and symmetric. Motor: The motor testing reveals 5 over 5 strength of all 4 extremities. Good symmetric motor tone is noted throughout.  Sensory: Sensory testing is intact to soft touch on all 4 extremities. No evidence of extinction is noted.  Coordination: Cerebellar testing reveals good finger-nose-finger and heel-to-shin bilaterally.  Gait and station: Gait is normal. Tandem gait is normal. Romberg is negative. No drift is seen.  Reflexes: Deep tendon reflexes are symmetric and normal bilaterally.   DIAGNOSTIC DATA (LABS, IMAGING, TESTING) - I reviewed patient records, labs, notes, testing and imaging myself where available.  Lab Results  Component Value Date   WBC 6.6 03/24/2015   HGB 15.3 (H) 03/24/2015   HCT 45.1 03/24/2015   MCV 96.6 03/24/2015   PLT 255 03/24/2015      Component Value Date/Time   NA 139  06/11/2016 1657   K 5.0 06/11/2016 1657   CL 98 06/11/2016 1657   CO2 26 06/11/2016 1657   GLUCOSE 82 06/11/2016 1657   GLUCOSE 97 03/24/2015 0502   BUN 12 06/11/2016 1657   CREATININE 0.83 06/11/2016 1657   CALCIUM 10.0 06/11/2016 1657   PROT 7.1 06/11/2016 1657   ALBUMIN 4.8 06/11/2016 1657   AST 15 06/11/2016 1657   ALT 16 06/11/2016 1657   ALKPHOS 87 06/11/2016 1657   BILITOT 0.4 06/11/2016 1657   GFRNONAA 70 06/11/2016 1657   GFRAA 80 06/11/2016 1657   No results found for: CHOL, HDL, LDLCALC, LDLDIRECT, TRIG, CHOLHDL No results found for: HGBA1C No results found for: VITAMINB12 Lab Results  Component Value Date   TSH 0.975 06/11/2016      ASSESSMENT AND PLAN 76 y.o. year old female  has a past medical history of Arthritis; Hematuria; History of palpitations; Migraine; Migraine with status migrainosus (08/09/2014); Night terrors, adult; Osteoarthritis; PVD (peripheral vascular disease) ([place]); and Reflux esophagitis. here with:  1. Memory disturbance 2. Parasomnia  The patient's memory scores remained stable. We will decrease Aricept 5 mg to see if episodes of diarrhea subside. Also advised that she can try taking Aricept in the morning to see if the vivid dreams also decrease Patient voiced understanding. She will continue on clonazepam 0.5 mg at bedtime. She is advised that if her symptoms worsen or she develops new symptoms she should let us know. She will follow-up in 6 months or sooner if needed.     Ward Givens, MSN, NP-C 10/10/2016, 1:35 PM [facility] [address] [place], [place] [address] [phone]

## 2016-10-10 NOTE — Patient Instructions (Signed)
Your Plan:  Decrease Aricept to 5 mg daily. Try taking in the morning to see if vivid dreams decrease Continue Klonopin 0.5 mg at bedtime If your symptoms worsen or you develop new symptoms please let us know.    Thank you for coming to see [place] at [facility]. I hope we have been able to provide you high quality care today.  You may receive a patient satisfaction survey over the next few weeks. We would appreciate your feedback and comments so that we may continue to improve ourselves and the health of our patients.

## 2016-10-11 NOTE — Progress Notes (Signed)
I agree with the assessment and plan as directed by NP .The patient is known to me .   Mana Morison, MD  

## 2016-10-15 DIAGNOSIS — L570 Actinic keratosis: Secondary | ICD-10-CM | POA: Diagnosis not present

## 2016-12-03 DIAGNOSIS — L570 Actinic keratosis: Secondary | ICD-10-CM | POA: Diagnosis not present

## 2016-12-24 DIAGNOSIS — L814 Other melanin hyperpigmentation: Secondary | ICD-10-CM | POA: Diagnosis not present

## 2016-12-24 DIAGNOSIS — Z85828 Personal history of other malignant neoplasm of skin: Secondary | ICD-10-CM | POA: Diagnosis not present

## 2016-12-24 DIAGNOSIS — D225 Melanocytic nevi of trunk: Secondary | ICD-10-CM | POA: Diagnosis not present

## 2016-12-24 DIAGNOSIS — D1800 Hemangioma unspecified site: Secondary | ICD-10-CM | POA: Diagnosis not present

## 2017-01-14 DIAGNOSIS — I739 Peripheral vascular disease, unspecified: Secondary | ICD-10-CM | POA: Diagnosis not present

## 2017-01-14 DIAGNOSIS — Z0000 Encounter for general adult medical examination without abnormal findings: Secondary | ICD-10-CM | POA: Diagnosis not present

## 2017-01-14 DIAGNOSIS — R413 Other amnesia: Secondary | ICD-10-CM | POA: Diagnosis not present

## 2017-01-14 DIAGNOSIS — Z780 Asymptomatic menopausal state: Secondary | ICD-10-CM | POA: Diagnosis not present

## 2017-01-14 DIAGNOSIS — Z23 Encounter for immunization: Secondary | ICD-10-CM | POA: Diagnosis not present

## 2017-01-21 DIAGNOSIS — J449 Chronic obstructive pulmonary disease, unspecified: Secondary | ICD-10-CM | POA: Diagnosis not present

## 2017-01-21 DIAGNOSIS — R413 Other amnesia: Secondary | ICD-10-CM | POA: Diagnosis not present

## 2017-01-21 DIAGNOSIS — I739 Peripheral vascular disease, unspecified: Secondary | ICD-10-CM | POA: Diagnosis not present

## 2017-01-21 DIAGNOSIS — R030 Elevated blood-pressure reading, without diagnosis of hypertension: Secondary | ICD-10-CM | POA: Diagnosis not present

## 2017-01-29 DIAGNOSIS — L570 Actinic keratosis: Secondary | ICD-10-CM | POA: Diagnosis not present

## 2017-03-07 ENCOUNTER — Telehealth: Payer: Self-pay

## 2017-03-07 NOTE — Telephone Encounter (Signed)
I left a detailed message for patient to call back to reschedule her 04/15/17 OV with Megan due to Jinny Blossom being out of the office that afternoon for an appointment. She can see Megan or Dr. Brett Fairy at their next available opening.

## 2017-03-13 DIAGNOSIS — L570 Actinic keratosis: Secondary | ICD-10-CM | POA: Diagnosis not present

## 2017-04-15 ENCOUNTER — Ambulatory Visit: Payer: Medicare Other | Admitting: Adult Health

## 2017-04-24 DIAGNOSIS — L570 Actinic keratosis: Secondary | ICD-10-CM | POA: Diagnosis not present

## 2017-05-12 ENCOUNTER — Other Ambulatory Visit: Payer: Self-pay | Admitting: *Deleted

## 2017-05-12 MED ORDER — CLONAZEPAM 0.5 MG PO TBDP: 0.5000 mg | ORAL_TABLET | Freq: Every evening | ORAL | 5 refills | Status: DC | PRN

## 2017-05-13 NOTE — Telephone Encounter (Signed)
Fax confirmation received clonazepam, [place] [phone].

## 2017-06-04 ENCOUNTER — Ambulatory Visit (INDEPENDENT_AMBULATORY_CARE_PROVIDER_SITE_OTHER): Payer: Medicare Other | Admitting: Neurology

## 2017-06-04 ENCOUNTER — Ambulatory Visit: Payer: Medicare Other | Admitting: Neurology

## 2017-06-04 ENCOUNTER — Encounter: Payer: Self-pay | Admitting: Neurology

## 2017-06-04 VITALS — BP 128/77 | HR 86 | Ht 66.0 in | Wt 132.0 lb

## 2017-06-04 DIAGNOSIS — R413 Other amnesia: Secondary | ICD-10-CM

## 2017-06-04 DIAGNOSIS — G3184 Mild cognitive impairment, so stated: Secondary | ICD-10-CM | POA: Insufficient documentation

## 2017-06-04 DIAGNOSIS — R200 Anesthesia of skin: Secondary | ICD-10-CM | POA: Diagnosis not present

## 2017-06-04 DIAGNOSIS — G4752 REM sleep behavior disorder: Secondary | ICD-10-CM | POA: Diagnosis not present

## 2017-06-04 DIAGNOSIS — R202 Paresthesia of skin: Secondary | ICD-10-CM

## 2017-06-04 HISTORY — DX: Mild cognitive impairment of uncertain or unknown etiology: G31.84

## 2017-06-04 HISTORY — DX: REM sleep behavior disorder: G47.52

## 2017-06-04 MED ORDER — CLONAZEPAM 0.5 MG PO TBDP: ORAL_TABLET | ORAL | 5 refills | Status: DC

## 2017-06-04 MED ORDER — DONEPEZIL HCL 5 MG PO TABS: 5.0000 mg | ORAL_TABLET | Freq: Every day | ORAL | 11 refills | Status: DC

## 2017-06-04 NOTE — Addendum Note (Signed)
Addended by: Larey Seat on: 06/04/2017 02:29 PM   Modules accepted: Orders
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
## 2017-06-05 DIAGNOSIS — L570 Actinic keratosis: Secondary | ICD-10-CM | POA: Diagnosis not present

## 2017-06-05 DIAGNOSIS — H524 Presbyopia: Secondary | ICD-10-CM | POA: Diagnosis not present

## 2017-06-30 ENCOUNTER — Ambulatory Visit (INDEPENDENT_AMBULATORY_CARE_PROVIDER_SITE_OTHER): Payer: Medicare Other | Admitting: Neurology

## 2017-06-30 ENCOUNTER — Telehealth: Payer: Self-pay | Admitting: *Deleted

## 2017-06-30 ENCOUNTER — Encounter: Payer: Self-pay | Admitting: Neurology

## 2017-06-30 DIAGNOSIS — R200 Anesthesia of skin: Secondary | ICD-10-CM

## 2017-06-30 DIAGNOSIS — G4752 REM sleep behavior disorder: Secondary | ICD-10-CM

## 2017-06-30 DIAGNOSIS — R202 Paresthesia of skin: Secondary | ICD-10-CM

## 2017-06-30 DIAGNOSIS — G3184 Mild cognitive impairment, so stated: Secondary | ICD-10-CM

## 2017-06-30 DIAGNOSIS — R413 Other amnesia: Secondary | ICD-10-CM

## 2017-06-30 NOTE — Progress Notes (Signed)
Please refer to EMG and nerve conduction study procedure note. 

## 2017-06-30 NOTE — Telephone Encounter (Signed)
-----   Message from Larey Seat, MD sent at 06/30/2017  4:26 PM EDT ----- Only signs of previously partially healed carpal tunnel syndrome on the left wrist , but no neck level impingement

## 2017-06-30 NOTE — Procedures (Signed)
     HISTORY:  Kara Tapia is a 77 year old white female with a history of intermittent numbness of the hands, left greater than right that generally will come on with flexion of the elbow.  The patient reports no neck pain or pain down the arms on either side.  She has no weakness of the arms.  The patient is being evaluated for a possible neuropathy or a cervical radiculopathy.   NERVE CONDUCTION STUDIES:  Nerve conduction studies were performed on both upper extremities. The distal motor latencies and motor amplitudes for the median and ulnar nerves were within normal limits. The F wave latencies for the ulnar nerves and nerve conduction velocities for the median and ulnar nerves were also normal. The sensory latencies for the median and ulnar nerves were normal.   EMG STUDIES:  EMG study was performed on the left upper extremity:  The first dorsal interosseous muscle reveals 2 to 4 K units with full recruitment. No fibrillations or positive waves were noted. The abductor pollicis brevis muscle reveals 2 to 5 K units with decreased recruitment. No fibrillations or positive waves were noted. The extensor indicis proprius muscle reveals 1 to 3 K units with full recruitment. No fibrillations or positive waves were noted. The pronator teres muscle reveals 2 to 3 K units with full recruitment. No fibrillations or positive waves were noted. The biceps muscle reveals 1 to 2 K units with full recruitment. No fibrillations or positive waves were noted. The triceps muscle reveals 2 to 4 K units with full recruitment. No fibrillations or positive waves were noted. The anterior deltoid muscle reveals 2 to 3 K units with full recruitment. No fibrillations or positive waves were noted. The cervical paraspinal muscles were tested at 2 levels. No abnormalities of insertional activity were seen at either level tested. There was good relaxation.   IMPRESSION:  Nerve conduction studies done on both upper  extremities were within normal limits.  No evidence of a neuropathy is seen.  EMG of the left upper extremity was unremarkable with exception of isolated chronic stable denervation of the APB muscle.  This finding could be consistent with a prior healed carpal tunnel syndrome.  There is no evidence of an overlying left cervical radiculopathy.  Jill Alexanders MD 06/30/2017 4:13 PM  [facility] [address] [place], [place] [address]  Phone [phone] Fax [phone]

## 2017-06-30 NOTE — Telephone Encounter (Signed)
Spoke with Dr. Jannifer Franklin. He already spoke with patient about EMG.NCS results.

## 2017-07-01 NOTE — Progress Notes (Signed)
[facility]    Nerve / Sites Muscle Latency Ref. Amplitude Ref. Rel Amp Segments Distance Velocity Ref. Area    ms ms mV mV %  cm m/s m/s mVms  R Median - APB     Wrist APB 3.0 ?4.4 4.9 ?4.0 100 Wrist - APB 7   17.4     Upper arm APB 6.6  5.0  100 Upper arm - Wrist 20 56 ?49 17.4  L Median - APB     Wrist APB 3.1 ?4.4 6.1 ?4.0 100 Wrist - APB 7   20.7     Upper arm APB 6.6  6.2  103 Upper arm - Wrist 20 56 ?49 22.1  R Ulnar - ADM     Wrist ADM 2.4 ?3.3 10.2 ?6.0 100 Wrist - ADM 7   28.0     B.Elbow ADM 5.3  9.9  96.8 B.Elbow - Wrist 17 58 ?49 27.2     A.Elbow ADM 7.1  9.9  100 A.Elbow - B.Elbow 10 55 ?49 27.4         A.Elbow - Wrist      L Ulnar - ADM     Wrist ADM 2.6 ?3.3 8.3 ?6.0 100 Wrist - ADM 7   22.9     B.Elbow ADM 5.5  7.7  92.4 B.Elbow - Wrist 17 58 ?49 21.8     A.Elbow ADM 7.3  7.9  103 A.Elbow - B.Elbow 10 56 ?49 22.4         A.Elbow - Wrist                 SNC    Nerve / Sites Rec. Site Peak Lat Ref.  Amp Ref. Segments Distance Peak Diff Ref.    ms ms V V  cm ms ms  R Median, Ulnar - Transcarpal comparison     Median Palm Wrist 2.1 ?2.2 75 ?35 Median Palm - Wrist 8       Ulnar Palm Wrist 1.8 ?2.2 16 ?12 Ulnar Palm - Wrist 8          Median Palm - Ulnar Palm  0.3 ?0.4  L Median, Ulnar - Transcarpal comparison     Median Palm Wrist 2.0 ?2.2 84 ?35 Median Palm - Wrist 8       Ulnar Palm Wrist 2.0 ?2.2 19 ?12 Ulnar Palm - Wrist 8          Median Palm - Ulnar Palm  0.1 ?0.4  R Median - Orthodromic (Dig II, Mid palm)     Dig II Wrist 3.0 ?3.4 11 ?10 Dig II - Wrist 13    L Median - Orthodromic (Dig II, Mid palm)     Dig II Wrist 2.9 ?3.4 17 ?10 Dig II - Wrist 13    R Ulnar - Orthodromic, (Dig V, Mid palm)     Dig V Wrist 2.7 ?3.1 5 ?5 Dig V - Wrist 11    L Ulnar - Orthodromic, (Dig V, Mid palm)     Dig V Wrist 2.6 ?3.1 14 ?5 Dig V - Wrist 4                   F  Wave    Nerve F Lat Ref.   ms ms  R Ulnar - ADM 26.6 ?32.0  L Ulnar - ADM 26.2 ?32.0         EMG full   EMG Summary Table    Spontaneous MUAP Recruitment  Muscle  IA Fib PSW Fasc Other Amp Dur. Poly Pattern  L. Abductor digiti minimi (manus) Normal None None None _______ Normal Normal Normal Normal

## 2017-07-15 DIAGNOSIS — I739 Peripheral vascular disease, unspecified: Secondary | ICD-10-CM | POA: Diagnosis not present

## 2017-07-15 DIAGNOSIS — R030 Elevated blood-pressure reading, without diagnosis of hypertension: Secondary | ICD-10-CM | POA: Diagnosis not present

## 2017-07-17 DIAGNOSIS — L570 Actinic keratosis: Secondary | ICD-10-CM | POA: Diagnosis not present

## 2017-07-22 DIAGNOSIS — J449 Chronic obstructive pulmonary disease, unspecified: Secondary | ICD-10-CM | POA: Diagnosis not present

## 2017-07-22 DIAGNOSIS — M25511 Pain in right shoulder: Secondary | ICD-10-CM | POA: Diagnosis not present

## 2017-07-22 DIAGNOSIS — M542 Cervicalgia: Secondary | ICD-10-CM | POA: Diagnosis not present

## 2017-07-22 DIAGNOSIS — Z23 Encounter for immunization: Secondary | ICD-10-CM | POA: Diagnosis not present

## 2017-07-22 DIAGNOSIS — I739 Peripheral vascular disease, unspecified: Secondary | ICD-10-CM | POA: Diagnosis not present

## 2017-07-22 DIAGNOSIS — E782 Mixed hyperlipidemia: Secondary | ICD-10-CM | POA: Diagnosis not present

## 2017-09-09 DIAGNOSIS — M25511 Pain in right shoulder: Secondary | ICD-10-CM | POA: Diagnosis not present

## 2017-09-11 DIAGNOSIS — L570 Actinic keratosis: Secondary | ICD-10-CM | POA: Diagnosis not present

## 2017-10-16 DIAGNOSIS — L570 Actinic keratosis: Secondary | ICD-10-CM | POA: Diagnosis not present

## 2017-11-20 DIAGNOSIS — L570 Actinic keratosis: Secondary | ICD-10-CM | POA: Diagnosis not present

## 2017-12-01 ENCOUNTER — Telehealth: Payer: Self-pay | Admitting: *Deleted

## 2017-12-01 NOTE — Telephone Encounter (Signed)
Pt called, wanted to know if she had been released from Dr. Brett Fairy. Please call [phone]

## 2017-12-01 NOTE — Telephone Encounter (Signed)
Called the patient, I see nothing that would indicate the patient is not being followed by Dr Brett Fairy. Patient seemed slightly confused thinking that she may had an apt she had missed when she was out of town. I informed her that I didn't see where that happened. It looked like she was due for an apt. Patient went ahead and scheduled an apt with me for tomorrow at 3:30 pm with arrival of 3 pm. Pt verbalized understanding. Pt had no questions at this time but was encouraged to call back if questions arise.

## 2017-12-02 ENCOUNTER — Ambulatory Visit (INDEPENDENT_AMBULATORY_CARE_PROVIDER_SITE_OTHER): Payer: Medicare Other | Admitting: Neurology

## 2017-12-02 ENCOUNTER — Encounter: Payer: Self-pay | Admitting: Neurology

## 2017-12-02 VITALS — Ht 66.0 in | Wt 132.0 lb

## 2017-12-02 DIAGNOSIS — R413 Other amnesia: Secondary | ICD-10-CM | POA: Diagnosis not present

## 2017-12-02 DIAGNOSIS — G4752 REM sleep behavior disorder: Secondary | ICD-10-CM | POA: Diagnosis not present

## 2017-12-02 DIAGNOSIS — G3184 Mild cognitive impairment, so stated: Secondary | ICD-10-CM

## 2017-12-02 DIAGNOSIS — F800 Phonological disorder: Secondary | ICD-10-CM

## 2017-12-02 HISTORY — DX: Phonological disorder: F80.0

## 2017-12-02 MED ORDER — DONEPEZIL HCL 5 MG PO TABS: 5.0000 mg | ORAL_TABLET | Freq: Every morning | ORAL | 11 refills | Status: DC

## 2017-12-02 MED ORDER — CLONAZEPAM 1 MG PO TABS: 0.5000 mg | ORAL_TABLET | Freq: Every day | ORAL | 1 refills | Status: DC
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
## 2017-12-23 DIAGNOSIS — D485 Neoplasm of uncertain behavior of skin: Secondary | ICD-10-CM | POA: Diagnosis not present

## 2017-12-23 DIAGNOSIS — L570 Actinic keratosis: Secondary | ICD-10-CM | POA: Diagnosis not present

## 2017-12-23 DIAGNOSIS — D225 Melanocytic nevi of trunk: Secondary | ICD-10-CM | POA: Diagnosis not present

## 2017-12-23 DIAGNOSIS — C44511 Basal cell carcinoma of skin of breast: Secondary | ICD-10-CM | POA: Diagnosis not present

## 2017-12-23 DIAGNOSIS — C44519 Basal cell carcinoma of skin of other part of trunk: Secondary | ICD-10-CM | POA: Diagnosis not present

## 2017-12-23 DIAGNOSIS — Z85828 Personal history of other malignant neoplasm of skin: Secondary | ICD-10-CM | POA: Diagnosis not present

## 2018-01-01 DIAGNOSIS — L570 Actinic keratosis: Secondary | ICD-10-CM | POA: Diagnosis not present

## 2018-01-08 DIAGNOSIS — L905 Scar conditions and fibrosis of skin: Secondary | ICD-10-CM | POA: Diagnosis not present

## 2018-01-08 DIAGNOSIS — C44511 Basal cell carcinoma of skin of breast: Secondary | ICD-10-CM | POA: Diagnosis not present

## 2018-01-22 DIAGNOSIS — C44519 Basal cell carcinoma of skin of other part of trunk: Secondary | ICD-10-CM | POA: Diagnosis not present

## 2018-01-22 DIAGNOSIS — Z5189 Encounter for other specified aftercare: Secondary | ICD-10-CM | POA: Diagnosis not present

## 2018-01-22 DIAGNOSIS — I10 Essential (primary) hypertension: Secondary | ICD-10-CM | POA: Diagnosis not present

## 2018-01-27 DIAGNOSIS — Z0000 Encounter for general adult medical examination without abnormal findings: Secondary | ICD-10-CM | POA: Diagnosis not present

## 2018-01-27 DIAGNOSIS — E782 Mixed hyperlipidemia: Secondary | ICD-10-CM | POA: Diagnosis not present

## 2018-01-27 DIAGNOSIS — J449 Chronic obstructive pulmonary disease, unspecified: Secondary | ICD-10-CM | POA: Diagnosis not present

## 2018-01-27 DIAGNOSIS — I739 Peripheral vascular disease, unspecified: Secondary | ICD-10-CM | POA: Diagnosis not present

## 2018-02-05 DIAGNOSIS — L570 Actinic keratosis: Secondary | ICD-10-CM | POA: Diagnosis not present

## 2018-03-11 DIAGNOSIS — Z0000 Encounter for general adult medical examination without abnormal findings: Secondary | ICD-10-CM | POA: Diagnosis not present

## 2018-03-11 DIAGNOSIS — R6889 Other general symptoms and signs: Secondary | ICD-10-CM | POA: Diagnosis not present

## 2018-03-11 DIAGNOSIS — J329 Chronic sinusitis, unspecified: Secondary | ICD-10-CM | POA: Diagnosis not present

## 2018-04-02 ENCOUNTER — Encounter (HOSPITAL_COMMUNITY): Payer: Self-pay | Admitting: Emergency Medicine

## 2018-04-02 ENCOUNTER — Other Ambulatory Visit: Payer: Self-pay

## 2018-04-02 ENCOUNTER — Ambulatory Visit (HOSPITAL_COMMUNITY)
Admission: EM | Admit: 2018-04-02 | Discharge: 2018-04-02 | Disposition: A | Discharge status: Home / Self Care | Payer: Medicare Other | Attending: Family Medicine | Admitting: Family Medicine

## 2018-04-02 DIAGNOSIS — L570 Actinic keratosis: Secondary | ICD-10-CM | POA: Diagnosis not present

## 2018-04-02 DIAGNOSIS — W5501XA Bitten by cat, initial encounter: Secondary | ICD-10-CM | POA: Diagnosis not present

## 2018-04-02 DIAGNOSIS — Z23 Encounter for immunization: Secondary | ICD-10-CM

## 2018-04-02 DIAGNOSIS — S61452A Open bite of left hand, initial encounter: Secondary | ICD-10-CM | POA: Diagnosis not present

## 2018-04-02 MED ORDER — TETANUS-DIPHTH-ACELL PERTUSSIS 5-2.5-18.5 LF-MCG/0.5 IM SUSP
0.5000 mL | Freq: Once | INTRAMUSCULAR | Status: AC
  Administered 2018-04-02: 0.5 mL via INTRAMUSCULAR

## 2018-04-02 MED ORDER — TETANUS-DIPHTH-ACELL PERTUSSIS 5-2.5-18.5 LF-MCG/0.5 IM SUSP
INTRAMUSCULAR | Status: AC
  Filled 2018-04-02: qty 0.5

## 2018-04-02 MED ORDER — AMOXICILLIN-POT CLAVULANATE 875-125 MG PO TABS: 1.0000 | ORAL_TABLET | Freq: Two times a day (BID) | ORAL | 0 refills | Status: DC
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
## 2018-04-02 NOTE — Discharge Instructions (Addendum)
Meds ordered this encounter  Medications   Tdap (BOOSTRIX) injection 0.5 mL    

## 2018-04-07 NOTE — ED Provider Notes (Signed)
[facility]   [id number] 04/02/18 Arrival Time: 1656  ASSESSMENT & PLAN:  1. Cat bite of hand, left, initial encounter     Meds ordered this encounter  Medications  . Tdap (BOOSTRIX) injection 0.5 mL  . amoxicillin-clavulanate (AUGMENTIN) 875-125 MG tablet    Sig: Take 1 tablet by mouth every 12 (twelve) hours.    Dispense:  20 tablet    Refill:  0   Watch for s/s of infection.  Reviewed expectations re: course of current medical issues. Questions answered. Outlined signs and symptoms indicating need for more acute intervention. Patient verbalized understanding. After Visit Summary given.   SUBJECTIVE:  Kara Tapia is a 78 y.o. female who presents with a cat bite of her L hand several hours ago. Her own cat; new from a shelter. Minimal bleeding; easily controlled. Is painful. No ROM of wrist/hand/fingers. Minimal swelling. Td UTD: No.  ROS: As per HPI.   OBJECTIVE:  Vitals:   04/02/18 1726 04/02/18 1729  BP: (!) 166/94   Pulse: 79   Resp: 17   Temp: 97.6 F (36.4 C)   TempSrc: Oral   SpO2: 100%   Weight:  59 kg  Height:  5\' 6"  (1.676 m)    General appearance: alert; no distress Skin: two puncture wounds of her left hand with minimal surrounding - palmar at 3rd finger and ulnar side distally; some tenderness; FROM of wrist and all fingers; normal capillary refill; normal distal sensation of fingers Psychological: alert and cooperative; normal mood and affect   Allergies  Allergen Reactions  . Caine-1 [Lidocaine] Other (See Comments)    Affects her BP    Past Medical History:  Diagnosis Date  . Arthritis   . Hematuria   . History of palpitations   . Migraine   . Migraine with status migrainosus 08/09/2014   Ocular migraines.  . Night terrors, adult   . Osteoarthritis   . PVD (peripheral vascular disease) ([place])   . Reflux esophagitis    Social History   Socioeconomic History  . Marital status: Single    Spouse name: Not on file  .  Number of children: 3  . Years of education: 78  . Highest education level: Not on file  Occupational History  . Not on file  Social Needs  . Financial resource strain: Not on file  . Food insecurity:    Worry: Not on file    Inability: Not on file  . Transportation needs:    Medical: Not on file    Non-medical: Not on file  Tobacco Use  . Smoking status: Current Some Day Smoker    Packs/day: 0.50    Types: Cigarettes  . Smokeless tobacco: Never Used  Substance and Sexual Activity  . Alcohol use: Yes    Alcohol/week: 5.0 standard drinks    Types: 5 Glasses of wine per week  . Drug use: No  . Sexual activity: Not on file  Lifestyle  . Physical activity:    Days per week: Not on file    Minutes per session: Not on file  . Stress: Not on file  Relationships  . Social connections:    Talks on phone: Not on file    Gets together: Not on file    Attends religious service: Not on file    Active member of club or organization: Not on file    Attends meetings of clubs or organizations: Not on file    Relationship status: Not on file  Other Topics Concern  . Not on file  Social History Narrative   Drinks 3-4 cups of caffeine daily.         Vanessa Kick, MD 04/08/18 1001

## 2018-04-08 DIAGNOSIS — H903 Sensorineural hearing loss, bilateral: Secondary | ICD-10-CM | POA: Diagnosis not present

## 2018-04-28 DIAGNOSIS — Z8742 Personal history of other diseases of the female genital tract: Secondary | ICD-10-CM | POA: Diagnosis not present

## 2018-04-28 DIAGNOSIS — R350 Frequency of micturition: Secondary | ICD-10-CM | POA: Diagnosis not present

## 2018-04-28 DIAGNOSIS — Z01419 Encounter for gynecological examination (general) (routine) without abnormal findings: Secondary | ICD-10-CM | POA: Diagnosis not present

## 2018-04-28 DIAGNOSIS — Z6821 Body mass index (BMI) 21.0-21.9, adult: Secondary | ICD-10-CM | POA: Diagnosis not present

## 2018-04-28 DIAGNOSIS — Z1231 Encounter for screening mammogram for malignant neoplasm of breast: Secondary | ICD-10-CM | POA: Diagnosis not present

## 2018-05-14 DIAGNOSIS — L570 Actinic keratosis: Secondary | ICD-10-CM | POA: Diagnosis not present

## 2018-06-03 ENCOUNTER — Encounter: Payer: Self-pay | Admitting: Adult Health

## 2018-06-03 ENCOUNTER — Ambulatory Visit: Payer: Medicare Other

## 2018-06-03 ENCOUNTER — Other Ambulatory Visit: Payer: Self-pay

## 2018-06-03 ENCOUNTER — Ambulatory Visit: Payer: Medicare Other | Admitting: Adult Health

## 2018-06-03 VITALS — BP 126/79 | HR 76 | Ht 66.0 in | Wt 133.0 lb

## 2018-06-03 DIAGNOSIS — G3184 Mild cognitive impairment, so stated: Secondary | ICD-10-CM

## 2018-06-03 DIAGNOSIS — G4752 REM sleep behavior disorder: Secondary | ICD-10-CM

## 2018-06-03 DIAGNOSIS — G43109 Migraine with aura, not intractable, without status migrainosus: Secondary | ICD-10-CM | POA: Diagnosis not present

## 2018-06-03 NOTE — Progress Notes (Signed)
PATIENT: Kara Tapia DOB: 03-14-1941  REASON FOR VISIT: follow up- headache, mild memory disturbance, parasomnia HISTORY FROM: Patient  HPI: 06/03/18 VISIT  Kara Tapia is a 78 year old female who is being seen today for evaluation of memory concerns and REM sleep behavior disorder.  She continues on Aricept 5 mg nightly and reports stabilization of memory with today's Moca score 30/30.  She continues to care for her adult daughter who has Down syndrome and is now experiencing dementia with behaviors.  Patient states this has increased difficulty level and caring for her daughter and increases stress.  She does report optic migraine approximately 1.5 weeks ago that lasted for approximately 20 minutes and she believes this was likely due to increased stress caring for her daughter.  She endorses recurrent migraines approximately 2 times in a 3 to 70-month period.  She continues to take Klonopin as needed to help with sleep.  She is not comfortable taking this medication nightly and denies any recent REM sleep behavior disorder symptoms.  No further concerns at this time.  She returns today for reevaluation.   VISIT HISTORY 12/02/2017  Office note copied from Dr. Edwena Felty office visit for reference purposes  I am seeing Kara Tapia today on 02 December 2017 and a revisit on follow-up directed to address her own memory concerns, her intermittent numbness of the hands, and also to discuss her caretaker experience.  Dr. Jannifer Tapia evaluated the patient's hand numbness on 30 June 2017 this was an unremarkable study but she had isolated chronic and stable denervation of the abductor pollicis brevis that means she had at one time Carpal Tunnel syndrome but has recovered - however some of the nerves have never fully resumed the previous function. There was no overlying radiculopathy.  We briefly about  her adult daughter with Down syndrome, who has been in a wonderful small size group home with 6  inhabitants for many years.  Kara Tapia has developed some dementia and she is no longer participating in group activities she also has begun to treat some of her neighbors and caretakers differently.  They describe her as more agitated, impulsive to some degree to be more unpleasant.  She is also no longer is easy to guide.  I think this is a part of behavior changes related to dementia, but it is very hard for Kara Tapia because Kara Tapia has told her that she wants to return home.  I strongly advised on medical reasons against this and I have explained that at 78 years of age she should not be a 24/7 caretaker for Kara Tapia anymore.  Kara Tapia condition will also deteriorate and will be harder to fulfill the psychological and physical caretaker duties. We also performed today a Montreal cognitive assessment which she scored at 57 out of 30 points -in normal range.    REVIEW OF SYSTEMS: Out of a complete 14 system review of symptoms, the patient complains only of the following symptoms, and all other reviewed systems are negative: Hearing loss, insomnia and headache  ALLERGIES: Allergies  Allergen Reactions  . Caine-1 [Lidocaine] Other (See Comments)    Affects her BP    HOME MEDICATIONS: Outpatient Medications Prior to Visit  Medication Sig Dispense Refill  . aspirin 75 MG chewable tablet Chew 1 tablet (75 mg total) by mouth daily. (Patient taking differently: Chew 75 mg by mouth as needed. ) 30 tablet 5  . clonazePAM (KLONOPIN) 1 MG tablet Take 0.5-1 tablets (0.5-1 mg total) by mouth at bedtime. As  needed at bedtime for REM BD. 90 tablet 1  . donepezil (ARICEPT) 5 MG tablet Take 1 tablet (5 mg total) by mouth every morning. 30 tablet 11  . amoxicillin-clavulanate (AUGMENTIN) 875-125 MG tablet Take 1 tablet by mouth every 12 (twelve) hours. (Patient not taking: Reported on 06/03/2018) 20 tablet 0   No facility-administered medications prior to visit.     PAST MEDICAL HISTORY: Past Medical History:   Diagnosis Date  . Arthritis   . Hematuria   . History of palpitations   . Migraine   . Migraine with status migrainosus 08/09/2014   Ocular migraines.  . Night terrors, adult   . Osteoarthritis   . PVD (peripheral vascular disease) ([place])   . Reflux esophagitis     PAST SURGICAL HISTORY: History reviewed. No pertinent surgical history.  FAMILY HISTORY: daughter Kara Tapia has Down's, is 6 and has dementia.   Family History  Problem Relation Age of Onset  . Diabetes Mother   . Heart disease Mother   . Cancer Mother   . Cancer Father   . Lung disease Sister     SOCIAL HISTORY: Social History   Socioeconomic History  . Marital status: Single    Spouse name: Not on file  . Number of children: 3  . Years of education: 29  . Highest education level: Not on file  Occupational History  . Not on file  Social Needs  . Financial resource strain: Not on file  . Food insecurity:    Worry: Not on file    Inability: Not on file  . Transportation needs:    Medical: Not on file    Non-medical: Not on file  Tobacco Use  . Smoking status: Current Some Day Smoker    Packs/day: 0.50    Types: Cigarettes  . Smokeless tobacco: Never Used  Substance and Sexual Activity  . Alcohol use: Yes    Alcohol/week: 5.0 standard drinks    Types: 5 Glasses of wine per week  . Drug use: No  . Sexual activity: Not on file  Lifestyle  . Physical activity:    Days per week: Not on file    Minutes per session: Not on file  . Stress: Not on file  Relationships  . Social connections:    Talks on phone: Not on file    Gets together: Not on file    Attends religious service: Not on file    Active member of club or organization: Not on file    Attends meetings of clubs or organizations: Not on file    Relationship status: Not on file  . Intimate partner violence:    Fear of current or ex partner: Not on file    Emotionally abused: Not on file    Physically abused: Not on file    Forced sexual  activity: Not on file  Other Topics Concern  . Not on file  Social History Narrative   Drinks 3-4 cups of caffeine daily.      PHYSICAL EXAM  Vitals:   06/03/18 1355  BP: 126/79  Pulse: 76  Weight: 133 lb (60.3 kg)  Height: 5\' 6"  (1.676 m)   Body mass index is 21.47 kg/m.   Montreal Cognitive Assessment  06/03/2018 12/02/2017 06/04/2017 10/10/2016 04/04/2016  Visuospatial/ Executive (0/5) 5 5 5 5 5   Naming (0/3) 3 3 3 3 3   Attention: Read list of digits (0/2) 2 2 2 2 2   Attention: Read list of letters (0/1) 1 1  1 1 1   Attention: Serial 7 subtraction starting at 100 (0/3) 3 3 2 2 3   Language: Repeat phrase (0/2) 2 1 0 2 2  Language : Fluency (0/1) 1 1 1 1 1   Abstraction (0/2) 2 2 2 2 2   Delayed Recall (0/5) 5 3 4 3 1   Orientation (0/6) 6 6 6 6 6   Total 30 27 26 27 26   Adjusted Score (based on education) 30 - - - -    Generalized: Well developed, pleasant elderly Caucasian female, in no acute distress   Neurological examination  Mentation: Alert oriented to time, place, history taking. Follows all commands speech and language fluent.  Speech is fluent, but I noted a slight lisp. Montreal Cognitive Assessment  06/03/2018 12/02/2017 06/04/2017 10/10/2016 04/04/2016  Visuospatial/ Executive (0/5) 5 5 5 5 5   Naming (0/3) 3 3 3 3 3   Attention: Read list of digits (0/2) 2 2 2 2 2   Attention: Read list of letters (0/1) 1 1 1 1 1   Attention: Serial 7 subtraction starting at 100 (0/3) 3 3 2 2 3   Language: Repeat phrase (0/2) 2 1 0 2 2  Language : Fluency (0/1) 1 1 1 1 1   Abstraction (0/2) 2 2 2 2 2   Delayed Recall (0/5) 5 3 4 3 1   Orientation (0/6) 6 6 6 6 6   Total 30 27 26 27 26   Adjusted Score (based on education) 30 - - - -    Cranial nerve II-XII: Pupils were equal round reactive to light. Extraocular movements were full, visual field were full on confrontational test. Facial sensation and strength were normal. Head turning and shoulder shrug  were symmetric. Motor: full  strength in all 4 extremities, preserved grip and pinch strength, symmetric motor tone is noted throughout.  Mild cogwheeling present over biceps area.  Unable to appreciate cogwheeling in wrists bilaterally Coordination:  finger-nose-intact bilaterally along with heel-to-shin testing without evidence of tremor or ataxia Gait and station: Gait is intact.  Able to stand without difficulty.  Ambulates independently without use of assistive device Reflexes: Deep tendon reflexes are symmetric bilaterally, 2 plus , no clonus. Marland Kitchen   DIAGNOSTIC DATA (LABS, IMAGING, TESTING) - I reviewed patient records, labs, notes, testing and imaging myself where available.    Lab Results  Component Value Date   WBC 6.6 03/24/2015   HGB 15.3 (H) 03/24/2015   HCT 45.1 03/24/2015   MCV 96.6 03/24/2015   PLT 255 03/24/2015      Component Value Date/Time   NA 139 06/11/2016 1657   K 5.0 06/11/2016 1657   CL 98 06/11/2016 1657   CO2 26 06/11/2016 1657   GLUCOSE 82 06/11/2016 1657   GLUCOSE 97 03/24/2015 0502   BUN 12 06/11/2016 1657   CREATININE 0.83 06/11/2016 1657   CALCIUM 10.0 06/11/2016 1657   PROT 7.1 06/11/2016 1657   ALBUMIN 4.8 06/11/2016 1657   AST 15 06/11/2016 1657   ALT 16 06/11/2016 1657   ALKPHOS 87 06/11/2016 1657   BILITOT 0.4 06/11/2016 1657   GFRNONAA 70 06/11/2016 1657   GFRAA 80 06/11/2016 1657   No results found for: CHOL, HDL, LDLCALC, LDLDIRECT, TRIG, CHOLHDL No results found for: HGBA1C No results found for: VITAMINB12 Lab Results  Component Value Date   TSH 0.975 06/11/2016      ASSESSMENT AND PLAN:  78 y.o. year old caucasian female patient here with follow-up regarding optic migraines, MCI and REM behavior disorder.  Optic migraine stable and  experiences only on occasion with increased stressful events.  No need for medication management at this time.  MCI stable with today's Moca 30/30 and no subjective report of memory changes.  At prior visit, is recommended to  increase Aricept from 5 mg to 10 mg but due to reported loose stools, it was recommended to discontinue magnesium and to increase Aricept once loose stool results but apparently dosage was not increased and she continues on Aricept 5 mg daily.  Due to stable memory objectively and subjectively, recommended to continue Aricept 5 mg daily at this time and can consider increasing in the future if needed.  Continue Klonopin as needed at night for REM behavior disorder but currently stable without any reported episodes.  No evidence of worsening muscle tone or cogwheel rigidity.  She will follow-up with Dr. Brett Fairy in 6 months time or call earlier if needed  Greater than 50% of this 25-minute visit was spent reviewing Moca and cognitive impairment, treatment for REM sleep behavior disorder and migraine management.  All questions answered to patient satisfaction during appointment  Venancio Poisson, [facility]  [facility] [address] [place], [place] [address]  Phone [phone] Fax [phone] Note: This document was prepared with digital dictation and possible smart phrase technology. Any transcriptional errors that result from this process are unintentional.

## 2018-06-03 NOTE — Patient Instructions (Signed)
Your Plan:  Continue current dose of Aricept and Klonopin   Follow up with Dr. Brett Fairy in 6 months time      Thank you for coming to see [place] at [facility]. I hope we have been able to provide you high quality care today.  You may receive a patient satisfaction survey over the next few weeks. We would appreciate your feedback and comments so that we may continue to improve ourselves and the health of our patients.

## 2018-07-09 ENCOUNTER — Other Ambulatory Visit: Payer: Self-pay | Admitting: Neurology

## 2018-10-05 ENCOUNTER — Other Ambulatory Visit: Payer: Self-pay

## 2018-10-05 ENCOUNTER — Emergency Department (HOSPITAL_COMMUNITY): Payer: Medicare Other

## 2018-10-05 ENCOUNTER — Encounter (HOSPITAL_COMMUNITY): Payer: Self-pay | Admitting: Emergency Medicine

## 2018-10-05 ENCOUNTER — Emergency Department (HOSPITAL_COMMUNITY)
Admission: EM | Admit: 2018-10-05 | Discharge: 2018-10-06 | Disposition: A | Discharge status: Home / Self Care | Payer: Medicare Other | Attending: Emergency Medicine | Admitting: Emergency Medicine

## 2018-10-05 DIAGNOSIS — R110 Nausea: Secondary | ICD-10-CM | POA: Diagnosis not present

## 2018-10-05 DIAGNOSIS — F17210 Nicotine dependence, cigarettes, uncomplicated: Secondary | ICD-10-CM | POA: Diagnosis not present

## 2018-10-05 DIAGNOSIS — Z79899 Other long term (current) drug therapy: Secondary | ICD-10-CM | POA: Insufficient documentation

## 2018-10-05 DIAGNOSIS — R358 Other polyuria: Secondary | ICD-10-CM | POA: Insufficient documentation

## 2018-10-05 DIAGNOSIS — R748 Abnormal levels of other serum enzymes: Secondary | ICD-10-CM

## 2018-10-05 DIAGNOSIS — R319 Hematuria, unspecified: Secondary | ICD-10-CM

## 2018-10-05 DIAGNOSIS — I160 Hypertensive urgency: Secondary | ICD-10-CM | POA: Diagnosis not present

## 2018-10-05 DIAGNOSIS — Z20828 Contact with and (suspected) exposure to other viral communicable diseases: Secondary | ICD-10-CM | POA: Insufficient documentation

## 2018-10-05 DIAGNOSIS — R3589 Other polyuria: Secondary | ICD-10-CM

## 2018-10-05 DIAGNOSIS — R05 Cough: Secondary | ICD-10-CM | POA: Diagnosis not present

## 2018-10-05 LAB — URINALYSIS, ROUTINE W REFLEX MICROSCOPIC
Bilirubin Urine: NEGATIVE
Glucose, UA: NEGATIVE mg/dL
Ketones, ur: NEGATIVE mg/dL
Leukocytes,Ua: NEGATIVE
Nitrite: NEGATIVE
Protein, ur: NEGATIVE mg/dL
Specific Gravity, Urine: 1.005 (ref 1.005–1.030)
pH: 6 (ref 5.0–8.0)

## 2018-10-05 LAB — COMPREHENSIVE METABOLIC PANEL
ALT: 20 U/L (ref 0–44)
AST: 22 U/L (ref 15–41)
Albumin: 4.9 g/dL (ref 3.5–5.0)
Alkaline Phosphatase: 89 U/L (ref 38–126)
Anion gap: 11 (ref 5–15)
BUN: 12 mg/dL (ref 8–23)
CO2: 26 mmol/L (ref 22–32)
Calcium: 9.9 mg/dL (ref 8.9–10.3)
Chloride: 101 mmol/L (ref 98–111)
Creatinine, Ser: 0.9 mg/dL (ref 0.44–1.00)
GFR calc Af Amer: >60 mL/min (ref >60)
GFR calc non Af Amer: >60 mL/min (ref >60)
Glucose, Bld: 97 mg/dL (ref 70–99)
Potassium: 4.2 mmol/L (ref 3.5–5.1)
Sodium: 138 mmol/L (ref 135–145)
Total Bilirubin: 0.6 mg/dL (ref 0.3–1.2)
Total Protein: 7.9 g/dL (ref 6.5–8.1)

## 2018-10-05 LAB — CBC WITH DIFFERENTIAL/PLATELET
Abs Immature Granulocytes: 0.02 10*3/uL (ref 0.00–0.07)
Basophils Absolute: 0.1 10*3/uL (ref 0.0–0.1)
Basophils Relative: 1 %
Eosinophils Absolute: 0.2 10*3/uL (ref 0.0–0.5)
Eosinophils Relative: 2 %
HCT: 45.9 % (ref 36.0–46.0)
Hemoglobin: 15.8 g/dL — ABNORMAL HIGH (ref 12.0–15.0)
Immature Granulocytes: 0 %
Lymphocytes Relative: 44 %
Lymphs Abs: 2.9 10*3/uL (ref 0.7–4.0)
MCH: 34.6 pg — ABNORMAL HIGH (ref 26.0–34.0)
MCHC: 34.4 g/dL (ref 30.0–36.0)
MCV: 100.4 fL — ABNORMAL HIGH (ref 80.0–100.0)
Monocytes Absolute: 0.4 10*3/uL (ref 0.1–1.0)
Monocytes Relative: 6 %
Neutro Abs: 3.1 10*3/uL (ref 1.7–7.7)
Neutrophils Relative %: 47 %
Platelets: 252 10*3/uL (ref 150–400)
RBC: 4.57 MIL/uL (ref 3.87–5.11)
RDW: 12 % (ref 11.5–15.5)
WBC: 6.6 10*3/uL (ref 4.0–10.5)
nRBC: 0 % (ref 0.0–0.2)

## 2018-10-05 LAB — LIPASE, BLOOD: Lipase: 59 U/L — ABNORMAL HIGH (ref 11–51)

## 2018-10-05 LAB — SARS CORONAVIRUS 2 BY RT PCR (HOSPITAL ORDER, PERFORMED IN ~~LOC~~ HOSPITAL LAB): SARS Coronavirus 2: NEGATIVE

## 2018-10-05 MED ORDER — HYDROCHLOROTHIAZIDE 25 MG PO TABS: 25.0000 mg | ORAL_TABLET | Freq: Every day | ORAL | 0 refills | Status: DC

## 2018-10-05 MED ORDER — HYDROCHLOROTHIAZIDE 12.5 MG PO CAPS
12.5000 mg | ORAL_CAPSULE | Freq: Once | ORAL | Status: AC
  Administered 2018-10-05: 12.5 mg via ORAL
  Filled 2018-10-05: qty 1

## 2018-10-05 NOTE — ED Provider Notes (Signed)
[place] DEPT Provider Note   CSN: [id number] Arrival date & time: 10/05/18  1648    History   Chief Complaint Chief Complaint  Patient presents with  . Dizziness  . Shaking  . Covid exposure    HPI Kara Tapia is a 78 y.o. female.     HPI Patient presents with 1 day of illness. She presents with a cough, abdominal discomfort, lightheadedness, unsteadiness. Onset was today, and prior to that she only complains of fatigue. Since onset symptoms have been persistent, worsening, with particularly unsettled sensation diffusely, without focal head pain, without disorientation, without difficulty speaking or swallowing. Patient is a smoker.  Notably, the patient also spent time at the beach 10 days ago with family members, 1 of whom tested positive for coronavirus.  Past Medical History:  Diagnosis Date  . Arthritis   . Hematuria   . History of palpitations   . Migraine   . Migraine with status migrainosus 08/09/2014   Ocular migraines.  . Night terrors, adult   . Osteoarthritis   . PVD (peripheral vascular disease) ([place])   . Reflux esophagitis     Patient Active Problem List   Diagnosis Date Noted  . Lisping 12/02/2017  . Sleep behavior disorder, REM 06/04/2017  . MCI (mild cognitive impairment) 06/04/2017  . Insomnia 07/06/2015  . Nightmares REM-sleep type 07/06/2015  . 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 Parasomnia, organic 10/04/2014  . Migraine with status migrainosus 08/09/2014  . 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 Intractable migraine with aura with status migrainosus 08/09/2014  . REM sleep behavior disorder 08/09/2014    History reviewed. No pertinent surgical history.   OB History   No obstetric history on file.      Home Medications    Prior to Admission medications   Medication Sig Start Date End Date Taking? Authorizing Provider  aspirin 75 MG chewable  tablet Chew 1 tablet (75 mg total) by mouth daily. Patient taking differently: Chew 75 mg by mouth as needed.  10/04/14   Dohmeier, Asencion Partridge, MD  clonazePAM (KLONOPIN) 1 MG tablet TAKE 1/2-1 TABLET AT BEDTIME IF NEEDED FOR REM BD. 07/13/18   Dohmeier, Asencion Partridge, MD  donepezil (ARICEPT) 5 MG tablet Take 1 tablet (5 mg total) by mouth every morning. 12/02/17   Dohmeier, Asencion Partridge, MD    Family History Family History  Problem Relation Age of Onset  . Diabetes Mother   . Heart disease Mother   . Cancer Mother   . Cancer Father   . Lung disease Sister     Social History Social History   Tobacco Use  . Smoking status: Current Some Day Smoker    Packs/day: 0.50    Types: Cigarettes  . Smokeless tobacco: Never Used  Substance Use Topics  . Alcohol use: Yes    Alcohol/week: 5.0 standard drinks    Types: 5 Glasses of wine per week  . Drug use: No     Allergies   Caine-1 [lidocaine]   Review of Systems Review of Systems  Constitutional:       Per HPI, otherwise negative  HENT:       Per HPI, otherwise negative  Respiratory:       Per HPI, otherwise negative  Cardiovascular:       Per HPI, otherwise negative  Gastrointestinal: Negative for vomiting.  Endocrine:       Negative aside from HPI  Genitourinary:  Neg aside from HPI   Musculoskeletal:       Per HPI, otherwise negative  Skin: Negative.   Neurological: Negative for syncope.     Physical Exam Updated Vital Signs BP (!) 213/105 (BP Location: Left Arm)   Pulse 65   Temp 98.3 F (36.8 C) (Oral)   Resp 18   SpO2 100%   Physical Exam Vitals signs and nursing note reviewed.  Constitutional:      General: She is not in acute distress.    Appearance: She is well-developed.  HENT:     Head: Normocephalic and atraumatic.  Eyes:     Conjunctiva/sclera: Conjunctivae normal.  Cardiovascular:     Rate and Rhythm: Normal rate and regular rhythm.  Pulmonary:     Effort: Pulmonary effort is normal. No respiratory  distress.     Breath sounds: Normal breath sounds. No stridor.  Abdominal:     General: There is no distension.  Skin:    General: Skin is warm and dry.  Neurological:     Mental Status: She is alert and oriented to person, place, and time.     Cranial Nerves: No cranial nerve deficit.      ED Treatments / Results  Labs (all labs ordered are listed, but only abnormal results are displayed) Labs Reviewed  SARS CORONAVIRUS 2 (HOSPITAL ORDER, [place] [facility])  COMPREHENSIVE METABOLIC PANEL  LIPASE, BLOOD  CBC WITH DIFFERENTIAL/PLATELET    EKG None  Radiology Dg Chest Port 1 View  Result Date: 10/05/2018 CLINICAL DATA:  Cough and prior COVID-19 exposure EXAM: PORTABLE CHEST 1 VIEW COMPARISON:  05/13/2011 FINDINGS: Cardiac shadows within normal limits. Aortic calcifications are stable. Lungs are well aerated bilaterally without focal infiltrate or sizable effusion. No bony abnormality is seen. IMPRESSION: No acute abnormality noted. Electronically Signed   By: Inez Catalina M.D.   On: 10/05/2018 20:37    Procedures Procedures (including critical care time)  Medications Ordered in ED Medications - No data to display   Initial Impression / Assessment and Plan / ED Course  I have reviewed the triage vital signs and the nursing notes.  Pertinent labs & imaging results that were available during my care of the patient were reviewed by me and considered in my medical decision making (see chart for details).        9:36 PM X-ray unremarkable. This generally well adult female, with a smoking history presents with cough, generalized discomfort, concern for possible COVID exposure to a family member in the past 10 days. Patient is awake and alert, but with her concerns, labs, x-ray performed. X-ray reassuring, labs pending, coronavirus test pending.  However, the patient now states that she has had polyuria, and blood pressure has been trending up since she  has been here. She is unaware of a history of hypertension, there is no lab evidence for endorgan damage. Patient's lipase is mildly elevated, but not substantially so, and she has no epigastric pain, nor any vomiting.  On signout, the patient is awaiting urinalysis results, provision of hydrochlorothiazide, if she remains generally well she may be appropriate for discharge, but repeat evaluation will be performed by physician assistant Marlon Pel.  Final Clinical Impressions(s) / ED Diagnoses   Final diagnoses:  Nausea  Polyuria  Hypertensive urgency     Carmin Muskrat, MD 10/05/18 2230

## 2018-10-05 NOTE — ED Triage Notes (Signed)
Pt c/o dizziness, shaky and eye pain that started today. When woke up this morning she felt constipated but has since had BM and feeling better. Reports that over week ago she was at the beach with family and one of her family members tested positive last Monday for Covid and patient has down syndrome child that she cares for and wants to be tested for Covid.

## 2018-10-05 NOTE — Discharge Instructions (Addendum)
As discussed, today's evaluation has been generally reassuring.  However, with concern of your blood pressure issues, you are starting any medication for this. It is available at your pharmacy. Please be sure to discuss today's presentation with your physician.  Do not hesitate to return here for concerning changes.  Please discuss the following results with your doctor:  Blood pressure Elevated Lipase (59) Small hematuria (blood in urine)  Due to the increased amount of urinating, and the blood in the urine, we will treat you with antibiotics and send your urine for culture.

## 2018-10-06 MED ORDER — CEPHALEXIN 500 MG PO CAPS: 500.0000 mg | ORAL_CAPSULE | Freq: Two times a day (BID) | ORAL | 0 refills | Status: DC

## 2018-10-06 NOTE — ED Provider Notes (Signed)
Patient signed out to me by Dr. Vanita Panda.  Patient with COVID exposure.  COVID test negative. Also with polyuria, and nausea, waiting on UA.  UA shows some hematuria.  Based on symptoms and abnormal UA, will send for culture and treat with keflex.  Recommend f/u with PCP.   Montine Circle, PA-C 10/06/18 0003    Carmin Muskrat, MD 10/06/18 1515

## 2018-10-07 DIAGNOSIS — N3001 Acute cystitis with hematuria: Secondary | ICD-10-CM | POA: Diagnosis not present

## 2018-10-07 DIAGNOSIS — R5383 Other fatigue: Secondary | ICD-10-CM | POA: Diagnosis not present

## 2018-10-07 DIAGNOSIS — R030 Elevated blood-pressure reading, without diagnosis of hypertension: Secondary | ICD-10-CM | POA: Diagnosis not present

## 2018-10-07 DIAGNOSIS — R110 Nausea: Secondary | ICD-10-CM | POA: Diagnosis not present

## 2018-10-12 DIAGNOSIS — N390 Urinary tract infection, site not specified: Secondary | ICD-10-CM | POA: Diagnosis not present

## 2018-10-12 DIAGNOSIS — R110 Nausea: Secondary | ICD-10-CM | POA: Diagnosis not present

## 2018-10-13 DIAGNOSIS — E871 Hypo-osmolality and hyponatremia: Secondary | ICD-10-CM | POA: Diagnosis not present

## 2018-10-13 DIAGNOSIS — R030 Elevated blood-pressure reading, without diagnosis of hypertension: Secondary | ICD-10-CM | POA: Diagnosis not present

## 2018-10-13 DIAGNOSIS — R110 Nausea: Secondary | ICD-10-CM | POA: Diagnosis not present

## 2018-10-13 DIAGNOSIS — R410 Disorientation, unspecified: Secondary | ICD-10-CM | POA: Diagnosis not present

## 2018-10-18 ENCOUNTER — Encounter (HOSPITAL_COMMUNITY): Payer: Self-pay

## 2018-10-18 ENCOUNTER — Emergency Department (HOSPITAL_COMMUNITY)
Admission: EM | Admit: 2018-10-18 | Discharge: 2018-10-18 | Disposition: A | Discharge status: Home / Self Care | Payer: Medicare Other | Attending: Emergency Medicine | Admitting: Emergency Medicine

## 2018-10-18 ENCOUNTER — Other Ambulatory Visit: Payer: Self-pay

## 2018-10-18 DIAGNOSIS — I10 Essential (primary) hypertension: Secondary | ICD-10-CM | POA: Diagnosis not present

## 2018-10-18 DIAGNOSIS — F17210 Nicotine dependence, cigarettes, uncomplicated: Secondary | ICD-10-CM | POA: Diagnosis not present

## 2018-10-18 DIAGNOSIS — Z79899 Other long term (current) drug therapy: Secondary | ICD-10-CM | POA: Insufficient documentation

## 2018-10-18 HISTORY — DX: Hypo-osmolality and hyponatremia: E87.1

## 2018-10-18 MED ORDER — AMLODIPINE BESYLATE 5 MG PO TABS
5.0000 mg | ORAL_TABLET | Freq: Once | ORAL | Status: AC
  Administered 2018-10-18: 5 mg via ORAL
  Filled 2018-10-18: qty 1

## 2018-10-18 MED ORDER — AMLODIPINE BESYLATE 5 MG PO TABS: 5.0000 mg | ORAL_TABLET | Freq: Every day | ORAL | 0 refills | Status: DC

## 2018-10-18 MED ORDER — AMLODIPINE BESYLATE 5 MG PO TABS: 10.0000 mg | ORAL_TABLET | Freq: Once | ORAL | Status: DC

## 2018-10-18 NOTE — ED Triage Notes (Signed)
Patient states she has ben drinking Pedialyte for hyponatremia. Patient reports that her physician told her to contact him if BP was >160/100. Patient  Recorded a BP 202/116 today and had various readings today. Patient called her physician and was told to come to the ED. Patient c/o dull headache and lightheadedness at times.

## 2018-10-19 DIAGNOSIS — E871 Hypo-osmolality and hyponatremia: Secondary | ICD-10-CM | POA: Diagnosis not present

## 2018-10-19 DIAGNOSIS — I10 Essential (primary) hypertension: Secondary | ICD-10-CM | POA: Diagnosis not present

## 2018-10-19 DIAGNOSIS — R410 Disorientation, unspecified: Secondary | ICD-10-CM | POA: Diagnosis not present

## 2018-10-19 DIAGNOSIS — R4781 Slurred speech: Secondary | ICD-10-CM | POA: Diagnosis not present

## 2018-10-19 DIAGNOSIS — R110 Nausea: Secondary | ICD-10-CM | POA: Diagnosis not present

## 2018-10-22 DIAGNOSIS — Z7189 Other specified counseling: Secondary | ICD-10-CM | POA: Diagnosis not present

## 2018-10-22 DIAGNOSIS — E871 Hypo-osmolality and hyponatremia: Secondary | ICD-10-CM | POA: Diagnosis not present

## 2018-10-22 DIAGNOSIS — I10 Essential (primary) hypertension: Secondary | ICD-10-CM | POA: Diagnosis not present

## 2018-10-22 DIAGNOSIS — R410 Disorientation, unspecified: Secondary | ICD-10-CM | POA: Diagnosis not present

## 2018-10-22 NOTE — ED Provider Notes (Signed)
[facility] DEPT Provider Note   CSN: [id number] Arrival date & time: 10/18/18  [place]     History   Chief Complaint No chief complaint on file.   HPI Kara Tapia is a 78 y.o. female.     HPI   78 year old female with hypertension.  She reports recently told that she was hyponatremic and has been drinking Pedialyte because of this.  She was taking hydrochlorothiazide but this was stopped.  She is noted multiple high blood pressure readings with the several consistently greater than 160/100.  Triage note reviewed but she denies any acute pain to me.  She specifically denies denies headaches, chest pain, visual changes, swelling, dyspnea, etc.  Past Medical History:  Diagnosis Date  . Arthritis   . Hematuria   . History of palpitations   . Hyponatremia   . Migraine   . Migraine with status migrainosus 08/09/2014   Ocular migraines.  . Night terrors, adult   . Osteoarthritis   . PVD (peripheral vascular disease) ([place])   . Reflux esophagitis     Patient Active Problem List   Diagnosis Date Noted  . Lisping 12/02/2017  . Sleep behavior disorder, REM 06/04/2017  . MCI (mild cognitive impairment) 06/04/2017  . Insomnia 07/06/2015  . Nightmares REM-sleep type 07/06/2015  . 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 Parasomnia, organic 10/04/2014  . Migraine with status migrainosus 08/09/2014  . 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 Intractable migraine with aura with status migrainosus 08/09/2014  . REM sleep behavior disorder 08/09/2014    History reviewed. No pertinent surgical history.   OB History   No obstetric history on file.      Home Medications    Prior to Admission medications   Medication Sig Start Date End Date Taking? Authorizing Provider  amLODipine (NORVASC) 5 MG tablet Take 1 tablet (5 mg total) by mouth daily. 10/18/18   Virgel Manifold, MD  aspirin 75 MG chewable  tablet Chew 1 tablet (75 mg total) by mouth daily. Patient not taking: Reported on 10/05/2018 10/04/14   Dohmeier, Asencion Partridge, MD  cephALEXin (KEFLEX) 500 MG capsule Take 1 capsule (500 mg total) by mouth 2 (two) times daily. 10/06/18   Montine Circle, PA-C  cholecalciferol (VITAMIN D3) 25 MCG (1000 UT) tablet Take 1,000 Units by mouth daily.    [provider]  clonazePAM (KLONOPIN) 1 MG tablet TAKE 1/2-1 TABLET AT BEDTIME IF NEEDED FOR REM BD. Patient taking differently: Take 0.5 mg by mouth at bedtime as needed (sleep).  07/13/18   Dohmeier, Asencion Partridge, MD  donepezil (ARICEPT) 5 MG tablet Take 1 tablet (5 mg total) by mouth every morning. 12/02/17   Dohmeier, Asencion Partridge, MD  hydrochlorothiazide (HYDRODIURIL) 25 MG tablet Take 1 tablet (25 mg total) by mouth daily. 10/05/18   Carmin Muskrat, MD    Family History Family History  Problem Relation Age of Onset  . Diabetes Mother   . Heart disease Mother   . Cancer Mother   . Cancer Father   . Lung disease Sister     Social History Social History   Tobacco Use  . Smoking status: Current Some Day Smoker    Packs/day: 0.50    Types: Cigarettes  . Smokeless tobacco: Never Used  Substance Use Topics  . Alcohol use: Yes    Alcohol/week: 5.0 standard drinks    Types: 5 Glasses of wine per week  . Drug use: No  Allergies   Caine-1 [lidocaine]   Review of Systems Review of Systems  All systems reviewed and negative, other than as noted in HPI.  Physical Exam Updated Vital Signs BP (!) 201/112   Pulse 67   Temp 98.2 F (36.8 C) (Oral)   Resp (!) 21   Ht 5\' 6"  (1.676 m)   Wt 58.1 kg   SpO2 99%   BMI 20.66 kg/m   Physical Exam Vitals signs and nursing note reviewed.  Constitutional:      General: She is not in acute distress.    Appearance: She is well-developed.  HENT:     Head: Normocephalic and atraumatic.  Eyes:     General:        Right eye: No discharge.        Left eye: No discharge.      Conjunctiva/sclera: Conjunctivae normal.  Neck:     Musculoskeletal: Neck supple.  Cardiovascular:     Rate and Rhythm: Normal rate and regular rhythm.     Heart sounds: Normal heart sounds. No murmur. No friction rub. No gallop.   Pulmonary:     Effort: Pulmonary effort is normal. No respiratory distress.     Breath sounds: Normal breath sounds.  Abdominal:     General: There is no distension.     Palpations: Abdomen is soft.     Tenderness: There is no abdominal tenderness.  Musculoskeletal:        General: No tenderness.  Skin:    General: Skin is warm and dry.  Neurological:     Mental Status: She is alert.  Psychiatric:        Behavior: Behavior normal.        Thought Content: Thought content normal.      ED Treatments / Results  Labs (all labs ordered are listed, but only abnormal results are displayed) Labs Reviewed - No data to display  EKG None  Radiology No results found.  Procedures Procedures (including critical care time)  Medications Ordered in ED Medications  amLODipine (NORVASC) tablet 5 mg (5 mg Oral Given 10/18/18 2220)     Initial Impression / Assessment and Plan / ED Course  I have reviewed the triage vital signs and the nursing notes.  Pertinent labs & imaging results that were available during my care of the patient were reviewed by me and considered in my medical decision making (see chart for details).        78 year old female with hypertension.  She was on hydrochlorothiazide but this was stopped, presumably because of hyponatremia.  We will start her on Norvasc at this time.  There is no emergent indication for acute blood pressure reduction in the emergency room.  She is advised to continue to keep a log of her blood pressure.  She needs to follow-up with her PCP and bring this log with her.  Will defer further medication management to her primary care provider.  Emergent return precautions were discussed.  Final Clinical  Impressions(s) / ED Diagnoses   Final diagnoses:  Essential hypertension    ED Discharge Orders         Ordered    amLODipine (NORVASC) 5 MG tablet  Daily     10/18/18 2211           Virgel Manifold, MD 10/22/18 [phone]

## 2018-11-02 DIAGNOSIS — R413 Other amnesia: Secondary | ICD-10-CM | POA: Diagnosis not present

## 2018-11-02 DIAGNOSIS — J449 Chronic obstructive pulmonary disease, unspecified: Secondary | ICD-10-CM | POA: Diagnosis not present

## 2018-11-02 DIAGNOSIS — W19XXXA Unspecified fall, initial encounter: Secondary | ICD-10-CM | POA: Diagnosis not present

## 2018-11-02 DIAGNOSIS — I10 Essential (primary) hypertension: Secondary | ICD-10-CM | POA: Diagnosis not present

## 2018-11-02 DIAGNOSIS — E871 Hypo-osmolality and hyponatremia: Secondary | ICD-10-CM | POA: Diagnosis not present

## 2018-11-02 DIAGNOSIS — R410 Disorientation, unspecified: Secondary | ICD-10-CM | POA: Diagnosis not present

## 2018-11-12 ENCOUNTER — Telehealth: Payer: Self-pay

## 2018-11-12 DIAGNOSIS — R55 Syncope and collapse: Secondary | ICD-10-CM | POA: Diagnosis not present

## 2018-11-12 DIAGNOSIS — R410 Disorientation, unspecified: Secondary | ICD-10-CM | POA: Diagnosis not present

## 2018-11-12 DIAGNOSIS — I10 Essential (primary) hypertension: Secondary | ICD-10-CM | POA: Diagnosis not present

## 2018-11-12 DIAGNOSIS — R51 Headache: Secondary | ICD-10-CM | POA: Diagnosis not present

## 2018-11-12 NOTE — Telephone Encounter (Signed)
Call placed to daughter per GT.  Appt made for 11/16/2018.  Advised of covid precautions.  Daughter indicates understanding.

## 2018-11-13 ENCOUNTER — Telehealth: Payer: Self-pay

## 2018-11-13 NOTE — Telephone Encounter (Signed)
Spoke with pt regarding appt on 11/16/18. Pt stated she has not been in contact with anyone who may have covid-19 and has no symptoms. 

## 2018-11-16 ENCOUNTER — Ambulatory Visit (INDEPENDENT_AMBULATORY_CARE_PROVIDER_SITE_OTHER): Payer: Medicare Other | Admitting: Internal Medicine

## 2018-11-16 ENCOUNTER — Encounter: Payer: Self-pay | Admitting: Internal Medicine

## 2018-11-16 ENCOUNTER — Other Ambulatory Visit: Payer: Self-pay

## 2018-11-16 VITALS — BP 102/68 | HR 80 | Ht 66.0 in | Wt 126.0 lb

## 2018-11-16 DIAGNOSIS — R55 Syncope and collapse: Secondary | ICD-10-CM

## 2018-11-16 NOTE — Progress Notes (Signed)
HPI Kara Tapia is self referred today for evaluation of syncope as well as labile blood pressures and fatigue. She presented with very high bp approx. 6 weeks ago. She was initially placed on HCTZ and then switched to amlodipine. She notes that her energy level is low. Her BP has been erratic with SBP ranging from 200 to 90. She had an episode of brief syncope for which she landed on the ground a couple of weeks ago. She has been found to have hematuria. I have not yet gotten her records from her primary MD. She still has had episodic weakness and jitteriness with no clear cut arrhythmia.  Allergies  Allergen Reactions  . Caine-1 [Lidocaine] Other (See Comments)    Affects her BP     Current Outpatient Medications  Medication Sig Dispense Refill  . amLODipine (NORVASC) 5 MG tablet Take 1 tablet (5 mg total) by mouth daily. (Patient taking differently: Take 5 mg by mouth daily. Take one tablet in the evening if needed) 30 tablet 0  . cholecalciferol (VITAMIN D3) 25 MCG (1000 UT) tablet Take 1,000 Units by mouth daily.    . clonazePAM (KLONOPIN) 0.25 MG disintegrating tablet Take 0.25 mg by mouth as needed for seizure.    . donepezil (ARICEPT) 5 MG tablet Take 1 tablet (5 mg total) by mouth every morning. 30 tablet 11   No current facility-administered medications for this visit.      Past Medical History:  Diagnosis Date  . 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 Arthritis   . Hematuria   . History of palpitations   . Hyponatremia   . Insomnia 07/06/2015  . Intractable migraine with aura with status migrainosus 08/09/2014  . Lisping 12/02/2017  . MCI (mild cognitive impairment) 06/04/2017  . Migraine   . Migraine with status migrainosus 08/09/2014   Ocular migraines.  . Night terrors, adult   . Nightmares REM-sleep type 07/06/2015  . 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 Osteoarthritis   . Parasomnia, organic 10/04/2014  . PVD (peripheral vascular  disease) ([place])   . Reflux esophagitis   . REM sleep behavior disorder 08/09/2014  . 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 Sleep behavior disorder, REM 06/04/2017    ROS:   All systems reviewed and negative except as noted in the HPI.   No past surgical history on file.   Family History  Problem Relation Age of Onset  . Diabetes Mother   . Heart disease Mother   . Cancer Mother   . Cancer Father   . Lung disease Sister      Social History   Socioeconomic History  . Marital status: Single    Spouse name: Not on file  . Number of children: 3  . Years of education: 38  . Highest education level: Not on file  Occupational History  . Not on file  Social Needs  . Financial resource strain: Not on file  . Food insecurity    Worry: Not on file    Inability: Not on file  . Transportation needs    Medical: Not on file    Non-medical: Not on file  Tobacco Use  . Smoking status: Current Some Day Smoker    Packs/day: 0.50    Types: Cigarettes  . Smokeless tobacco: Never Used  Substance and Sexual Activity  . Alcohol use: Yes    Alcohol/week: 5.0 standard drinks    Types: 5 Glasses of wine per week  .  Drug use: No  . Sexual activity: Not on file  Lifestyle  . Physical activity    Days per week: Not on file    Minutes per session: Not on file  . Stress: Not on file  Relationships  . Social Herbalist on phone: Not on file    Gets together: Not on file    Attends religious service: Not on file    Active member of club or organization: Not on file    Attends meetings of clubs or organizations: Not on file    Relationship status: Not on file  . Intimate partner violence    Fear of current or ex partner: Not on file    Emotionally abused: Not on file    Physically abused: Not on file    Forced sexual activity: Not on file  Other Topics Concern  . Not on file  Social History Narrative   Drinks 3-4 cups of caffeine daily.     BP 102/68   Pulse 80   Ht 5\' 6"  (1.676 m)    Wt 126 lb (57.2 kg)   SpO2 99%   BMI 20.34 kg/m   Physical Exam:  Well appearing 78 yo woman, NAD HEENT: Unremarkable Neck:  6 cm JVD, no thyromegally Lymphatics:  No adenopathy Back:  No CVA tenderness Lungs:  Clear with no wheezes HEART:  Regular rate rhythm, no murmurs, no rubs, no clicks Abd:  soft, positive bowel sounds, no organomegally, no rebound, no guarding Ext:  2 plus pulses, no edema, no cyanosis, no clubbing Skin:  No rashes no nodules Neuro:  CN II through XII intact, motor grossly intact  EKG - none.    Assess/Plan: 1. Syncope - she has had a single episode. It could be related to labile bp or an arrhythmia or something else. I have recommended a 2D echo and a 14 day zio patch. I will follow her up after.  2. HTN- her blood pressure is better today. It has been both high and low. She will continue the amlodipine. 3. Generalized fatigue and weakness - the etiology is unclear. She thinks that she might be a little better. She will undergo watchful waiting.  Mikle Bosworth.D.

## 2018-11-16 NOTE — Patient Instructions (Signed)
Medication Instructions:  Your physician recommends that you continue on your current medications as directed. Please refer to the Current Medication list given to you today.  If you need a refill on your cardiac medications before your next appointment, please call your pharmacy.   Lab work: None Ordered  If you have labs (blood work) drawn today and your tests are completely normal, you will receive your results only by: Marland Kitchen MyChart Message (if you have MyChart) OR . A paper copy in the mail If you have any lab test that is abnormal or we need to change your treatment, we will call you to review the results.  Testing/Procedures: Your physician has requested that you have an echocardiogram. Echocardiography is a painless test that uses sound waves to create images of your heart. It provides your doctor with information about the size and shape of your heart and how well your heart's chambers and valves are working. This procedure takes approximately one hour. There are no restrictions for this procedure.  Your physician has recommended that you wear an event monitor (Zio patch 14 day). Event monitors are medical devices that record the heart's electrical activity. Doctors most often [place] these monitors to diagnose arrhythmias. Arrhythmias are problems with the speed or rhythm of the heartbeat. The monitor is a small, portable device. You can wear one while you do your normal daily activities. This is usually used to diagnose what is causing palpitations/syncope (passing out).    Follow-Up: Your physician recommends that you schedule a follow-up appointment in: 6 weeks with Dr. Lovena Le

## 2018-11-17 ENCOUNTER — Telehealth: Payer: Self-pay | Admitting: *Deleted

## 2018-11-17 NOTE — Addendum Note (Signed)
Addended by: Willeen Cass A on: 11/17/2018 11:07 AM   Modules accepted: Orders

## 2018-11-17 NOTE — Telephone Encounter (Signed)
[employer] to ship a 14 day cardiac event monitor to the patients home.  Instructions reviewed briefly as they are included in the monitor kit. 

## 2018-11-18 ENCOUNTER — Telehealth: Payer: Self-pay | Admitting: Neurology

## 2018-11-18 ENCOUNTER — Ambulatory Visit (INDEPENDENT_AMBULATORY_CARE_PROVIDER_SITE_OTHER): Payer: Medicare Other | Admitting: Neurology

## 2018-11-18 ENCOUNTER — Encounter: Payer: Self-pay | Admitting: Neurology

## 2018-11-18 ENCOUNTER — Other Ambulatory Visit: Payer: Self-pay

## 2018-11-18 VITALS — BP 150/85 | HR 74 | Temp 97.8°F | Ht 66.0 in | Wt 124.0 lb

## 2018-11-18 DIAGNOSIS — R270 Ataxia, unspecified: Secondary | ICD-10-CM

## 2018-11-18 DIAGNOSIS — G44329 Chronic post-traumatic headache, not intractable: Secondary | ICD-10-CM | POA: Diagnosis not present

## 2018-11-18 DIAGNOSIS — E871 Hypo-osmolality and hyponatremia: Secondary | ICD-10-CM | POA: Diagnosis not present

## 2018-11-18 DIAGNOSIS — R413 Other amnesia: Secondary | ICD-10-CM | POA: Diagnosis not present

## 2018-11-18 DIAGNOSIS — R260 Ataxic gait: Secondary | ICD-10-CM

## 2018-11-18 MED ORDER — DONEPEZIL HCL 5 MG PO TABS: 5.0000 mg | ORAL_TABLET | Freq: Two times a day (BID) | ORAL | 5 refills | Status: DC

## 2018-11-18 MED ORDER — CLONAZEPAM 0.25 MG PO TBDP: 0.2500 mg | ORAL_TABLET | ORAL | 1 refills | Status: DC | PRN
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
## 2018-11-18 NOTE — Addendum Note (Signed)
Addended by: Larey Seat on: 11/18/2018 02:12 PM   Modules accepted: Orders

## 2018-11-18 NOTE — Telephone Encounter (Signed)
[employer] Josem Kaufmann: [id number] (exp. 11/18/18 to 05/16/19)  Dillon Bjork: [id number] (exp. 11/18/18 to 05/16/19) order sent to GI. They will reach out to the patient to schedule.

## 2018-11-18 NOTE — Patient Instructions (Signed)
unexplained sudden , paroxysmal HTN-  With hyponatremia.  Temporary putting aricept on hold did not help vertigo.  Patient feels weak and unsteady.  PT gait needed, fall prevention.  I increased Aricept to 5 mg bid.  I await the hyponatremia work up and like to follow alternating with Np.    Dizziness Dizziness is a common problem. It is a feeling of unsteadiness or light-headedness. You may feel like you are about to faint. Dizziness can lead to injury if you stumble or fall. Anyone can become dizzy, but dizziness is more common in older adults. This condition can be caused by a number of things, including medicines, dehydration, or illness. Follow these instructions at home: Eating and drinking  Drink enough fluid to keep your urine clear or pale yellow. This helps to keep you from becoming dehydrated. Try to drink more clear fluids, such as water.  Do not drink alcohol.  Limit your caffeine intake if told to do so by your health care provider. Check ingredients and nutrition facts to see if a food or beverage contains caffeine.  Limit your salt (sodium) intake if told to do so by your health care provider. Check ingredients and nutrition facts to see if a food or beverage contains sodium. Activity  Avoid making quick movements. ? Rise slowly from chairs and steady yourself until you feel okay. ? In the morning, first sit up on the side of the bed. When you feel okay, stand slowly while you hold onto something until you know that your balance is fine.  If you need to stand in one place for a long time, move your legs often. Tighten and relax the muscles in your legs while you are standing.  Do not drive or use heavy machinery if you feel dizzy.  Avoid bending down if you feel dizzy. Place items in your home so that they are easy for you to reach without leaning over. Lifestyle  Do not use any products that contain nicotine or tobacco, such as cigarettes and e-cigarettes. If you  need help quitting, ask your health care provider.  Try to reduce your stress level by using methods such as yoga or meditation. Talk with your health care provider if you need help to manage your stress. General instructions  Watch your dizziness for any changes.  Take over-the-counter and prescription medicines only as told by your health care provider. Talk with your health care provider if you think that your dizziness is caused by a medicine that you are taking.  Tell a friend or a family member that you are feeling dizzy. If he or she notices any changes in your behavior, have this person call your health care provider.  Keep all follow-up visits as told by your health care provider. This is important. Contact a health care provider if:  Your dizziness does not go away.  Your dizziness or light-headedness gets worse.  You feel nauseous.  You have reduced hearing.  You have new symptoms.  You are unsteady on your feet or you feel like the room is spinning. Get help right away if:  You vomit or have diarrhea and are unable to eat or drink anything.  You have problems talking, walking, swallowing, or using your arms, hands, or legs.  You feel generally weak.  You are not thinking clearly or you have trouble forming sentences. It may take a friend or family member to notice this.  You have chest pain, abdominal pain, shortness of breath, or  sweating.  Your vision changes.  You have any bleeding.  You have a severe headache.  You have neck pain or a stiff neck.  You have a fever. These symptoms may represent a serious problem that is an emergency. Do not wait to see if the symptoms will go away. Get medical help right away. Call your local emergency services (911 in the [place] Do not drive yourself to the hospital. Summary  Dizziness is a feeling of unsteadiness or light-headedness. This condition can be caused by a number of things, including medicines, dehydration,  or illness.  Anyone can become dizzy, but dizziness is more common in older adults.  Drink enough fluid to keep your urine clear or pale yellow. Do not drink alcohol.  Avoid making quick movements if you feel dizzy. Monitor your dizziness for any changes. This information is not intended to replace advice given to you by your health care provider. Make sure you discuss any questions you have with your health care provider. Document Released: 09/04/2000 Document Revised: 03/14/2017 Document Reviewed: 04/13/2016 Elsevier Patient Education  2020 [employer].

## 2018-11-19 ENCOUNTER — Telehealth: Payer: Self-pay | Admitting: *Deleted

## 2018-11-19 DIAGNOSIS — R232 Flushing: Secondary | ICD-10-CM | POA: Diagnosis not present

## 2018-11-19 DIAGNOSIS — I10 Essential (primary) hypertension: Secondary | ICD-10-CM | POA: Diagnosis not present

## 2018-11-19 DIAGNOSIS — R002 Palpitations: Secondary | ICD-10-CM | POA: Diagnosis not present

## 2018-11-19 DIAGNOSIS — I169 Hypertensive crisis, unspecified: Secondary | ICD-10-CM | POA: Diagnosis not present

## 2018-11-19 LAB — BASIC METABOLIC PANEL
BUN/Creatinine Ratio: 17 (ref 12–28)
BUN: 15 mg/dL (ref 8–27)
CO2: 22 mmol/L (ref 20–29)
Calcium: 9.8 mg/dL (ref 8.7–10.3)
Chloride: 101 mmol/L (ref 96–106)
Creatinine, Ser: 0.9 mg/dL (ref 0.57–1.00)
GFR calc Af Amer: 71 mL/min/{1.73_m2} (ref >59)
GFR calc non Af Amer: 62 mL/min/{1.73_m2} (ref >59)
Glucose: 83 mg/dL (ref 65–99)
Potassium: 5.3 mmol/L — ABNORMAL HIGH (ref 3.5–5.2)
Sodium: 139 mmol/L (ref 134–144)

## 2018-11-19 NOTE — Telephone Encounter (Signed)
Called and spoke w/ pt about results per Dr. Brett Fairy note. She verbalized understanding. I forwarded results to PCP.  She asked about update on MRI that was ordered. I checked status and advised it was approved via [place] and order sent to [facility]. She will call them at [phone] to schedule.

## 2018-11-19 NOTE — Telephone Encounter (Signed)
-----   Message from Larey Seat, MD sent at 11/19/2018  8:57 AM EDT ----- Please tell Kara Tapia that her Sodium is normal and Potassium was a little elevated. CD  PS:  Her Blood pressure was high yesterday again, and according to her this BP correlates with dizziness, Headaches and clumsiness.  Cc Dr Ashby Dawes

## 2018-11-19 NOTE — Telephone Encounter (Signed)
Received fax from [facility] stating insurance will not allow BID dosing. Asking to change Rx to 10 mg daily. Dr Brett Fairy stated she must take BID, wrote Rx for 1/2 of 10 mg tab taken bid. Faxed back to [facility].

## 2018-11-20 ENCOUNTER — Ambulatory Visit (INDEPENDENT_AMBULATORY_CARE_PROVIDER_SITE_OTHER): Payer: Medicare Other

## 2018-11-20 DIAGNOSIS — R55 Syncope and collapse: Secondary | ICD-10-CM | POA: Diagnosis not present

## 2018-11-23 ENCOUNTER — Telehealth: Payer: Self-pay

## 2018-11-23 NOTE — Progress Notes (Signed)
See telephone note from 11/23/2018.

## 2018-11-23 NOTE — Telephone Encounter (Signed)
-----   Message from Larey Seat, MD sent at 11/19/2018  8:57 AM EDT ----- Please tell [name] that her Sodium is normal and Potassium was a little elevated. CD  PS:  Her Blood pressure was high yesterday again, and according to her this BP correlates with dizziness, Headaches and clumsiness.  Cc Dr Ashby Dawes

## 2018-11-23 NOTE — Telephone Encounter (Signed)
I reached out to the pt and advised of results. She verbalized understanding. Pt states her dizziness/headaches are still present and on 11/02/2018 she fell in the bathroom and hit her head. Pt is scheduled MRI for 12/03/2018.

## 2018-11-24 ENCOUNTER — Ambulatory Visit (HOSPITAL_COMMUNITY): Payer: Medicare Other | Attending: Cardiovascular Disease

## 2018-11-24 ENCOUNTER — Other Ambulatory Visit: Payer: Self-pay

## 2018-11-24 DIAGNOSIS — R55 Syncope and collapse: Secondary | ICD-10-CM

## 2018-12-03 ENCOUNTER — Other Ambulatory Visit: Payer: Self-pay

## 2018-12-03 ENCOUNTER — Ambulatory Visit
Admission: RE | Admit: 2018-12-03 | Discharge: 2018-12-03 | Disposition: A | Discharge status: Home / Self Care | Payer: Medicare Other | Source: Ambulatory Visit | Attending: Neurology | Admitting: Neurology

## 2018-12-03 DIAGNOSIS — G44329 Chronic post-traumatic headache, not intractable: Secondary | ICD-10-CM

## 2018-12-03 MED ORDER — GADOBENATE DIMEGLUMINE 529 MG/ML IV SOLN
11.0000 mL | Freq: Once | INTRAVENOUS | Status: AC | PRN
  Administered 2018-12-03: 11 mL via INTRAVENOUS

## 2018-12-08 ENCOUNTER — Ambulatory Visit: Payer: Medicare Other | Admitting: Neurology

## 2018-12-08 DIAGNOSIS — Z7189 Other specified counseling: Secondary | ICD-10-CM | POA: Diagnosis not present

## 2018-12-08 DIAGNOSIS — Z23 Encounter for immunization: Secondary | ICD-10-CM | POA: Diagnosis not present

## 2018-12-08 DIAGNOSIS — R42 Dizziness and giddiness: Secondary | ICD-10-CM | POA: Diagnosis not present

## 2018-12-08 DIAGNOSIS — I10 Essential (primary) hypertension: Secondary | ICD-10-CM | POA: Diagnosis not present

## 2018-12-10 ENCOUNTER — Ambulatory Visit: Payer: Medicare Other | Admitting: Cardiology

## 2018-12-10 DIAGNOSIS — H52223 Regular astigmatism, bilateral: Secondary | ICD-10-CM | POA: Diagnosis not present

## 2018-12-15 ENCOUNTER — Telehealth: Payer: Self-pay | Admitting: Neurology

## 2018-12-15 ENCOUNTER — Other Ambulatory Visit: Payer: Self-pay | Admitting: Neurology

## 2018-12-15 ENCOUNTER — Telehealth: Payer: Self-pay

## 2018-12-15 NOTE — Telephone Encounter (Signed)
I have not received MRI results on the patient at this time to review with her. I will make Dr Dohmeier aware the patient has called so she may look for these and as soon as I get the results I will call the pt

## 2018-12-15 NOTE — Telephone Encounter (Signed)
The study was performed on 12-03-2018 and compared to an MRI of the brain dated 10/08/2007. In the interim, there has been progression in the number and extent of white matter foci.   IMPRESSION: This is an abnormal MRI of the brain with and without contrast showing scattered white matter changes in the deep, subcortical and periventricular white matter. The pattern is most consistent with chronic age-related microvascular ischemic changes.   No significant change compared with previous MRI from 08/26/2014.  INTERPRETING PHYSICIAN: Leonie Man, MD   There is a result note from august in the system - this is what she looks for.  Result Notes for MR BRAIN W WO CONTRAST  Notes recorded by Larey Seat, MD on 12/05/2018 at 12:20 PM EDT  Scattered white matter changes in the deep, subcortical  and periventricular white matter. The pattern is most consistent with  chronic age-related microvascular ischemic changes.  No significant  change compared with previous MRI from 08/26/2014.   No evidence of significant atrophy , no strokes,. No tumor.

## 2018-12-15 NOTE — Telephone Encounter (Signed)
Where was the MRI done? I don't see a result either. CD

## 2018-12-15 NOTE — Telephone Encounter (Signed)
Called the pt reviewed the MRI results and informed her of the findings. Patient made aware and verbalized understanding. Pt had no questions at this time but was encouraged to call back if questions arise.

## 2018-12-15 NOTE — Telephone Encounter (Signed)
Patient wanting to know the results of her MRI. Please advise.

## 2018-12-15 NOTE — Progress Notes (Signed)
Result Notes for MR BRAIN W WO CONTRAST  Notes recorded by Larey Seat, MD on 12/05/2018 at 12:20 PM EDT  Scattered white matter changes in the deep, subcortical  and periventricular white matter. The pattern is most consistent with  chronic age-related microvascular ischemic changes.  No significant  change compared with previous MRI from 08/26/2014.   No evidence of significant atrophy , no strokes,. No tumor.

## 2018-12-24 ENCOUNTER — Other Ambulatory Visit: Payer: Self-pay

## 2018-12-30 DIAGNOSIS — L570 Actinic keratosis: Secondary | ICD-10-CM | POA: Diagnosis not present

## 2018-12-30 DIAGNOSIS — D225 Melanocytic nevi of trunk: Secondary | ICD-10-CM | POA: Diagnosis not present

## 2018-12-30 DIAGNOSIS — Z85828 Personal history of other malignant neoplasm of skin: Secondary | ICD-10-CM | POA: Diagnosis not present

## 2018-12-30 DIAGNOSIS — D485 Neoplasm of uncertain behavior of skin: Secondary | ICD-10-CM | POA: Diagnosis not present

## 2019-01-12 ENCOUNTER — Other Ambulatory Visit: Payer: Self-pay

## 2019-01-12 ENCOUNTER — Encounter: Payer: Self-pay | Admitting: Internal Medicine

## 2019-01-12 ENCOUNTER — Ambulatory Visit (INDEPENDENT_AMBULATORY_CARE_PROVIDER_SITE_OTHER): Payer: Medicare Other | Admitting: Internal Medicine

## 2019-01-12 DIAGNOSIS — R55 Syncope and collapse: Secondary | ICD-10-CM

## 2019-01-12 DIAGNOSIS — I10 Essential (primary) hypertension: Secondary | ICD-10-CM

## 2019-01-12 NOTE — Patient Instructions (Addendum)

## 2019-01-12 NOTE — Progress Notes (Signed)
HPI Ms. Kara Tapia returns today for followup. She is a pleasant 77 yo woman with HTN. She has a h/o syncope and HTN urgency remotely. She was seen by me several weeks ago and had a 2D echo and a cardiac monitor which demonstrated normal LV function with no significant valve abnormalities and no arrhythmias on her cardiac monitor. She feels better. No angina symptoms. Allergies  Allergen Reactions  . Caine-1 [Lidocaine] Other (See Comments)    Affects her BP     Current Outpatient Medications  Medication Sig Dispense Refill  . amLODipine (NORVASC) 5 MG tablet Take 1 tablet (5 mg total) by mouth daily. (Patient taking differently: Take 5 mg by mouth daily. Take one tablet in the evening if needed) 30 tablet 0  . cholecalciferol (VITAMIN D3) 25 MCG (1000 UT) tablet Take 1,000 Units by mouth daily.    . clonazePAM (KLONOPIN) 0.25 MG disintegrating tablet Take 1 tablet (0.25 mg total) by mouth as needed for seizure. 60 tablet 1  . donepezil (ARICEPT) 5 MG tablet Take 1 tablet (5 mg total) by mouth 2 (two) times daily. 60 tablet 5   No current facility-administered medications for this visit.      Past Medical History:  Diagnosis Date  . 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 Arthritis   . Hematuria   . History of palpitations   . Hyponatremia   . Insomnia 07/06/2015  . Intractable migraine with aura with status migrainosus 08/09/2014  . Lisping 12/02/2017  . MCI (mild cognitive impairment) 06/04/2017  . Migraine   . Migraine with status migrainosus 08/09/2014   Ocular migraines.  . Night terrors, adult   . Nightmares REM-sleep type 07/06/2015  . 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 Osteoarthritis   . Parasomnia, organic 10/04/2014  . PVD (peripheral vascular disease) ([place])   . Reflux esophagitis   . REM sleep behavior disorder 08/09/2014  . 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 Sleep behavior disorder, REM 06/04/2017    ROS:   All systems reviewed and negative except  as noted in the HPI.   No past surgical history on file.   Family History  Problem Relation Age of Onset  . Diabetes Mother   . Heart disease Mother   . Cancer Mother   . Cancer Father   . Lung disease Sister      Social History   Socioeconomic History  . Marital status: Single    Spouse name: Not on file  . Number of children: 3  . Years of education: 56  . Highest education level: Not on file  Occupational History  . Not on file  Social Needs  . Financial resource strain: Not on file  . Food insecurity    Worry: Not on file    Inability: Not on file  . Transportation needs    Medical: Not on file    Non-medical: Not on file  Tobacco Use  . Smoking status: Current Some Day Smoker    Packs/day: 0.50    Types: Cigarettes  . Smokeless tobacco: Never Used  Substance and Sexual Activity  . Alcohol use: Yes    Alcohol/week: 5.0 standard drinks    Types: 5 Glasses of wine per week  . Drug use: No  . Sexual activity: Not on file  Lifestyle  . Physical activity    Days per week: Not on file    Minutes per session: Not on file  . Stress: Not on file  Relationships  . Social Herbalist on phone: Not on file    Gets together: Not on file    Attends religious service: Not on file    Active member of club or organization: Not on file    Attends meetings of clubs or organizations: Not on file    Relationship status: Not on file  . Intimate partner violence    Fear of current or ex partner: Not on file    Emotionally abused: Not on file    Physically abused: Not on file    Forced sexual activity: Not on file  Other Topics Concern  . Not on file  Social History Narrative   Drinks 3-4 cups of caffeine daily.     BP 126/78   Pulse 82   Ht 5\' 6"  (1.676 m)   Wt 129 lb (58.5 kg)   SpO2 92%   BMI 20.82 kg/m   Physical Exam:  Well appearing NAD HEENT: Unremarkable Neck:  No JVD, no thyromegally Lymphatics:  No adenopathy Back:  No CVA tenderness  Lungs:  Clear with no wheezes HEART:  Regular rate rhythm, no murmurs, no rubs, no clicks Abd:  soft, positive bowel sounds, no organomegally, no rebound, no guarding Ext:  2 plus pulses, no edema, no cyanosis, no clubbing Skin:  No rashes no nodules Neuro:  CN II through XII intact, motor grossly intact  EKG - nsr   Assess/Plan: 1. Syncope - no obvious cardiac abnormality. She will undergo watchful waiting. If she had more syncope, then I would suggest an ILR. 2. HTN - her blood pressure has been well controlled. I discussed the importance of a low sodium diet. If her bp is up I told her to take an extra amlodipine.   Mikle Bosworth.D.

## 2019-02-22 ENCOUNTER — Encounter: Payer: Self-pay | Admitting: Family Medicine

## 2019-02-22 ENCOUNTER — Ambulatory Visit (INDEPENDENT_AMBULATORY_CARE_PROVIDER_SITE_OTHER): Payer: Medicare Other | Admitting: Family Medicine

## 2019-02-22 ENCOUNTER — Other Ambulatory Visit: Payer: Self-pay

## 2019-02-22 VITALS — BP 122/74 | HR 81 | Temp 97.2°F | Ht 66.0 in | Wt 132.2 lb

## 2019-02-22 DIAGNOSIS — G3184 Mild cognitive impairment, so stated: Secondary | ICD-10-CM | POA: Diagnosis not present

## 2019-02-22 DIAGNOSIS — G4752 REM sleep behavior disorder: Secondary | ICD-10-CM

## 2019-02-22 DIAGNOSIS — R413 Other amnesia: Secondary | ICD-10-CM

## 2019-02-22 NOTE — Patient Instructions (Signed)
We will continue Aricept 5mg  twice daily as directed  Continue clonazepam 0.25mg  at bedtime ( I will discuss dose change with Dr Brett Fairy)  Follow up in 6 months, sooner if you need [place].    Memory Compensation Strategies  1. Use "WARM" strategy.  W= write it down  A= associate it  R= repeat it  M= make a mental note  2.   You can keep a Social worker.  Use a 3-ring notebook with sections for the following: calendar, important names and phone numbers,  medications, doctors' names/phone numbers, lists/reminders, and a section to journal what you did  each day.   3.    Use a calendar to write appointments down.  4.    Write yourself a schedule for the day.  This can be placed on the calendar or in a separate section of the Memory Notebook.  Keeping a  regular schedule can help memory.  5.    Use medication organizer with sections for each day or morning/evening pills.  You may need help loading it  6.    Keep a basket, or pegboard by the door.  Place items that you need to take out with you in the basket or on the pegboard.  You may also want to  include a message board for reminders.  7.    Use sticky notes.  Place sticky notes with reminders in a place where the task is performed.  For example: " turn off the  stove" placed by the stove, "lock the door" placed on the door at eye level, " take your medications" on  the bathroom mirror or by the place where you normally take your medications.  8.    Use alarms/timers.  Use while cooking to remind yourself to check on food or as a reminder to take your medicine, or as a  reminder to make a call, or as a reminder to perform another task, etc.
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
## 2019-02-24 ENCOUNTER — Encounter: Payer: Self-pay | Admitting: Family Medicine

## 2019-04-13 DIAGNOSIS — I10 Essential (primary) hypertension: Secondary | ICD-10-CM | POA: Diagnosis not present

## 2019-04-13 DIAGNOSIS — M65819 Other synovitis and tenosynovitis, unspecified shoulder: Secondary | ICD-10-CM | POA: Diagnosis not present

## 2019-04-13 DIAGNOSIS — Z8639 Personal history of other endocrine, nutritional and metabolic disease: Secondary | ICD-10-CM | POA: Diagnosis not present

## 2019-04-13 DIAGNOSIS — G3184 Mild cognitive impairment, so stated: Secondary | ICD-10-CM | POA: Diagnosis not present

## 2019-04-29 DIAGNOSIS — M25511 Pain in right shoulder: Secondary | ICD-10-CM | POA: Diagnosis not present

## 2019-05-04 DIAGNOSIS — M25511 Pain in right shoulder: Secondary | ICD-10-CM | POA: Diagnosis not present

## 2019-05-12 DIAGNOSIS — M25511 Pain in right shoulder: Secondary | ICD-10-CM | POA: Diagnosis not present

## 2019-05-14 DIAGNOSIS — M25511 Pain in right shoulder: Secondary | ICD-10-CM | POA: Diagnosis not present

## 2019-05-18 DIAGNOSIS — M25511 Pain in right shoulder: Secondary | ICD-10-CM | POA: Diagnosis not present

## 2019-05-25 DIAGNOSIS — M25511 Pain in right shoulder: Secondary | ICD-10-CM | POA: Diagnosis not present

## 2019-06-08 DIAGNOSIS — I10 Essential (primary) hypertension: Secondary | ICD-10-CM | POA: Diagnosis not present

## 2019-06-08 DIAGNOSIS — Z0000 Encounter for general adult medical examination without abnormal findings: Secondary | ICD-10-CM | POA: Diagnosis not present

## 2019-06-08 DIAGNOSIS — R7989 Other specified abnormal findings of blood chemistry: Secondary | ICD-10-CM | POA: Diagnosis not present

## 2019-06-15 DIAGNOSIS — R82998 Other abnormal findings in urine: Secondary | ICD-10-CM | POA: Diagnosis not present

## 2019-06-15 DIAGNOSIS — M859 Disorder of bone density and structure, unspecified: Secondary | ICD-10-CM | POA: Diagnosis not present

## 2019-06-15 DIAGNOSIS — I10 Essential (primary) hypertension: Secondary | ICD-10-CM | POA: Diagnosis not present

## 2019-06-15 DIAGNOSIS — G3184 Mild cognitive impairment, so stated: Secondary | ICD-10-CM | POA: Diagnosis not present

## 2019-06-15 DIAGNOSIS — Z0000 Encounter for general adult medical examination without abnormal findings: Secondary | ICD-10-CM | POA: Diagnosis not present

## 2019-06-17 ENCOUNTER — Other Ambulatory Visit: Payer: Self-pay | Admitting: Internal Medicine

## 2019-06-23 ENCOUNTER — Other Ambulatory Visit: Payer: Self-pay | Admitting: Internal Medicine

## 2019-06-24 ENCOUNTER — Other Ambulatory Visit: Payer: Self-pay | Admitting: Internal Medicine

## 2019-06-24 DIAGNOSIS — Z1231 Encounter for screening mammogram for malignant neoplasm of breast: Secondary | ICD-10-CM

## 2019-06-24 DIAGNOSIS — M8580 Other specified disorders of bone density and structure, unspecified site: Secondary | ICD-10-CM

## 2019-06-28 ENCOUNTER — Other Ambulatory Visit: Payer: Self-pay | Admitting: Neurology

## 2019-07-20 ENCOUNTER — Other Ambulatory Visit: Payer: Self-pay

## 2019-07-20 ENCOUNTER — Ambulatory Visit (INDEPENDENT_AMBULATORY_CARE_PROVIDER_SITE_OTHER): Payer: Medicare Other | Admitting: Internal Medicine

## 2019-07-20 ENCOUNTER — Encounter: Payer: Self-pay | Admitting: Internal Medicine

## 2019-07-20 VITALS — BP 128/80 | HR 74 | Ht 66.0 in | Wt 130.4 lb

## 2019-07-20 DIAGNOSIS — R55 Syncope and collapse: Secondary | ICD-10-CM | POA: Diagnosis not present

## 2019-07-20 DIAGNOSIS — I10 Essential (primary) hypertension: Secondary | ICD-10-CM

## 2019-07-20 NOTE — Patient Instructions (Signed)

## 2019-07-20 NOTE — Progress Notes (Signed)
HPI Kara Tapia returns today for followup. She is a pleasant 79 yo woman with a h/o HTN, and syncope who has done well in the interim. She denies chest pain or sob. She has not had syncope and no palpitations. No edema. She is active in her yard but notes that she cannot work quite as long as she could when she was younger. Allergies  Allergen Reactions  . Caine-1 [Lidocaine] Other (See Comments)    Affects her BP     Current Outpatient Medications  Medication Sig Dispense Refill  . amLODipine (NORVASC) 5 MG tablet Take 1 tablet (5 mg total) by mouth daily. 30 tablet 0  . clonazePAM (KLONOPIN) 0.25 MG disintegrating tablet Take 1 tablet (0.25 mg total) by mouth at bedtime. 90 tablet 0  . donepezil (ARICEPT) 5 MG tablet Take 1 tablet (5 mg total) by mouth 2 (two) times daily. 60 tablet 5   No current facility-administered medications for this visit.     Past Medical History:  Diagnosis Date  . 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 Arthritis   . Hematuria   . History of palpitations   . Hyponatremia   . Insomnia 07/06/2015  . Intractable migraine with aura with status migrainosus 08/09/2014  . Lisping 12/02/2017  . MCI (mild cognitive impairment) 06/04/2017  . Migraine   . Migraine with status migrainosus 08/09/2014   Ocular migraines.  . Night terrors, adult   . Nightmares REM-sleep type 07/06/2015  . 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 Osteoarthritis   . Parasomnia, organic 10/04/2014  . PVD (peripheral vascular disease) ([place])   . Reflux esophagitis   . REM sleep behavior disorder 08/09/2014  . 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 Sleep behavior disorder, REM 06/04/2017    ROS:   All systems reviewed and negative except as noted in the HPI.   History reviewed. No pertinent surgical history.   Family History  Problem Relation Age of Onset  . Diabetes Mother   . Heart disease Mother   . Cancer Mother   . Cancer Father   . Lung disease Sister       Social History   Socioeconomic History  . Marital status: Single    Spouse name: Not on file  . Number of children: 3  . Years of education: 3  . Highest education level: Not on file  Occupational History  . Not on file  Tobacco Use  . Smoking status: Current Some Day Smoker    Packs/day: 0.50    Types: Cigarettes  . Smokeless tobacco: Never Used  Substance and Sexual Activity  . Alcohol use: Yes    Alcohol/week: 5.0 standard drinks    Types: 5 Glasses of wine per week  . Drug use: No  . Sexual activity: Not on file  Other Topics Concern  . Not on file  Social History Narrative   Drinks 3-4 cups of caffeine daily.   Social Determinants of Health   Financial Resource Strain:   . Difficulty of Paying Living Expenses:   Food Insecurity:   . Worried About Charity fundraiser in the Last Year:   . Arboriculturist in the Last Year:   Transportation Needs:   . Film/video editor (Medical):   Marland Kitchen Lack of Transportation (Non-Medical):   Physical Activity:   . Days of Exercise per Week:   . Minutes of Exercise per Session:   Stress:   . Feeling of Stress :  Social Connections:   . Frequency of Communication with Friends and Family:   . Frequency of Social Gatherings with Friends and Family:   . Attends Religious Services:   . Active Member of Clubs or Organizations:   . Attends Archivist Meetings:   Marland Kitchen Marital Status:   Intimate Partner Violence:   . Fear of Current or Ex-Partner:   . Emotionally Abused:   Marland Kitchen Physically Abused:   . Sexually Abused:      BP 128/80   Pulse 74   Ht 5\' 6"  (1.676 m)   Wt 130 lb 6.4 oz (59.1 kg)   SpO2 98%   BMI 21.05 kg/m   Physical Exam:  Well appearing NAD HEENT: Unremarkable Neck:  No JVD, no thyromegally Lymphatics:  No adenopathy Back:  No CVA tenderness Lungs:  Clear with no wheezes HEART:  Regular rate rhythm, no murmurs, no rubs, no clicks Abd:  soft, positive bowel sounds, no organomegally, no  rebound, no guarding Ext:  2 plus pulses, no edema, no cyanosis, no clubbing Skin:  No rashes no nodules Neuro:  CN II through XII intact, motor grossly intact  EKG - NSR  Assess/Plan: 1. Syncope - she has not had any additional episodes. She will undergo watchful waiting. Her ECG is reassuring. 2. HTN - I have reviewed her log. She has only had a few episodes of SBP over 150. I encouraged her to take an extra amlodipine.  Mikle Bosworth.D.

## 2019-08-17 ENCOUNTER — Ambulatory Visit: Payer: Medicare Other | Admitting: Family Medicine

## 2019-09-23 ENCOUNTER — Other Ambulatory Visit: Payer: Self-pay

## 2019-09-23 ENCOUNTER — Ambulatory Visit
Admission: RE | Admit: 2019-09-23 | Discharge: 2019-09-23 | Disposition: A | Discharge status: Home / Self Care | Payer: Medicare Other | Source: Ambulatory Visit | Attending: Internal Medicine | Admitting: Internal Medicine

## 2019-09-23 DIAGNOSIS — Z1231 Encounter for screening mammogram for malignant neoplasm of breast: Secondary | ICD-10-CM | POA: Diagnosis not present

## 2019-09-28 ENCOUNTER — Encounter: Payer: Self-pay | Admitting: Family Medicine

## 2019-09-28 ENCOUNTER — Ambulatory Visit (INDEPENDENT_AMBULATORY_CARE_PROVIDER_SITE_OTHER): Payer: Medicare Other | Admitting: Family Medicine

## 2019-09-28 VITALS — BP 136/78 | HR 79 | Ht 66.0 in | Wt 130.5 lb

## 2019-09-28 DIAGNOSIS — G4752 REM sleep behavior disorder: Secondary | ICD-10-CM | POA: Diagnosis not present

## 2019-09-28 DIAGNOSIS — G3184 Mild cognitive impairment, so stated: Secondary | ICD-10-CM | POA: Diagnosis not present
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
## 2019-09-28 NOTE — Patient Instructions (Addendum)
We will continue clonazepam 0.25mg  daily at bedtime. We will also continue Aricept 5mg  twice daily.   Follow up in 6 months, sooner if needed     Memory Compensation Strategies  1. Use "WARM" strategy.  W= write it down  A= associate it  R= repeat it  M= make a mental note  2.   You can keep a Social worker.  Use a 3-ring notebook with sections for the following: calendar, important names and phone numbers,  medications, doctors' names/phone numbers, lists/reminders, and a section to journal what you did  each day.   3.    Use a calendar to write appointments down.  4.    Write yourself a schedule for the day.  This can be placed on the calendar or in a separate section of the Memory Notebook.  Keeping a  regular schedule can help memory.  5.    Use medication organizer with sections for each day or morning/evening pills.  You may need help loading it  6.    Keep a basket, or pegboard by the door.  Place items that you need to take out with you in the basket or on the pegboard.  You may also want to  include a message board for reminders.  7.    Use sticky notes.  Place sticky notes with reminders in a place where the task is performed.  For example: " turn off the  stove" placed by the stove, "lock the door" placed on the door at eye level, " take your medications" on  the bathroom mirror or by the place where you normally take your medications.  8.    Use alarms/timers.  Use while cooking to remind yourself to check on food or as a reminder to take your medicine, or as a  reminder to make a call, or as a reminder to perform another task, etc.   Clonazepam tablets What is this medicine? CLONAZEPAM (kloe NA ze pam) is a benzodiazepine. It is used to treat certain types of seizures. It is also used to treat panic disorder. This medicine may be used for other purposes; ask your health care provider or pharmacist if you have questions. COMMON BRAND NAME(S): Ceberclon,  Klonopin What should I tell my health care provider before I take this medicine? They need to know if you have any of these conditions:  an alcohol or drug abuse problem  bipolar disorder, depression, psychosis or other mental health condition  glaucoma  kidney or liver disease  lung or breathing disease  myasthenia gravis  Parkinson's disease  porphyria  seizures or a history of seizures  suicidal thoughts  an unusual or allergic reaction to clonazepam, other benzodiazepines, foods, dyes, or preservatives  pregnant or trying to get pregnant  breast-feeding How should I use this medicine? Take this medicine by mouth with a glass of water. Follow the directions on the prescription label. If it upsets your stomach, take it with food or milk. Take your medicine at regular intervals. Do not take it more often than directed. Do not stop taking or change the dose except on the advice of your doctor or health care professional. A special MedGuide will be given to you by the pharmacist with each prescription and refill. Be sure to read this information carefully each time. Talk to your pediatrician regarding the use of this medicine in children. Special care may be needed. Overdosage: If you think you have taken too much of this medicine contact  a poison control center or emergency room at once. NOTE: This medicine is only for you. Do not share this medicine with others. What if I miss a dose? If you miss a dose, take it as soon as you can. If it is almost time for your next dose, take only that dose. Do not take double or extra doses. What may interact with this medicine? Do not take this medication with any of the following medicines:  narcotic medicines for cough  sodium oxybate This medicine may also interact with the following medications:  alcohol  antihistamines for allergy, cough and cold  antiviral medicines for HIV or AIDS  certain medicines for anxiety or  sleep  certain medicines for depression, like amitriptyline, fluoxetine, sertraline  certain medicines for fungal infections like ketoconazole and itraconazole  certain medicines for seizures like carbamazepine, phenobarbital, phenytoin, primidone  general anesthetics like halothane, isoflurane, methoxyflurane, propofol  local anesthetics like lidocaine, pramoxine, tetracaine  medicines that relax muscles for surgery  narcotic medicines for pain  phenothiazines like chlorpromazine, mesoridazine, prochlorperazine, thioridazine This list may not describe all possible interactions. Give your health care provider a list of all the medicines, herbs, non-prescription drugs, or dietary supplements you use. Also tell them if you smoke, drink alcohol, or use illegal drugs. Some items may interact with your medicine. What should I watch for while using this medicine? Tell your doctor or health care professional if your symptoms do not start to get better or if they get worse. Do not stop taking except on your doctor's advice. You may develop a severe reaction. Your doctor will tell you how much medicine to take. You may get drowsy or dizzy. Do not drive, use machinery, or do anything that needs mental alertness until you know how this medicine affects you. To reduce the risk of dizzy and fainting spells, do not stand or sit up quickly, especially if you are an older patient. Alcohol may increase dizziness and drowsiness. Avoid alcoholic drinks. If you are taking another medicine that also causes drowsiness, you may have more side effects. Give your health care provider a list of all medicines you use. Your doctor will tell you how much medicine to take. Do not take more medicine than directed. Call emergency for help if you have problems breathing or unusual sleepiness. The use of this medicine may increase the chance of suicidal thoughts or actions. Pay special attention to how you are responding while  on this medicine. Any worsening of mood, or thoughts of suicide or dying should be reported to your health care professional right away. What side effects may I notice from receiving this medicine? Side effects that you should report to your doctor or health care professional as soon as possible:  allergic reactions like skin rash, itching or hives, swelling of the face, lips, or tongue  breathing problems  confusion  loss of balance or coordination  signs and symptoms of low blood pressure like dizziness; feeling faint or lightheaded, falls; unusually weak or tired  suicidal thoughts or mood changes Side effects that usually do not require medical attention (report to your doctor or health care professional if they continue or are bothersome):  dizziness  headache  tiredness  upset stomach This list may not describe all possible side effects. Call your doctor for medical advice about side effects. You may report side effects to FDA at 1-800-FDA-1088. Where should I keep my medicine? Keep out of the reach of children. This medicine can be  abused. Keep your medicine in a safe place to protect it from theft. Do not share this medicine with anyone. Selling or giving away this medicine is dangerous and against the law. This medicine may cause accidental overdose and death if taken by other adults, children, or pets. Mix any unused medicine with a substance like cat litter or coffee grounds. Then throw the medicine away in a sealed container like a sealed bag or a coffee can with a lid. Do not use the medicine after the expiration date. Store at room temperature between 15 and 30 degrees C (59 and 86 degrees F). Protect from light. Keep container tightly closed. NOTE: This sheet is a summary. It may not cover all possible information. If you have questions about this medicine, talk to your doctor, pharmacist, or health care provider.  2020 Elsevier/Gold Standard (2015-08-18 18:46:32)

## 2019-10-04 ENCOUNTER — Telehealth: Payer: Self-pay | Admitting: Family Medicine

## 2019-10-04 NOTE — Telephone Encounter (Signed)
Phone rep checked office voicemail's, at 2:33 pt left a message for a handicap placard to be completed for her

## 2019-10-05 NOTE — Telephone Encounter (Signed)
Called pt back and LVM letting her know we received request. AL,NP and Dr. Brett Fairy are out this week. We will address this next week for her when AL,NP returns.

## 2019-10-05 NOTE — Telephone Encounter (Signed)
Form placed in AL/NP desk if needed.

## 2019-10-07 DIAGNOSIS — H25813 Combined forms of age-related cataract, bilateral: Secondary | ICD-10-CM | POA: Diagnosis not present

## 2019-10-07 DIAGNOSIS — H35371 Puckering of macula, right eye: Secondary | ICD-10-CM | POA: Diagnosis not present

## 2019-10-07 DIAGNOSIS — H43813 Vitreous degeneration, bilateral: Secondary | ICD-10-CM | POA: Diagnosis not present

## 2019-10-11 NOTE — Telephone Encounter (Signed)
I called and spoke to pt.  She is actually calling for her daughter Park Pope.  I will make note in her chart.

## 2019-10-11 NOTE — Telephone Encounter (Signed)
Have we done this for her in the past? I have reviewed notes and do not see an approved reason for handicap placard. I will be happy to consider if I have missed something.

## 2019-10-13 ENCOUNTER — Other Ambulatory Visit: Payer: Self-pay | Admitting: Neurology

## 2019-10-30 ENCOUNTER — Other Ambulatory Visit: Payer: Self-pay | Admitting: Neurology

## 2019-12-07 DIAGNOSIS — H21562 Pupillary abnormality, left eye: Secondary | ICD-10-CM | POA: Diagnosis not present

## 2019-12-07 DIAGNOSIS — H268 Other specified cataract: Secondary | ICD-10-CM | POA: Diagnosis not present

## 2019-12-07 DIAGNOSIS — H25812 Combined forms of age-related cataract, left eye: Secondary | ICD-10-CM | POA: Diagnosis not present

## 2020-01-04 DIAGNOSIS — L821 Other seborrheic keratosis: Secondary | ICD-10-CM | POA: Diagnosis not present

## 2020-01-04 DIAGNOSIS — L814 Other melanin hyperpigmentation: Secondary | ICD-10-CM | POA: Diagnosis not present

## 2020-01-04 DIAGNOSIS — D225 Melanocytic nevi of trunk: Secondary | ICD-10-CM | POA: Diagnosis not present

## 2020-01-04 DIAGNOSIS — D485 Neoplasm of uncertain behavior of skin: Secondary | ICD-10-CM | POA: Diagnosis not present

## 2020-01-04 DIAGNOSIS — C44612 Basal cell carcinoma of skin of right upper limb, including shoulder: Secondary | ICD-10-CM | POA: Diagnosis not present

## 2020-01-04 DIAGNOSIS — Z85828 Personal history of other malignant neoplasm of skin: Secondary | ICD-10-CM | POA: Diagnosis not present

## 2020-01-26 DIAGNOSIS — C44612 Basal cell carcinoma of skin of right upper limb, including shoulder: Secondary | ICD-10-CM | POA: Diagnosis not present

## 2020-02-01 DIAGNOSIS — H21561 Pupillary abnormality, right eye: Secondary | ICD-10-CM | POA: Diagnosis not present

## 2020-02-01 DIAGNOSIS — H25811 Combined forms of age-related cataract, right eye: Secondary | ICD-10-CM | POA: Diagnosis not present

## 2020-02-01 DIAGNOSIS — H268 Other specified cataract: Secondary | ICD-10-CM | POA: Diagnosis not present

## 2020-02-09 DIAGNOSIS — H0014 Chalazion left upper eyelid: Secondary | ICD-10-CM | POA: Diagnosis not present

## 2020-02-24 ENCOUNTER — Other Ambulatory Visit: Payer: Self-pay | Admitting: Neurology

## 2020-03-08 DIAGNOSIS — H0014 Chalazion left upper eyelid: Secondary | ICD-10-CM | POA: Diagnosis not present

## 2020-03-08 DIAGNOSIS — H00014 Hordeolum externum left upper eyelid: Secondary | ICD-10-CM | POA: Diagnosis not present

## 2020-03-23 DIAGNOSIS — Z961 Presence of intraocular lens: Secondary | ICD-10-CM | POA: Diagnosis not present

## 2020-03-29 DIAGNOSIS — H00014 Hordeolum externum left upper eyelid: Secondary | ICD-10-CM | POA: Diagnosis not present

## 2020-03-29 NOTE — Patient Instructions (Signed)
Below is our plan:  We will continue clonazepam 0.25mg  at bedtime as needed and Aricept 10mg  daily.   Please make sure you are staying well hydrated. I recommend 50-60 ounces daily. Well balanced diet and regular exercise encouraged.    Please continue follow up with care team as directed.   Follow up in 1 year   You may receive a survey regarding today's visit. I encourage you to leave honest feed back as I do use this information to improve patient care. Thank you for seeing me today!     Memory Compensation Strategies  1. Use "WARM" strategy.  W= write it down  A= associate it  R= repeat it  M= make a mental note  2.   You can keep a Social worker.  Use a 3-ring notebook with sections for the following: calendar, important names and phone numbers,  medications, doctors' names/phone numbers, lists/reminders, and a section to journal what you did  each day.   3.    Use a calendar to write appointments down.  4.    Write yourself a schedule for the day.  This can be placed on the calendar or in a separate section of the Memory Notebook.  Keeping a  regular schedule can help memory.  5.    Use medication organizer with sections for each day or morning/evening pills.  You may need help loading it  6.    Keep a basket, or pegboard by the door.  Place items that you need to take out with you in the basket or on the pegboard.  You may also want to  include a message board for reminders.  7.    Use sticky notes.  Place sticky notes with reminders in a place where the task is performed.  For example: " turn off the  stove" placed by the stove, "lock the door" placed on the door at eye level, " take your medications" on  the bathroom mirror or by the place where you normally take your medications.  8.    Use alarms/timers.  Use while cooking to remind yourself to check on food or as a reminder to take your medicine, or as a  reminder to make a call, or as a reminder to perform  another task, etc.   Quality Sleep Information, Adult Quality sleep is important for your mental and physical health. It also improves your quality of life. Quality sleep means you:  Are asleep for most of the time you are in bed.  Fall asleep within 30 minutes.  Wake up no more than once a night.  Are awake for no longer than 20 minutes if you do wake up during the night. Most adults need 7-8 hours of quality sleep each night. How can poor sleep affect me? If you do not get enough quality sleep, you may have:  Mood swings.  Daytime sleepiness.  Confusion.  Decreased reaction time.  Sleep disorders, such as insomnia and sleep apnea.  Difficulty with: ? Solving problems. ? Coping with stress. ? Paying attention. These issues may affect your performance and productivity at work, school, and at home. Lack of sleep may also put you at higher risk for accidents, suicide, and risky behaviors. If you do not get quality sleep you may also be at higher risk for several health problems, including:  Infections.  Type 2 diabetes.  Heart disease.  High blood pressure.  Obesity.  Worsening of long-term conditions, like arthritis, kidney disease, depression,  Parkinson's disease, and epilepsy. What actions can I take to get more quality sleep?      Stick to a sleep schedule. Go to sleep and wake up at about the same time each day. Do not try to sleep less on weekdays and make up for lost sleep on weekends. This does not work.  Try to get about 30 minutes of exercise on most days. Do not exercise 2-3 hours before going to bed.  Limit naps during the day to 30 minutes or less.  Do not use any products that contain nicotine or tobacco, such as cigarettes or e-cigarettes. If you need help quitting, ask your health care provider.  Do not drink caffeinated beverages for at least 8 hours before going to bed. Coffee, tea, and some sodas contain caffeine.  Do not drink alcohol  close to bedtime.  Do not eat large meals close to bedtime.  Do not take naps in the late afternoon.  Try to get at least 30 minutes of sunlight every day. Morning sunlight is best.  Make time to relax before bed. Reading, listening to music, or taking a hot bath promotes quality sleep.  Make your bedroom a place that promotes quality sleep. Keep your bedroom dark, quiet, and at a comfortable room temperature. Make sure your bed is comfortable. Take out sleep distractions like TV, a computer, smartphone, and bright lights.  If you are lying awake in bed for longer than 20 minutes, get up and do a relaxing activity until you feel sleepy.  Work with your health care provider to treat medical conditions that may affect sleeping, such as: ? Nasal obstruction. ? Snoring. ? Sleep apnea and other sleep disorders.  Talk to your health care provider if you think any of your prescription medicines may cause you to have difficulty falling or staying asleep.  If you have sleep problems, talk with a sleep consultant. If you think you have a sleep disorder, talk with your health care provider about getting evaluated by a specialist. Where to find more information  National Sleep Foundation website: https://sleepfoundation.org  National Heart, Lung, and Blood Institute (NHLBI): https://hall.info/.pdf  Centers for Disease Control and Prevention (CDC): [employer]ail[employer].is Contact a health care provider if you:  Have trouble getting to sleep or staying asleep.  Often wake up very early in the morning and cannot get back to sleep.  Have daytime sleepiness.  Have daytime sleep attacks of suddenly falling asleep and sudden muscle weakness (narcolepsy).  Have a tingling sensation in your legs with a strong urge to move your legs (restless legs syndrome).  Stop breathing briefly during sleep (sleep apnea).  Think you have a sleep disorder or are  taking a medicine that is affecting your quality of sleep. Summary  Most adults need 7-8 hours of quality sleep each night.  Getting enough quality sleep is an important part of health and well-being.  Make your bedroom a place that promotes quality sleep and avoid things that may cause you to have poor sleep, such as alcohol, caffeine, smoking, and large meals.  Talk to your health care provider if you have trouble falling asleep or staying asleep. This information is not intended to replace advice given to you by your health care provider. Make sure you discuss any questions you have with your health care provider. Document Revised: 06/18/2017 Document Reviewed: 06/18/2017 Elsevier Patient Education  2020 [employer].
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
## 2020-03-30 ENCOUNTER — Ambulatory Visit: Payer: Medicare Other | Admitting: Family Medicine

## 2020-03-30 ENCOUNTER — Encounter: Payer: Self-pay | Admitting: Family Medicine

## 2020-03-30 VITALS — BP 122/82 | HR 72 | Ht 66.0 in | Wt 131.0 lb

## 2020-03-30 DIAGNOSIS — G4752 REM sleep behavior disorder: Secondary | ICD-10-CM | POA: Diagnosis not present

## 2020-03-30 DIAGNOSIS — G3184 Mild cognitive impairment, so stated: Secondary | ICD-10-CM | POA: Diagnosis not present

## 2020-03-30 MED ORDER — DONEPEZIL HCL 10 MG PO TABS: 10.0000 mg | ORAL_TABLET | Freq: Every day | ORAL | 3 refills | Status: DC

## 2020-03-30 MED ORDER — CLONAZEPAM 0.25 MG PO TBDP: ORAL_TABLET | ORAL | 5 refills | Status: DC

## 2020-06-13 DIAGNOSIS — M859 Disorder of bone density and structure, unspecified: Secondary | ICD-10-CM | POA: Diagnosis not present

## 2020-06-13 DIAGNOSIS — I10 Essential (primary) hypertension: Secondary | ICD-10-CM | POA: Diagnosis not present

## 2020-07-06 ENCOUNTER — Other Ambulatory Visit: Payer: Self-pay | Admitting: Internal Medicine

## 2020-07-06 DIAGNOSIS — M8580 Other specified disorders of bone density and structure, unspecified site: Secondary | ICD-10-CM

## 2020-07-26 DIAGNOSIS — K1329 Other disturbances of oral epithelium, including tongue: Secondary | ICD-10-CM | POA: Diagnosis not present

## 2020-07-28 ENCOUNTER — Other Ambulatory Visit: Payer: Self-pay

## 2020-07-28 ENCOUNTER — Other Ambulatory Visit: Payer: Self-pay | Admitting: Internal Medicine

## 2020-07-28 ENCOUNTER — Ambulatory Visit: Payer: Medicare Other | Admitting: Internal Medicine

## 2020-07-28 ENCOUNTER — Encounter: Payer: Self-pay | Admitting: Internal Medicine

## 2020-07-28 VITALS — BP 112/68 | HR 77 | Ht 66.0 in | Wt 136.2 lb

## 2020-07-28 DIAGNOSIS — Z1231 Encounter for screening mammogram for malignant neoplasm of breast: Secondary | ICD-10-CM

## 2020-07-28 DIAGNOSIS — R55 Syncope and collapse: Secondary | ICD-10-CM | POA: Diagnosis not present

## 2020-07-28 DIAGNOSIS — I10 Essential (primary) hypertension: Secondary | ICD-10-CM | POA: Diagnosis not present

## 2020-07-28 MED ORDER — ROSUVASTATIN CALCIUM 5 MG PO TABS: ORAL_TABLET | ORAL | 6 refills | Status: DC

## 2020-07-28 NOTE — Progress Notes (Signed)
HPI Kara Tapia returns today for followup. She is a pleasant 80 yo woman with a h/o HTN, and syncope who has done well in the interim. She denies chest pain or sob. She has not had syncope and no palpitations. No edema. She is active in her yard but notes that she cannot work quite as long as she could when she was younger. Allergies  Allergen Reactions  . Caine-1 [Lidocaine] Other (See Comments)    Affects her BP     Current Outpatient Medications  Medication Sig Dispense Refill  . amLODipine (NORVASC) 5 MG tablet Take 1 tablet (5 mg total) by mouth daily. 30 tablet 0  . clonazePAM (KLONOPIN) 0.25 MG disintegrating tablet DISSOLVE 1 TABLET UNDER THE TONGUE DAILY AT BEDTIME. 30 tablet 5  . donepezil (ARICEPT) 10 MG tablet Take 1 tablet (10 mg total) by mouth at bedtime. 90 tablet 3  . rosuvastatin (CRESTOR) 5 MG tablet Take one tablet by mouth three times a week. 36 tablet 6   No current facility-administered medications for this visit.     Past Medical History:  Diagnosis Date  . 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 Arthritis   . Hematuria   . History of palpitations   . Hyponatremia   . Insomnia 07/06/2015  . Intractable migraine with aura with status migrainosus 08/09/2014  . Lisping 12/02/2017  . MCI (mild cognitive impairment) 06/04/2017  . Migraine   . Migraine with status migrainosus 08/09/2014   Ocular migraines.  . Night terrors, adult   . Nightmares REM-sleep type 07/06/2015  . 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 Osteoarthritis   . Parasomnia, organic 10/04/2014  . PVD (peripheral vascular disease) ([place])   . Reflux esophagitis   . REM sleep behavior disorder 08/09/2014  . 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 Sleep behavior disorder, REM 06/04/2017    ROS:   All systems reviewed and negative except as noted in the HPI.   History reviewed. No pertinent surgical history.   Family History  Problem Relation Age of Onset  . Diabetes Mother    . Heart disease Mother   . Cancer Mother   . Cancer Father   . Lung disease Sister      Social History   Socioeconomic History  . Marital status: Single    Spouse name: Not on file  . Number of children: 3  . Years of education: 60  . Highest education level: Not on file  Occupational History  . Not on file  Tobacco Use  . Smoking status: Current Some Day Smoker    Packs/day: 0.50    Types: Cigarettes  . Smokeless tobacco: Never Used  Vaping Use  . Vaping Use: Never used  Substance and Sexual Activity  . Alcohol use: Yes    Alcohol/week: 5.0 standard drinks    Types: 5 Glasses of wine per week  . Drug use: No  . Sexual activity: Not on file  Other Topics Concern  . Not on file  Social History Narrative   Drinks 3-4 cups of caffeine daily.   Social Determinants of Health   Financial Resource Strain: Not on file  Food Insecurity: Not on file  Transportation Needs: Not on file  Physical Activity: Not on file  Stress: Not on file  Social Connections: Not on file  Intimate Partner Violence: Not on file     BP 112/68   Pulse 77   Ht 5\' 6"  (1.676 m)  Wt 136 lb 3.2 oz (61.8 kg)   SpO2 95%   BMI 21.98 kg/m   Physical Exam:  Well appearing NAD HEENT: Unremarkable Neck:  No JVD, no thyromegally Lymphatics:  No adenopathy Back:  No CVA tenderness Lungs:  Clear HEART:  Regular rate rhythm, no murmurs, no rubs, no clicks Abd:  soft, positive bowel sounds, no organomegally, no rebound, no guarding Ext:  2 plus pulses, no edema, no cyanosis, no clubbing Skin:  No rashes no nodules Neuro:  CN II through XII intact, motor grossly intact  EKG -   DEVICE  Normal device function.  See PaceArt for details.   Assess/Plan: 1. Syncope - she has not had any additional episodes. She will undergo watchful waiting. Her ECG is reassuring.  2. HTN - her bp is well controlled. We will follow. 3. Dyslipidemia - I have reviewed her fasting lipids. Her LDL and  triglycerides are elevated. She has asked me about taking supplements and I have recommended taking low dose crestor.  4. Chest pain - this appears to be quiet.  Fia Hebert,MD  Mikle Bosworth.D.

## 2020-07-28 NOTE — Patient Instructions (Addendum)
Medication Instructions:  Your physician has recommended you make the following change in your medication:   1.  Start taking Crestor (rosuvastatin) 5 mg-  Take one tablet by mouth 3 times a week  Labwork: None ordered.  Testing/Procedures: None ordered.  Follow-Up: Your physician wants you to follow-up in: 2 years with Cristopher Peru, MD or one of the following Advanced Practice Providers on your designated Care Team:    Chanetta Marshall, NP  Tommye Standard, PA-C  Legrand Como "Jonni Sanger" Chalmers Cater, [place]   Any Other Special Instructions Will Be Listed Below (If Applicable).  If you need a refill on your cardiac medications before your next appointment, please call your pharmacy.

## 2020-08-18 DIAGNOSIS — K1321 Leukoplakia of oral mucosa, including tongue: Secondary | ICD-10-CM | POA: Diagnosis not present

## 2020-08-18 DIAGNOSIS — K136 Irritative hyperplasia of oral mucosa: Secondary | ICD-10-CM | POA: Diagnosis not present

## 2020-10-10 ENCOUNTER — Other Ambulatory Visit: Payer: Self-pay

## 2020-10-10 ENCOUNTER — Ambulatory Visit (INDEPENDENT_AMBULATORY_CARE_PROVIDER_SITE_OTHER): Payer: Medicare Other | Admitting: Otolaryngology

## 2020-10-10 ENCOUNTER — Encounter (INDEPENDENT_AMBULATORY_CARE_PROVIDER_SITE_OTHER): Payer: Self-pay | Admitting: Otolaryngology

## 2020-10-10 VITALS — Temp 98.4°F

## 2020-10-10 DIAGNOSIS — J310 Chronic rhinitis: Secondary | ICD-10-CM | POA: Diagnosis not present

## 2020-10-10 DIAGNOSIS — J342 Deviated nasal septum: Secondary | ICD-10-CM

## 2020-10-10 MED ORDER — AZELASTINE HCL 0.1 % NA SOLN: 1.0000 | Freq: Two times a day (BID) | NASAL | Status: DC

## 2020-10-10 MED ORDER — TRIAMCINOLONE ACETONIDE 55 MCG/ACT NA AERO: 2.0000 | INHALATION_SPRAY | Freq: Every day | NASAL | 12 refills | Status: DC

## 2020-10-10 NOTE — Progress Notes (Signed)
HPI: Kara Tapia is a 80 y.o. female who presents is referred by Dr. Carollee Herter for evaluation of nasal congestion, allergies and runny nose.  She is seen her medical doctor and treated with Claritin and diagnosed with allergies.  She was having a biopsy performed of her tongue by Dr. Carollee Herter for a lesion noted by her dentist.  Apparently she was sneezing a lot and had a runny nose he recommended follow-up here.  She also states that she snores.  She has not seen an allergist previously.  She does have a cat..  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)    Reflux esophagitis    REM sleep behavior disorder 08/09/2014   [place] 08/09/2014   Sleep behavior disorder, REM 06/04/2017   No past surgical history on file. Social History   Socioeconomic History   Marital status: Single    Spouse name: Not on file   Number of children: 3   Years of education: 16   Highest education level: Not on file  Occupational History   Not on file  Tobacco Use   Smoking status: Some Days    Packs/day: 0.50    Types: Cigarettes   Smokeless tobacco: Never  Vaping Use   Vaping Use: Never used  Substance and Sexual Activity   Alcohol use: Yes    Alcohol/week: 5.0 standard drinks    Types: 5 Glasses of wine per week   Drug use: No   Sexual activity: Not on file  Other Topics Concern   Not on file  Social History Narrative   Drinks 3-4 cups of caffeine daily.    Social Determinants of Health   Financial Resource Strain: Not on file  Food Insecurity: Not on file  Transportation Needs: Not on file  Physical Activity: Not on file  Stress: Not on file  Social Connections: Not on file   Family History  Problem Relation Age of Onset   Diabetes Mother    Heart disease Mother    Cancer Mother    Cancer Father    Lung disease Sister    Allergies  Allergen Reactions   Caine-1 [Lidocaine] Other (See Comments)    Affects her BP   Prior to Admission medications   Medication Sig Start Date End Date Taking? Authorizing Provider  amLODipine (NORVASC) 5 MG tablet Take 1 tablet (5 mg total) by mouth daily. 10/18/18   Virgel Manifold, MD  clonazePAM (KLONOPIN) 0.25 MG disintegrating tablet DISSOLVE 1 TABLET UNDER THE TONGUE DAILY AT BEDTIME. 03/30/20   Lomax, Amy, NP  donepezil (ARICEPT) 10 MG tablet Take 1 tablet (10 mg total) by mouth at bedtime. 03/30/20   Lomax, Amy, NP  rosuvastatin (CRESTOR) 5 MG tablet Take one tablet by mouth three times a week. 07/28/20   Evans Lance, MD     Positive ROS: Otherwise negative  All other systems have been reviewed and were otherwise  negative with the exception of those mentioned in the HPI and as above.  Physical Exam: Constitutional: Alert, well-appearing, no acute distress Ears: External ears without lesions or tenderness. Ear canals are clear bilaterally with intact, clear TMs.  Nasal: External nose without lesions. Septum is deviated to the left.  After decongesting the nose both middle meatus regions were clear with no evidence of active infection.  No polyps noted.  Moderate mucosal swelling including mucus discharge.  She describes clear drainage from her nose on a chronic basis. Oral: Lips and gums without lesions. Tongue and palate mucosa without lesions. Posterior oropharynx clear..  Tonsils are small and benign appearance bilaterally. Neck: No palpable adenopathy or masses Respiratory: Breathing  comfortably  Skin: No facial/neck lesions or rash noted.  Procedures  Assessment: Allergic rhinitis with septal deviation  Plan: Recommended use of antihistamine Claritin Allegra or Zyrtec and also prescribed Nasacort 2 sprays each nostril at night and azelastine 1 spray twice daily. I reviewed with her concerning use of saline rinses which she has used in the past will help clear some of the mucus from her nose on a as needed basis. Suggested perhaps seeing an allergist to get tested to see if she has any specific allergies especially to her cat.  Recommended allergy and asthma next-door.   Radene Journey, MD   CC:

## 2020-11-13 ENCOUNTER — Other Ambulatory Visit: Payer: Self-pay

## 2020-11-13 MED ORDER — CLONAZEPAM 0.25 MG PO TBDP: ORAL_TABLET | ORAL | 5 refills | Status: DC

## 2020-12-21 DIAGNOSIS — I700 Atherosclerosis of aorta: Secondary | ICD-10-CM | POA: Diagnosis not present

## 2020-12-21 DIAGNOSIS — Z720 Tobacco use: Secondary | ICD-10-CM | POA: Diagnosis not present

## 2020-12-21 DIAGNOSIS — I10 Essential (primary) hypertension: Secondary | ICD-10-CM | POA: Diagnosis not present

## 2020-12-21 DIAGNOSIS — E785 Hyperlipidemia, unspecified: Secondary | ICD-10-CM | POA: Diagnosis not present

## 2021-01-03 ENCOUNTER — Ambulatory Visit
Admission: RE | Admit: 2021-01-03 | Discharge: 2021-01-03 | Disposition: A | Discharge status: Home / Self Care | Payer: Medicare Other | Source: Ambulatory Visit | Attending: Internal Medicine | Admitting: Internal Medicine

## 2021-01-03 ENCOUNTER — Other Ambulatory Visit: Payer: Self-pay

## 2021-01-03 DIAGNOSIS — Z1231 Encounter for screening mammogram for malignant neoplasm of breast: Secondary | ICD-10-CM

## 2021-01-16 ENCOUNTER — Ambulatory Visit: Payer: Medicare Other

## 2021-01-16 ENCOUNTER — Other Ambulatory Visit: Payer: Medicare Other

## 2021-01-30 DIAGNOSIS — Z0100 Encounter for examination of eyes and vision without abnormal findings: Secondary | ICD-10-CM | POA: Diagnosis not present

## 2021-02-23 DIAGNOSIS — H524 Presbyopia: Secondary | ICD-10-CM | POA: Diagnosis not present

## 2021-03-15 IMAGING — DX PORTABLE CHEST - 1 VIEW
1 series · 1 of 1 positions shown · non-contrast
Comparison: 05/13/2011

CLINICAL DATA: Cough and prior [id number] exposure

EXAM:
PORTABLE CHEST 1 VIEW

[chest ap]
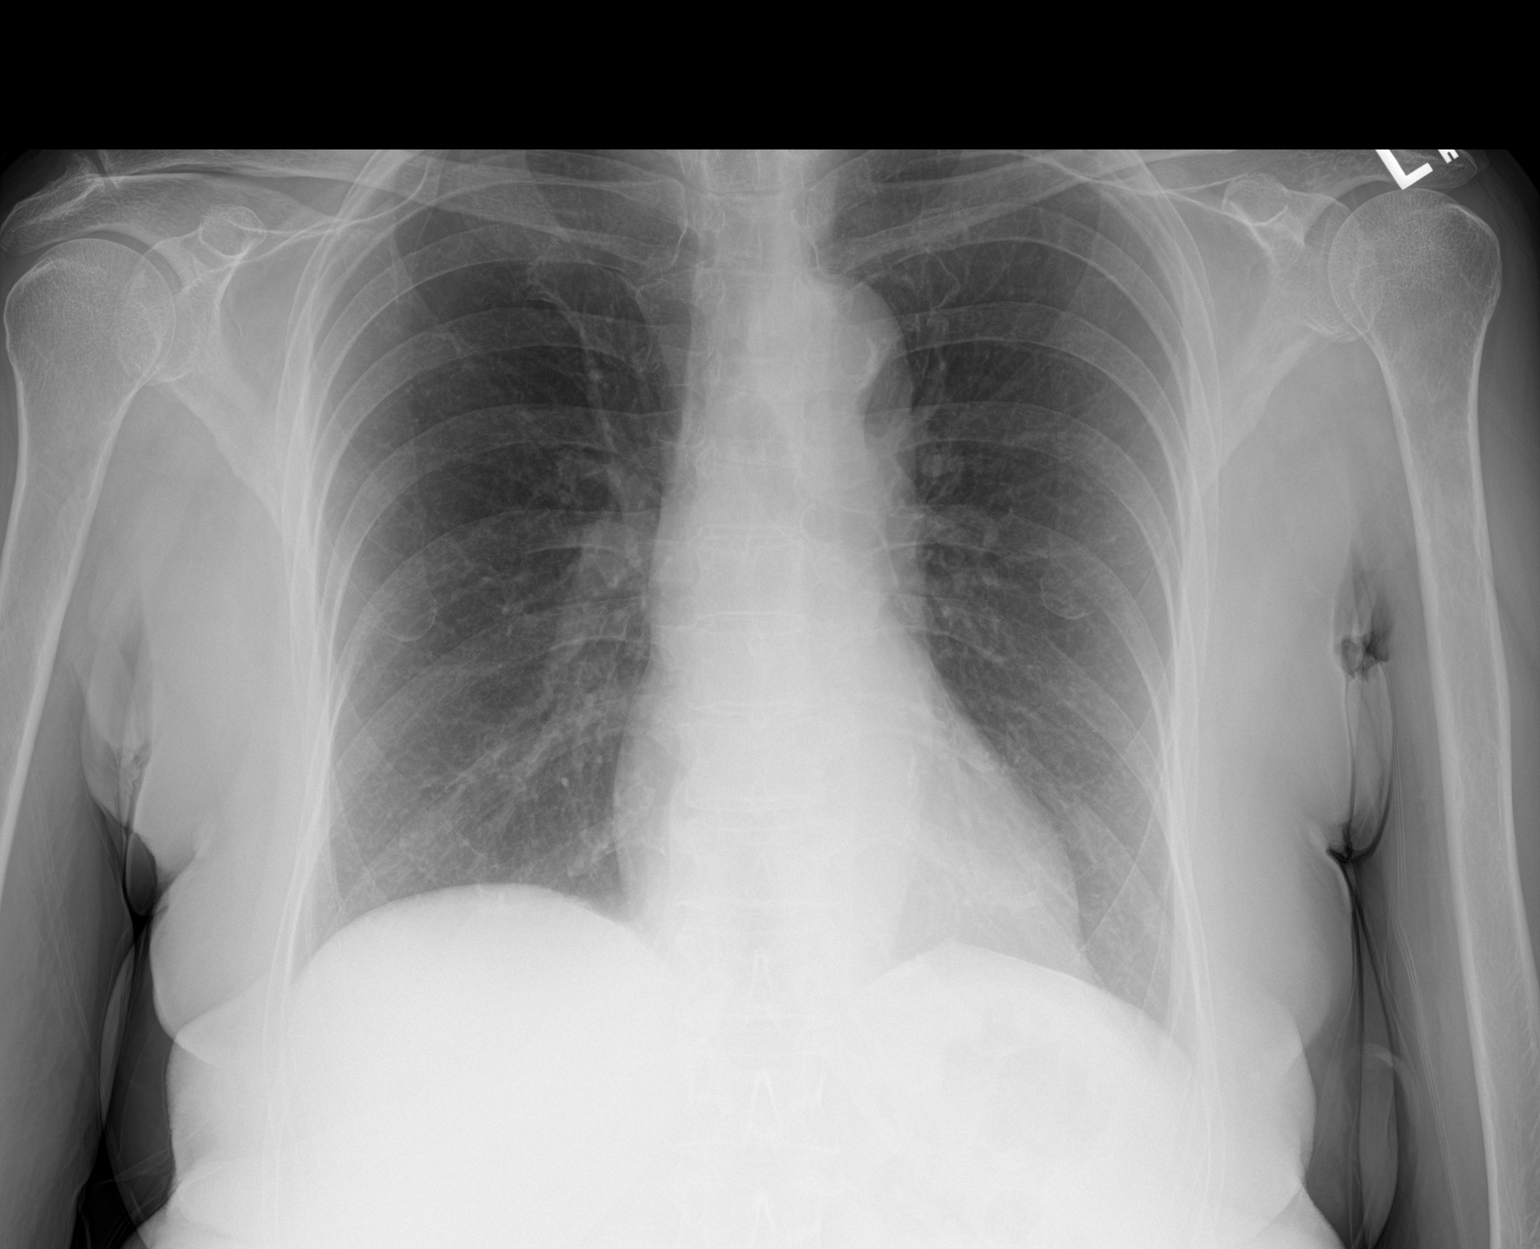

[1 of 1 positions shown; findings below may reference images not displayed]

FINDINGS: Cardiac shadows within normal limits. Aortic calcifications are
stable. Lungs are well aerated bilaterally without focal infiltrate
or sizable effusion. No bony abnormality is seen.
IMPRESSION: No acute abnormality noted.

## 2021-04-03 ENCOUNTER — Other Ambulatory Visit: Payer: Self-pay

## 2021-04-03 ENCOUNTER — Encounter: Payer: Self-pay | Admitting: Family Medicine

## 2021-04-03 ENCOUNTER — Ambulatory Visit (INDEPENDENT_AMBULATORY_CARE_PROVIDER_SITE_OTHER): Payer: Medicare Other | Admitting: Family Medicine

## 2021-04-03 VITALS — BP 103/66 | HR 74 | Ht 66.0 in | Wt 138.5 lb

## 2021-04-03 DIAGNOSIS — G4752 REM sleep behavior disorder: Secondary | ICD-10-CM | POA: Diagnosis not present

## 2021-04-03 DIAGNOSIS — G3184 Mild cognitive impairment, so stated: Secondary | ICD-10-CM | POA: Diagnosis not present

## 2021-04-03 MED ORDER — DONEPEZIL HCL 10 MG PO TABS: 10.0000 mg | ORAL_TABLET | Freq: Every day | ORAL | 3 refills | Status: DC

## 2021-04-03 MED ORDER — CLONAZEPAM 0.25 MG PO TBDP: ORAL_TABLET | ORAL | 5 refills | Status: DC
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
## 2021-04-03 NOTE — Patient Instructions (Signed)
Below is our plan:  We will continue clonazepam 0.25mg  and donepezil 10mg  daily at bedtime.   Please make sure you are staying well hydrated. I recommend 50-60 ounces daily. Well balanced diet and regular exercise encouraged. Consistent sleep schedule with 6-8 hours recommended.   Please continue follow up with care team as directed.   Follow up with me in 1 year   You may receive a survey regarding today's visit. I encourage you to leave honest feed back as I do use this information to improve patient care. Thank you for seeing me today!

## 2021-05-11 DIAGNOSIS — Z0100 Encounter for examination of eyes and vision without abnormal findings: Secondary | ICD-10-CM | POA: Diagnosis not present

## 2021-05-25 ENCOUNTER — Ambulatory Visit
Admission: RE | Admit: 2021-05-25 | Discharge: 2021-05-25 | Disposition: A | Discharge status: Home / Self Care | Payer: Medicare Other | Source: Ambulatory Visit | Attending: Internal Medicine | Admitting: Internal Medicine

## 2021-05-25 DIAGNOSIS — Z780 Asymptomatic menopausal state: Secondary | ICD-10-CM | POA: Diagnosis not present

## 2021-05-25 DIAGNOSIS — M85852 Other specified disorders of bone density and structure, left thigh: Secondary | ICD-10-CM | POA: Diagnosis not present

## 2021-05-25 DIAGNOSIS — M810 Age-related osteoporosis without current pathological fracture: Secondary | ICD-10-CM | POA: Diagnosis not present

## 2021-05-25 DIAGNOSIS — M8580 Other specified disorders of bone density and structure, unspecified site: Secondary | ICD-10-CM

## 2021-06-14 ENCOUNTER — Encounter: Payer: Self-pay | Admitting: Physician Assistant

## 2021-06-14 ENCOUNTER — Ambulatory Visit (INDEPENDENT_AMBULATORY_CARE_PROVIDER_SITE_OTHER): Payer: Medicare Other | Admitting: Physician Assistant

## 2021-06-14 ENCOUNTER — Other Ambulatory Visit: Payer: Self-pay | Admitting: Physician Assistant

## 2021-06-14 ENCOUNTER — Other Ambulatory Visit: Payer: Self-pay

## 2021-06-14 DIAGNOSIS — H00015 Hordeolum externum left lower eyelid: Secondary | ICD-10-CM

## 2021-06-14 DIAGNOSIS — Z85828 Personal history of other malignant neoplasm of skin: Secondary | ICD-10-CM | POA: Diagnosis not present

## 2021-06-14 DIAGNOSIS — Z1283 Encounter for screening for malignant neoplasm of skin: Secondary | ICD-10-CM | POA: Diagnosis not present

## 2021-06-14 DIAGNOSIS — L570 Actinic keratosis: Secondary | ICD-10-CM | POA: Diagnosis not present

## 2021-06-14 DIAGNOSIS — L820 Inflamed seborrheic keratosis: Secondary | ICD-10-CM

## 2021-06-14 MED ORDER — GENTAMICIN SULFATE 0.3 % OP OINT: TOPICAL_OINTMENT | OPHTHALMIC | 0 refills | Status: DC

## 2021-06-16 DIAGNOSIS — H0015 Chalazion left lower eyelid: Secondary | ICD-10-CM | POA: Diagnosis not present

## 2021-06-16 DIAGNOSIS — F430 Acute stress reaction: Secondary | ICD-10-CM | POA: Diagnosis not present

## 2021-06-25 DIAGNOSIS — H5713 Ocular pain, bilateral: Secondary | ICD-10-CM | POA: Diagnosis not present

## 2021-06-25 DIAGNOSIS — H0100B Unspecified blepharitis left eye, upper and lower eyelids: Secondary | ICD-10-CM | POA: Diagnosis not present

## 2021-06-25 DIAGNOSIS — H00015 Hordeolum externum left lower eyelid: Secondary | ICD-10-CM | POA: Diagnosis not present

## 2021-06-25 DIAGNOSIS — H10503 Unspecified blepharoconjunctivitis, bilateral: Secondary | ICD-10-CM | POA: Diagnosis not present

## 2021-06-25 DIAGNOSIS — H0100A Unspecified blepharitis right eye, upper and lower eyelids: Secondary | ICD-10-CM | POA: Diagnosis not present

## 2021-06-28 DIAGNOSIS — I700 Atherosclerosis of aorta: Secondary | ICD-10-CM | POA: Diagnosis not present

## 2021-06-28 DIAGNOSIS — E785 Hyperlipidemia, unspecified: Secondary | ICD-10-CM | POA: Diagnosis not present

## 2021-06-28 DIAGNOSIS — I10 Essential (primary) hypertension: Secondary | ICD-10-CM | POA: Diagnosis not present

## 2021-06-28 DIAGNOSIS — Z79899 Other long term (current) drug therapy: Secondary | ICD-10-CM | POA: Diagnosis not present

## 2021-06-28 DIAGNOSIS — M8580 Other specified disorders of bone density and structure, unspecified site: Secondary | ICD-10-CM | POA: Diagnosis not present

## 2021-06-28 DIAGNOSIS — G3184 Mild cognitive impairment, so stated: Secondary | ICD-10-CM | POA: Diagnosis not present

## 2021-06-28 DIAGNOSIS — I77810 Thoracic aortic ectasia: Secondary | ICD-10-CM | POA: Diagnosis not present

## 2021-06-28 DIAGNOSIS — Z0000 Encounter for general adult medical examination without abnormal findings: Secondary | ICD-10-CM | POA: Diagnosis not present

## 2021-07-05 ENCOUNTER — Encounter: Payer: Self-pay | Admitting: Physician Assistant

## 2021-07-05 NOTE — Progress Notes (Signed)
? ?  New Patient ?  ?Subjective  ?Kara Tapia is a 81 y.o. female who presents for the following: New Patient (Initial Visit) (Wants full body skin examination- Personal history of non mole skin cancer but no melanoma. Patient does have family history of melanoma.). She has a bothersome, inflamed cyst on her left lower eye lid. No treatment.  ? ? ?The following portions of the chart were reviewed this encounter and updated as appropriate:  Tobacco  Allergies  Meds  Problems  Med Hx  Surg Hx  Fam Hx   ?  ? ?Objective  ?Well appearing patient in no apparent distress; mood and affect are within normal limits. ? ?A full examination was performed including scalp, head, eyes, ears, nose, lips, neck, chest, axillae, abdomen, back, buttocks, bilateral upper extremities, bilateral lower extremities, hands, feet, fingers, toes, fingernails, and toenails. All findings within normal limits unless otherwise noted below. ? ?No atypical nevi or signs of NMSC noted at the time of the visit.  ? ?Dorsum of nose ?Erythematous patches with gritty scale. ? ?Left Upper Arm - Anterior, Mid Back (5) ?Stuck-on, waxy, tan-brown plaques on an erythematous base.  ? ?Left Upper Eyelid ?Small erythematous cyst ? ? ?Assessment & Plan  ?AK (actinic keratosis) ?Dorsum of nose ? ?Destruction of lesion - Dorsum of nose ?Complexity: simple   ?Destruction method: cryotherapy   ?Informed consent: discussed and consent obtained   ?Timeout:  patient name, date of birth, surgical site, and procedure verified ?Lesion destroyed using liquid nitrogen: Yes   ?Cryotherapy cycles:  3 ?Outcome: patient tolerated procedure well with no complications   ? ?Encounter for screening for malignant neoplasm of skin ? ?Yearly skin examination  ? ?Seborrheic keratosis, inflamed (6) ?Left Upper Arm - Anterior; Mid Back (5) ? ?Destruction of lesion - Left Upper Arm - Anterior, Mid Back ?Complexity: simple   ?Destruction method: cryotherapy   ?Informed consent:  discussed and consent obtained   ?Timeout:  patient name, date of birth, surgical site, and procedure verified ?Lesion destroyed using liquid nitrogen: Yes   ?Cryotherapy cycles:  3 ?Outcome: patient tolerated procedure well with no complications   ? ?Hordeolum externum of left lower eyelid ?Left Upper Eyelid ? ?gentamicin (GARAMYCIN) 0.3 % ophthalmic ointment - Left Upper Eyelid ?Use on the stye qd ? ?Ophthamologist if persistent. ? ?I, Devean Skoczylas, PA-C, have reviewed all documentation's for this visit.  The documentation on 07/05/21 for the exam, diagnosis, procedures and orders are all accurate and complete. ?

## 2021-07-23 ENCOUNTER — Ambulatory Visit (HOSPITAL_COMMUNITY): Payer: Medicare Other

## 2021-07-31 DIAGNOSIS — M8580 Other specified disorders of bone density and structure, unspecified site: Secondary | ICD-10-CM | POA: Diagnosis not present

## 2021-08-03 ENCOUNTER — Other Ambulatory Visit: Payer: Self-pay

## 2021-08-03 ENCOUNTER — Telehealth: Payer: Self-pay | Admitting: Pharmacy Technician

## 2021-08-03 DIAGNOSIS — M810 Age-related osteoporosis without current pathological fracture: Secondary | ICD-10-CM | POA: Insufficient documentation

## 2021-08-03 NOTE — Telephone Encounter (Signed)
Auth Submission: NO AUTH NEEDED ?Payer: BCBS MEDICARE ?Medication & CPT/J Code(s) submitted: Reclast (Zolendronic acid) J3489 ?Route of submission (phone, fax, portal): PHONE ?Auth type: Buy/Bill ?Units/visits requested: X1 DOSE ?Reference number:  ?Approval from: 08/03/21 to 03/24/22  ?  ?

## 2021-08-17 ENCOUNTER — Ambulatory Visit (INDEPENDENT_AMBULATORY_CARE_PROVIDER_SITE_OTHER): Payer: Medicare Other

## 2021-08-17 VITALS — BP 101/61 | HR 68 | Temp 98.6°F | Resp 18 | Ht 66.0 in | Wt 137.2 lb

## 2021-08-17 DIAGNOSIS — M810 Age-related osteoporosis without current pathological fracture: Secondary | ICD-10-CM

## 2021-08-17 MED ORDER — ACETAMINOPHEN 325 MG PO TABS
650.0000 mg | ORAL_TABLET | Freq: Once | ORAL | Status: AC
  Administered 2021-08-17: 650 mg via ORAL
  Filled 2021-08-17: qty 2

## 2021-08-17 MED ORDER — DIPHENHYDRAMINE HCL 25 MG PO CAPS
25.0000 mg | ORAL_CAPSULE | Freq: Once | ORAL | Status: AC
  Administered 2021-08-17: 25 mg via ORAL
  Filled 2021-08-17: qty 1

## 2021-08-17 MED ORDER — ZOLEDRONIC ACID 5 MG/100ML IV SOLN
5.0000 mg | Freq: Once | INTRAVENOUS | Status: AC
  Administered 2021-08-17: 5 mg via INTRAVENOUS
  Filled 2021-08-17: qty 100

## 2021-08-17 MED ORDER — SODIUM CHLORIDE 0.9 % IV SOLN: INTRAVENOUS | Status: DC

## 2021-08-17 NOTE — Progress Notes (Signed)
Diagnosis: Senile Osteoporosis  Provider:  Marshell Garfinkel, MD  Procedure: Infusion  IV Type: Peripheral, IV Location: L Forearm  Reclast (Zolendronic Acid), Dose: 5 mg  Infusion Start Time: 9417  Infusion Stop Time: 1423  Post Infusion IV Care: Observation period completed and Peripheral IV Discontinued  Discharge: Condition: Good, Destination: Home . AVS provided to patient.   Performed by:  Cleophus Molt, RN

## 2021-08-17 NOTE — Patient Instructions (Signed)

## 2021-10-01 ENCOUNTER — Telehealth: Payer: Self-pay

## 2021-10-01 NOTE — Telephone Encounter (Signed)
Pt scheduled to see Dr. Henrene Pastor tomorrow at 4pm, pt knows to arrive at 3:45pm. She is aware of appt.

## 2021-10-01 NOTE — Telephone Encounter (Signed)
-----   Message from Irene Shipper, MD sent at 10/01/2021  9:28 AM EDT ----- Regarding: Appointment Vaughan Basta, This woman is the mother of a friend of my wife.  Having problems with diarrhea.  Please add her onto my schedule tomorrow at 4 PM in the office.  Thanks.  Please call her with the appointment.  Have her arrive 15 minutes early.  Thanks

## 2021-10-02 ENCOUNTER — Encounter: Payer: Self-pay | Admitting: Internal Medicine

## 2021-10-02 ENCOUNTER — Other Ambulatory Visit (INDEPENDENT_AMBULATORY_CARE_PROVIDER_SITE_OTHER): Payer: Medicare Other

## 2021-10-02 ENCOUNTER — Ambulatory Visit (INDEPENDENT_AMBULATORY_CARE_PROVIDER_SITE_OTHER): Payer: Medicare Other | Admitting: Internal Medicine

## 2021-10-02 VITALS — BP 96/60 | HR 76 | Ht 64.25 in | Wt 135.0 lb

## 2021-10-02 DIAGNOSIS — R197 Diarrhea, unspecified: Secondary | ICD-10-CM

## 2021-10-02 LAB — CBC WITH DIFFERENTIAL/PLATELET
Basophils Absolute: 0.1 10*3/uL (ref 0.0–0.1)
Basophils Relative: 0.9 % (ref 0.0–3.0)
Eosinophils Absolute: 0.1 10*3/uL (ref 0.0–0.7)
Eosinophils Relative: 1.6 % (ref 0.0–5.0)
HCT: 39.7 % (ref 36.0–46.0)
Hemoglobin: 13.4 g/dL (ref 12.0–15.0)
Lymphocytes Relative: 39.1 % (ref 12.0–46.0)
Lymphs Abs: 2.2 10*3/uL (ref 0.7–4.0)
MCHC: 33.8 g/dL (ref 30.0–36.0)
MCV: 97.9 fl (ref 78.0–100.0)
Monocytes Absolute: 0.3 10*3/uL (ref 0.1–1.0)
Monocytes Relative: 6.3 % (ref 3.0–12.0)
Neutro Abs: 2.9 10*3/uL (ref 1.4–7.7)
Neutrophils Relative %: 52.1 % (ref 43.0–77.0)
Platelets: 232 10*3/uL (ref 150.0–400.0)
RBC: 4.05 Mil/uL (ref 3.87–5.11)
RDW: 12.4 % (ref 11.5–15.5)
WBC: 5.5 10*3/uL (ref 4.0–10.5)

## 2021-10-02 LAB — COMPREHENSIVE METABOLIC PANEL
ALT: 14 U/L (ref 0–35)
AST: 17 U/L (ref 0–37)
Albumin: 4.5 g/dL (ref 3.5–5.2)
Alkaline Phosphatase: 65 U/L (ref 39–117)
BUN: 15 mg/dL (ref 6–23)
CO2: 28 mEq/L (ref 19–32)
Calcium: 9 mg/dL (ref 8.4–10.5)
Chloride: 100 mEq/L (ref 96–112)
Creatinine, Ser: 0.94 mg/dL (ref 0.40–1.20)
GFR: 57.45 mL/min — ABNORMAL LOW (ref >60.00)
Glucose, Bld: 90 mg/dL (ref 70–99)
Potassium: 4.2 mEq/L (ref 3.5–5.1)
Sodium: 134 mEq/L — ABNORMAL LOW (ref 135–145)
Total Bilirubin: 0.4 mg/dL (ref 0.2–1.2)
Total Protein: 6.7 g/dL (ref 6.0–8.3)

## 2021-10-02 MED ORDER — METRONIDAZOLE 250 MG PO TABS: 250.0000 mg | ORAL_TABLET | Freq: Three times a day (TID) | ORAL | 0 refills | Status: DC

## 2021-10-02 NOTE — Patient Instructions (Signed)
If you are age 80 or older, your body mass index should be between 23-30. Your Body mass index is 22.99 kg/m. If this is out of the aforementioned range listed, please consider follow up with your Primary Care Provider.  If you are age 32 or younger, your body mass index should be between 19-25. Your Body mass index is 22.99 kg/m. If this is out of the aformentioned range listed, please consider follow up with your Primary Care Provider.   ________________________________________________________  The [facility] providers would like to encourage you to use [facility] to communicate with providers for non-urgent requests or questions.  Due to long hold times on the telephone, sending your provider a message by [facility] may be a faster and more efficient way to get a response.  Please allow 48 business hours for a response.  Please remember that this is for non-urgent requests.  _______________________________________________________  Your provider has requested that you go to the basement level for lab work before leaving today. Press "B" on the elevator. The lab is located at the first door on the left as you exit the elevator.  We have sent the following medications to your pharmacy for you to pick up at your convenience:  Flagyl  Pick up Imodium over the counter and take as directed

## 2021-10-02 NOTE — Progress Notes (Signed)
HISTORY OF PRESENT ILLNESS:  Kara Tapia is a 81 y.o. 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  The patient tells me that she typically had normal bowel movements or tended toward constipation until around March 2023.  At that point she developed problems with diarrhea.  3-6 loose bowel movements per day.  Generally not at night.  Mostly in the morning but can occur later in the day as well.  She has had some incontinent episodes.  She thinks she may have had antibiotics in the past 6 months, but is not sure.  She has been on Aricept for 5 years without change in dosage.  She saw her PCP in March.  Was prescribed probiotic.  Thought maybe this helped a little.  No weight loss.  No bleeding.  She apparently underwent colonoscopy with Dr. Benson Norway in 2021.  We have requested that report.  She denies a prior history of similar issues.  Review of blood work from March 2023 shows unremarkable comprehensive metabolic panel with normal potassium.  Unremarkable CBC with hemoglobin 4.  Normal vitamin D and TSH.  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 She has been smoking an average of .5 packs per day. She has never used smokeless tobacco. She reports current alcohol use of about 5.0 standard drinks of alcohol per week. She reports that she does not use drugs.  family history includes Bone cancer in her father; COPD in her sister; Cancer in her mother; Dementia in her daughter; Diabetes in her mother and sister; Down syndrome in her daughter; Heart attack in her paternal grandfather; Heart disease in her mother; Lung disease in her sister; Other in her daughter.  Allergies  Allergen Reactions   Caine-1 [Lidocaine] Rash and Other (See Comments)    Affects her BP       PHYSICAL EXAMINATION: Vital signs: BP 96/60 (BP Location: Left Arm, Patient Position: Sitting, Cuff Size: Normal)   Pulse 76   Ht 5' 4.25" (1.632 m) Comment: height measured without shoes  Wt 135 lb (61.2 kg)   BMI 22.99 kg/m   Constitutional: generally well-appearing, no acute distress Psychiatric:  alert and oriented x3, cooperative Eyes: extraocular movements intact, anicteric, conjunctiva pink Mouth: oral pharynx moist, no lesions Neck: supple no lymphadenopathy Cardiovascular: heart regular rate and rhythm, no murmur Lungs: clear to auscultation bilaterally Abdomen: soft, nontender, nondistended, no obvious ascites, no peritoneal signs, normal bowel sounds, no organomegaly Rectal: Omitted Extremities: no clubbing, cyanosis, or lower extremity edema bilaterally Skin: no lesions on visible  extremities Neuro: No focal deficits.  Cranial nerves intact  ASSESSMENT:  1.  68-monthhistory of diarrhea as described.  Rule out infectious cause.  Rule out postinfectious IBS.  Rule out microscopic colitis. 2.  Prior colonoscopy elsewhere 2021.  Being requested   PLAN:  1.  CBC, comprehensive metabolic panel today. 2.  Stool for enteric pathogens including C. difficile 3.  Empiric treatment with metronidazole 250 mg 3 times daily for 1 week.  Advised to avoid alcohol during this treatment. 4.  Over-the-counter Imodium.  Take as directed 5.  Office follow-up in 1 month.  If problem persists without response to the above or obvious cause found, then would recommend colonoscopy with biopsies to rule out microscopic colitis.  She understands. A total time of 45 minutes was spent preparing to see the patient, reviewing outside data, obtaining comprehensive history, performing comprehensive physical examination, counseling and educating the patient regarding the above listed issues, ordering blood work, stool studies, medication.  Arranging follow-up.  And documenting clinical information in the health record

## 2021-10-09 ENCOUNTER — Other Ambulatory Visit: Payer: Medicare Other

## 2021-10-09 DIAGNOSIS — R197 Diarrhea, unspecified: Secondary | ICD-10-CM | POA: Diagnosis not present

## 2021-10-11 LAB — GI PROFILE, STOOL, PCR

## 2021-10-12 ENCOUNTER — Telehealth: Payer: Self-pay

## 2021-10-12 NOTE — Telephone Encounter (Signed)
Received call from [facility] that GI profile confirmed Enteropathogenic E coli. Will route to MD.

## 2021-10-12 NOTE — Telephone Encounter (Signed)
Patient has completed antibiotics & is no longer experiencing any diarrhea. She is still having frequent bowel movements, but the stool is formed. She takes imodium PRN. The stool sample was turned in after she started the antibiotics.

## 2021-10-12 NOTE — Telephone Encounter (Signed)
Spoke with pt regarding MD recommendations. She plans to keep follow up as scheduled.

## 2021-11-06 ENCOUNTER — Ambulatory Visit: Payer: Medicare Other | Admitting: Internal Medicine

## 2021-12-02 NOTE — Progress Notes (Addendum)
12/02/2021 Kara Tapia [id number] Nov 18, 1940   Chief Complaint: Diarrhea   History of Present Illness: Raylynne Cubbage is an 81 year old female with a past medical history of arthritis, migraine headaches, GERD and colon polyps. She was initially seen by Dr. Henrene Pastor 10/02/2021. She was prescribed Metronidazole '250mg'$  one tab tid x 7 days. A GI pathogen panel 10/09/2021 was positive for enteropathogenic E. Coli. Her diarrhea abated after she took the course of Metronidazole. She was instructed to take Imodium PRN. A colonoscopy was discussed if her diarrhea persisted or worsened.   She presents today for further evaluation regarding sporadic diarrhea. She stated the diarrhea she experienced in July 2023 was much worse (had 3 to 4 episodes of diarrhea daily).  She describes passing 1 to 2 nonbloody loose stools two days weekly for the past 4 to 6 weeks. No associated abdominal pain or cramping but sometimes feels abdominal pressure which abates after she passes the diarrhea BM. No bloody diarrhea or mucous per the rectum. The other days of the week, she passes a normal formed brown stool or she might skip a day without passing a BM. Fatty foods or eating corn might trigger days of diarrhea. She drinks 3 cups of coffee daily and two days weekly drinks 2 additional cups in the afternoon. Dairy intake is limited, she eats cereal with milk once or twice weekly. She hasn't needed to take any Imodium for several weeks. No further antibiotic use since she took Flagyl as noted above. She has been on Aricept x 5+ years without recent dose change. She has lost 3 to 4 lbs over the past few months. No fevers. She lives  by herself with her cat.   She reported completing a colonoscopy with Dr. Benson Norway in 2021 which she reported was normal. These records were previously requested but were not received. She possibly had colon polyps removed per colonoscopy in 2011.     Latest Ref Rng & Units 10/02/2021    4:43 PM  10/05/2018    8:56 PM 03/24/2015    5:02 AM  CBC  WBC 4.0 - 10.5 K/uL 5.5  6.6  6.6   Hemoglobin 12.0 - 15.0 g/dL 13.4  15.8  15.3   Hematocrit 36.0 - 46.0 % 39.7  45.9  45.1   Platelets 150.0 - 400.0 K/uL 232.0  252  255        Latest Ref Rng & Units 10/02/2021    4:43 PM 11/18/2018    2:18 PM 10/05/2018    8:56 PM  CMP  Glucose 70 - 99 mg/dL 90  83  97   BUN 6 - 23 mg/dL '15  15  12   '$ Creatinine 0.40 - 1.20 mg/dL 0.94  0.90  0.90   Sodium 135 - 145 mEq/L 134  139  138   Potassium 3.5 - 5.1 mEq/L 4.2  5.3  4.2   Chloride 96 - 112 mEq/L 100  101  101   CO2 19 - 32 mEq/L '28  22  26   '$ Calcium 8.4 - 10.5 mg/dL 9.0  9.8  9.9   Total Protein 6.0 - 8.3 g/dL 6.7   7.9   Total Bilirubin 0.2 - 1.2 mg/dL 0.4   0.6   Alkaline Phos 39 - 117 U/L 65   89   AST 0 - 37 U/L 17   22   ALT 0 - 35 U/L 14   20     Current Outpatient  Medications on File Prior to Visit  Medication Sig Dispense Refill   amLODipine (NORVASC) 2.5 MG tablet Take 2.5 mg by mouth daily.     CALCIUM-MAGNESIUM-VITAMIN D PO Take 1 tablet by mouth daily.     clonazePAM (KLONOPIN) 0.25 MG disintegrating tablet DISSOLVE 1 TABLET UNDER THE TONGUE DAILY AT BEDTIME. 30 tablet 5   donepezil (ARICEPT) 10 MG tablet Take 1 tablet (10 mg total) by mouth at bedtime. 90 tablet 3   metroNIDAZOLE (FLAGYL) 250 MG tablet Take 1 tablet (250 mg total) by mouth 3 (three) times daily. 21 tablet 0   rosuvastatin (CRESTOR) 5 MG tablet Take one tablet by mouth three times a week. 36 tablet 6   amLODipine (NORVASC) 5 MG tablet Take 1 tablet (5 mg total) by mouth daily. (Patient not taking: Reported on 12/03/2021) 30 tablet 0   triamcinolone (NASACORT) 55 MCG/ACT AERO nasal inhaler Place 2 sprays into the nose daily. 2 sprays each nostril at night (Patient not taking: Reported on 12/03/2021) 1 each 12   Current Facility-Administered Medications on File Prior to Visit  Medication Dose Route Frequency Provider Last Rate Last Admin   azelastine (ASTELIN) 0.1  % nasal spray 1 spray  1 spray Each Nare BID Rozetta Nunnery, MD       Allergies  Allergen Reactions   Caine-1 [Lidocaine] Rash and Other (See Comments)    Affects her BP    Current Medications, Allergies, Past Medical History, Past Surgical History, Family History and Social History were reviewed in [facility] record.  Review of Systems:   Constitutional: Negative for fever, sweats, chills or weight loss.  Respiratory: Negative for shortness of breath.   Cardiovascular: Negative for chest pain, palpitations and leg swelling.  astrointestinal: See HPI.  Musculoskeletal: Negative for back pain or muscle aches.  Neurological: Negative for dizziness, headaches or paresthesias.    Physical Exam: BP (!) 140/80   Pulse 60   Wt 133 lb 9.6 oz (60.6 kg)   SpO2 90%   BMI 22.75 kg/m   Wt Readings from Last 3 Encounters:  12/03/21 133 lb 9.6 oz (60.6 kg)  10/02/21 135 lb (61.2 kg)  08/17/21 137 lb 3.2 oz (62.2 kg)    General: 81 year old female in no acute distress. Head: Normocephalic and atraumatic. Eyes: No scleral icterus. Conjunctiva pink . Ears: Normal auditory acuity. Mouth: Dentition intact. No ulcers or lesions.  Lungs: Clear throughout to auscultation. Heart: Regular rate and rhythm, no murmur. Abdomen: Soft, nontender and nondistended. No masses or hepatomegaly. Normal bowel sounds x 4 quadrants.  Rectal: Deferred. Musculoskeletal: Symmetrical with no gross deformities. Extremities: No edema. Neurological: Alert oriented x 4. No focal deficits.  Psychological: Alert and cooperative. Normal mood and affect  Assessment and Recommendations:  66) 81 year old female with recurrent intermittent diarrhea. She was treated with a course of Flagyl x 7 days 09/2021. GI pathogen panel + for enteropathogenic E. Coli. Her diarrhea abated for a few weeks post Flagyl treatment then had recurrence with less frequent diarrhea 1 to 2 episodes two days weekly.  No abdominal pan. Suspect post infectious IBS vs caffeine induced diarrhea vs side effect from Aricept. -Reduce coffee intake, drink 1 cup of coffee in the morning and 1/2 cup of coffee in the afternoon -Increase water intake to 6 to 8 glasses daily  Imodium one tab bid as needed  -Patient to contact me in 2 weeks with an update -If no improvement, will consider adding Lactaid with each  dairy product and Benefiber -Diagnostic colonoscopy if diarrhea persists or worsens -Send second request for 2021 colonoscopy procedure and biopsy report from Dr. Ulyses Amor office  ADDENDUM: Colonoscopy 02/27/2016 by Dr. Benson Norway records received. One 29m polyp was removed from the transverse colon and diverticulosis was noted in the sigmoid, ascending colon and cecum. The colonoscopy was somewhat difficulty due to significant looping and a tortuous colon. The bowel prep was excellent. Path report consistent with a tubular adenomatous polyp without high grade dysplasia or malignancy. No repeat colonoscopy recommended due to age.

## 2021-12-03 ENCOUNTER — Ambulatory Visit (INDEPENDENT_AMBULATORY_CARE_PROVIDER_SITE_OTHER): Payer: Medicare Other | Admitting: Nurse Practitioner

## 2021-12-03 ENCOUNTER — Encounter: Payer: Self-pay | Admitting: Nurse Practitioner

## 2021-12-03 VITALS — BP 140/80 | HR 60 | Wt 133.6 lb

## 2021-12-03 DIAGNOSIS — R197 Diarrhea, unspecified: Secondary | ICD-10-CM | POA: Diagnosis not present

## 2021-12-03 NOTE — Progress Notes (Signed)
Thank you for seeing Kara Tapia.  Her daughter is a good friend.

## 2021-12-03 NOTE — Patient Instructions (Signed)
If you are age 80 or older, your body mass index should be between 23-30. Your Body mass index is 22.75 kg/m. If this is out of the aforementioned range listed, please consider follow up with your Primary Care Provider.  If you are age 56 or younger, your body mass index should be between 19-25. Your Body mass index is 22.75 kg/m. If this is out of the aformentioned range listed, please consider follow up with your Primary Care Provider.   ________________________________________________________  The [facility] providers would like to encourage you to use [facility] to communicate with providers for non-urgent requests or questions.  Due to long hold times on the telephone, sending your provider a message by [facility] may be a faster and more efficient way to get a response.  Please allow 48 business hours for a response.  Please remember that this is for non-urgent requests.  _______________________________________________________   Reduce coffee intake to 1 cup in the morning and 1/2 cup in the afternoon.  Contact Colleen, NP in 2 weeks with an update.  It was a pleasure to see you today!  Thank you for trusting me with your gastrointestinal care!

## 2021-12-17 ENCOUNTER — Other Ambulatory Visit: Payer: Self-pay

## 2021-12-17 ENCOUNTER — Telehealth: Payer: Self-pay | Admitting: Nurse Practitioner

## 2021-12-17 MED ORDER — CLONAZEPAM 0.25 MG PO TBDP
ORAL_TABLET | ORAL | 5 refills | Status: DC
Start: 2021-12-17 — End: 2022-02-11

## 2021-12-17 NOTE — Telephone Encounter (Signed)
Patient called, states she received a call from someone in our office.Requesting a call back. Please call to advise.

## 2021-12-17 NOTE — Telephone Encounter (Signed)
Pt has up coming appt and was checked in registry.

## 2021-12-18 NOTE — Telephone Encounter (Signed)
Spoke with patient & let her know that I did not see any documentation from anyone at our office who may have tried to call her; however, it does look like based on her last OV note that Jaclyn Shaggy, NP did ask for a two week symptom update. Patient stated she is doing well, however the bloating & diarrhea is still occurring occasionally. She's been taking imodium which provides relief. She has been advised that if Jaclyn Shaggy has any further recommendations then our office will be in touch.

## 2021-12-19 NOTE — Telephone Encounter (Signed)
Spoke with pt and notified her of Dr. Lyndel Safe recommendations: Dr. Benson Norway Office contacted and they stated that they needed a medical release form signed by the pt to release recent Colonoscopy and path report records: Pt was notified and stated that she will come by our office to sign document tomorrow:  Pt verbalized understanding with all questions answered.

## 2021-12-19 NOTE — Telephone Encounter (Signed)
Remo Lipps, pls follow up on prior request to obtain her 2021 colonoscopy procedure and biopsy report from Dr. Benson Norway. Pls advise patient to take Lactaid 1 to 2 tabs with each dairy product. Patient to contact office if diarrhea frequency increases. THX

## 2021-12-24 NOTE — Telephone Encounter (Unsigned)
Document faxed to Dr. Benson Norway office Fax: Number [phone]: Requesting Recent colonoscopy report and pathology report:

## 2021-12-25 ENCOUNTER — Ambulatory Visit: Payer: Medicare Other | Admitting: Physician Assistant

## 2021-12-26 NOTE — Telephone Encounter (Signed)
Dr. Benson Norway office contacted  Requesting Recent colonoscopy report and pathology report: Lattie Haw sated that she would fax them over:  Fax # [phone]  Fax # [phone]

## 2021-12-28 NOTE — Telephone Encounter (Signed)
Results not received Dr. Benson Norway office contacted  Requesting Recent colonoscopy report and pathology report: Lattie Haw sated that she would fax them over:  Fax # [phone]  Fax # [phone]

## 2021-12-28 NOTE — Telephone Encounter (Signed)
Results received:  Copies made: Original sent to be scanned into Epic 1 copy placed on [place] NP desk

## 2022-01-01 DIAGNOSIS — I10 Essential (primary) hypertension: Secondary | ICD-10-CM | POA: Diagnosis not present

## 2022-01-01 DIAGNOSIS — Z720 Tobacco use: Secondary | ICD-10-CM | POA: Diagnosis not present

## 2022-01-01 DIAGNOSIS — I700 Atherosclerosis of aorta: Secondary | ICD-10-CM | POA: Diagnosis not present

## 2022-01-01 DIAGNOSIS — E785 Hyperlipidemia, unspecified: Secondary | ICD-10-CM | POA: Diagnosis not present

## 2022-02-04 ENCOUNTER — Other Ambulatory Visit: Payer: Self-pay | Admitting: Internal Medicine

## 2022-02-04 DIAGNOSIS — Z1231 Encounter for screening mammogram for malignant neoplasm of breast: Secondary | ICD-10-CM

## 2022-02-11 ENCOUNTER — Other Ambulatory Visit: Payer: Self-pay

## 2022-02-11 MED ORDER — CLONAZEPAM 0.25 MG PO TBDP: ORAL_TABLET | ORAL | 5 refills | Status: DC

## 2022-02-12 ENCOUNTER — Other Ambulatory Visit: Payer: Self-pay | Admitting: Family Medicine

## 2022-02-13 ENCOUNTER — Telehealth: Payer: Self-pay | Admitting: Family Medicine

## 2022-02-13 ENCOUNTER — Other Ambulatory Visit: Payer: Self-pay | Admitting: *Deleted

## 2022-02-13 MED ORDER — CLONAZEPAM 0.5 MG PO TABS: 0.2500 mg | ORAL_TABLET | Freq: Every day | ORAL | 5 refills | Status: DC

## 2022-02-13 NOTE — Telephone Encounter (Signed)
Called pharmacy back. Clonazepam ODT on long term back order. Asking for regular table to be called in to take the place right now. Aware I will send to MD to review since NP out.

## 2022-02-13 NOTE — Telephone Encounter (Signed)
Emily(tech) [employer]  [place]  Is asking for a call to discuss pt's clonazePAM

## 2022-02-13 NOTE — Progress Notes (Signed)
Ordered the regular clonazepam instead of XR due to supply chain delay. CD

## 2022-03-27 ENCOUNTER — Encounter: Payer: Self-pay | Admitting: Pulmonary Disease

## 2022-03-28 ENCOUNTER — Encounter: Payer: Self-pay | Admitting: Pulmonary Disease

## 2022-04-03 ENCOUNTER — Encounter: Payer: Self-pay | Admitting: Pulmonary Disease

## 2022-04-03 ENCOUNTER — Ambulatory Visit
Admission: RE | Admit: 2022-04-03 | Discharge: 2022-04-03 | Disposition: A | Discharge status: Home / Self Care | Payer: Medicare Other | Source: Ambulatory Visit | Attending: Internal Medicine | Admitting: Internal Medicine

## 2022-04-03 DIAGNOSIS — Z1231 Encounter for screening mammogram for malignant neoplasm of breast: Secondary | ICD-10-CM | POA: Diagnosis not present

## 2022-04-03 NOTE — Patient Instructions (Signed)
Below is our plan:  We will continue clonazepam 0.'25mg'$  PRN. Continue donepezil 10at bedtime. Continue to monitor ocular symptoms. Stay up to date with eye exams. Let me know if headaches worsen.   Please make sure you are staying well hydrated. I recommend 50-60 ounces daily. Well balanced diet and regular exercise encouraged. Consistent sleep schedule with 6-8 hours recommended.   Please continue follow up with care team as directed.   Follow up with me in 1 year   You may receive a survey regarding today's visit. I encourage you to leave honest feed back as I do use this information to improve patient care. Thank you for seeing me today!   Management of Memory Problems   There are some general things you can do to help manage your memory problems.  Your memory may not in fact recover, but by using techniques and strategies you will be able to manage your memory difficulties better.   1)  Establish a routine. Try to establish and then stick to a regular routine.  By doing this, you will get used to what to expect and you will reduce the need to rely on your memory.  Also, try to do things at the same time of day, such as taking your medication or checking your calendar first thing in the morning. Think about think that you can do as a part of a regular routine and make a list.  Then enter them into a daily planner to remind you.  This will help you establish a routine.   2)  Organize your environment. Organize your environment so that it is uncluttered.  Decrease visual stimulation.  Place everyday items such as keys or cell phone in the same place every day (ie.  Basket next to front door) Use post it notes with a brief message to yourself (ie. Turn off light, lock the door) Use labels to indicate where things go (ie. Which cupboards are for food, dishes, etc.) Keep a notepad and pen by the telephone to take messages   3)  Memory Aids A diary or journal/notebook/daily planner Making a  list (shopping list, chore list, to do list that needs to be done) Using an alarm as a reminder (kitchen timer or cell phone alarm) Using cell phone to store information (Notes, Calendar, Reminders) Calendar/White board placed in a prominent position Post-it notes   In order for memory aids to be useful, you need to have good habits.  It's no good remembering to make a note in your journal if you don't remember to look in it.  Try setting aside a certain time of day to look in journal.   4)  Improving mood and managing fatigue. There may be other factors that contribute to memory difficulties.  Factors, such as anxiety, depression and tiredness can affect memory. Regular gentle exercise can help improve your mood and give you more energy. Exercise: there are short videos created by the [employer] on Health specially for older adults: https://bit.ly/2I30q97.  Mediterranean diet: which emphasizes fruits, vegetables, whole grains, legumes, fish, and other seafood; unsaturated fats such as olive oils; and low amounts of red meat, eggs, and sweets. A variation of this, called MIND ([place] Intervention for Neurodegenerative Delay) incorporates the DASH (Dietary Approaches to Stop Hypertension) diet, which has been shown to lower high blood pressure, a risk factor for Alzheimer's disease. More information at: RepublicForum.gl.  Aerobic exercise that improve heart health is also good for the mind.  [employer]  on Aging have short videos for exercises that you can do at home: GoldCloset.com.ee Simple relaxation techniques may help relieve symptoms of anxiety Try to get back to completing activities or hobbies you enjoyed doing in the past. Learn to pace yourself through activities to decrease fatigue. Find out about some local support groups where you can share experiences with others. Try and  achieve 7-8 hours of sleep at night.
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
## 2022-04-04 ENCOUNTER — Encounter: Payer: Self-pay | Admitting: Family Medicine

## 2022-04-04 ENCOUNTER — Ambulatory Visit (INDEPENDENT_AMBULATORY_CARE_PROVIDER_SITE_OTHER): Payer: Medicare Other | Admitting: Family Medicine

## 2022-04-04 ENCOUNTER — Encounter: Payer: Self-pay | Admitting: Pulmonary Disease

## 2022-04-04 VITALS — BP 103/63 | HR 77 | Ht 66.0 in | Wt 134.5 lb

## 2022-04-04 DIAGNOSIS — G43109 Migraine with aura, not intractable, without status migrainosus: Secondary | ICD-10-CM | POA: Diagnosis not present

## 2022-04-04 DIAGNOSIS — G3184 Mild cognitive impairment, so stated: Secondary | ICD-10-CM | POA: Diagnosis not present

## 2022-04-04 DIAGNOSIS — G4752 REM sleep behavior disorder: Secondary | ICD-10-CM | POA: Diagnosis not present

## 2022-04-04 MED ORDER — DONEPEZIL HCL 10 MG PO TABS: 10.0000 mg | ORAL_TABLET | Freq: Every day | ORAL | 3 refills | Status: DC

## 2022-04-05 ENCOUNTER — Other Ambulatory Visit: Payer: Self-pay | Admitting: Internal Medicine

## 2022-04-05 DIAGNOSIS — R928 Other abnormal and inconclusive findings on diagnostic imaging of breast: Secondary | ICD-10-CM

## 2022-04-15 ENCOUNTER — Ambulatory Visit
Admission: RE | Admit: 2022-04-15 | Discharge: 2022-04-15 | Disposition: A | Discharge status: Home / Self Care | Payer: Medicare Other | Source: Ambulatory Visit | Attending: Internal Medicine | Admitting: Internal Medicine

## 2022-04-15 ENCOUNTER — Ambulatory Visit: Payer: Medicare Other

## 2022-04-15 DIAGNOSIS — R922 Inconclusive mammogram: Secondary | ICD-10-CM | POA: Diagnosis not present

## 2022-04-15 DIAGNOSIS — R928 Other abnormal and inconclusive findings on diagnostic imaging of breast: Secondary | ICD-10-CM

## 2022-07-02 DIAGNOSIS — H524 Presbyopia: Secondary | ICD-10-CM | POA: Diagnosis not present

## 2022-07-02 DIAGNOSIS — E785 Hyperlipidemia, unspecified: Secondary | ICD-10-CM | POA: Diagnosis not present

## 2022-07-02 DIAGNOSIS — I10 Essential (primary) hypertension: Secondary | ICD-10-CM | POA: Diagnosis not present

## 2022-07-02 DIAGNOSIS — M8580 Other specified disorders of bone density and structure, unspecified site: Secondary | ICD-10-CM | POA: Diagnosis not present

## 2022-07-09 DIAGNOSIS — Z1331 Encounter for screening for depression: Secondary | ICD-10-CM | POA: Diagnosis not present

## 2022-07-09 DIAGNOSIS — G3184 Mild cognitive impairment, so stated: Secondary | ICD-10-CM | POA: Diagnosis not present

## 2022-07-09 DIAGNOSIS — I10 Essential (primary) hypertension: Secondary | ICD-10-CM | POA: Diagnosis not present

## 2022-07-09 DIAGNOSIS — Z1339 Encounter for screening examination for other mental health and behavioral disorders: Secondary | ICD-10-CM | POA: Diagnosis not present

## 2022-07-09 DIAGNOSIS — I77810 Thoracic aortic ectasia: Secondary | ICD-10-CM | POA: Diagnosis not present

## 2022-07-09 DIAGNOSIS — I700 Atherosclerosis of aorta: Secondary | ICD-10-CM | POA: Diagnosis not present

## 2022-07-09 DIAGNOSIS — Z0000 Encounter for general adult medical examination without abnormal findings: Secondary | ICD-10-CM | POA: Diagnosis not present

## 2022-07-22 DIAGNOSIS — H524 Presbyopia: Secondary | ICD-10-CM | POA: Diagnosis not present

## 2022-08-06 DIAGNOSIS — Z79899 Other long term (current) drug therapy: Secondary | ICD-10-CM | POA: Diagnosis not present

## 2022-08-06 DIAGNOSIS — M8580 Other specified disorders of bone density and structure, unspecified site: Secondary | ICD-10-CM | POA: Diagnosis not present

## 2022-08-12 ENCOUNTER — Other Ambulatory Visit: Payer: Self-pay

## 2022-08-14 ENCOUNTER — Other Ambulatory Visit: Payer: Self-pay | Admitting: Neurology

## 2022-08-14 NOTE — Telephone Encounter (Signed)
Requested Prescriptions   Pending Prescriptions Disp Refills   clonazePAM (KLONOPIN) 0.5 MG tablet [Pharmacy Med Name: clonazepam 0.5 mg tablet] 15 tablet 5    Sig: TAKE 1/2 TABLET BY MOUTH AT BEDTIME   Last seen 04/04/22, next appt scheduled 04/10/23, routing to provider Dispenses   Dispensed Days Supply Quantity Provider Pharmacy  clonazepam 0.5 mg tablet 06/25/2022 30 15 each Dohmeier, Porfirio Mylar, MD [facility] - [facility]...  clonazepam 0.5 mg tablet 04/23/2022 30 15 each Dohmeier, Porfirio Mylar, MD [facility] - [facility]...  clonazepam 0.5 mg tablet 03/20/2022 30 15 each Dohmeier, Porfirio Mylar, MD [facility]...  clonazepam 0.5 mg tablet 02/13/2022 30 15 each Dohmeier, Porfirio Mylar, MD [facility] - [facility]...  clonazepam 0.25 mg disintegrating tablet 12/17/2021 30 30 each Lomax, Amy, NP [facility] - [facility]...  clonazepam 0.25 mg disintegrating tablet 09/24/2021 30 30 each Lomax, Amy, NP [facility] - G.Marland KitchenMarland Kitchen

## 2022-08-15 ENCOUNTER — Telehealth: Payer: Self-pay | Admitting: Pharmacy Technician

## 2022-08-15 ENCOUNTER — Other Ambulatory Visit: Payer: Self-pay

## 2022-08-15 NOTE — Telephone Encounter (Signed)
Auth Submission: NO AUTH NEEDED Site of care: Site of care: [facility] Payer: BCBS MEDICARE Medication & CPT/J Code(s) submitted: Reclast (Zolendronic acid) [id number] Route of submission (phone, fax, portal):  Phone # Fax # Auth type: Buy/Bill Units/visits requested:  Reference number:  Approval from: 08/15/22 to 03/25/23

## 2022-08-22 ENCOUNTER — Ambulatory Visit (INDEPENDENT_AMBULATORY_CARE_PROVIDER_SITE_OTHER): Payer: Medicare Other

## 2022-08-22 VITALS — BP 179/83 | HR 68 | Temp 98.3°F | Resp 18

## 2022-08-22 DIAGNOSIS — M810 Age-related osteoporosis without current pathological fracture: Secondary | ICD-10-CM | POA: Diagnosis not present

## 2022-08-22 MED ORDER — ZOLEDRONIC ACID 5 MG/100ML IV SOLN
5.0000 mg | Freq: Once | INTRAVENOUS | Status: AC
  Administered 2022-08-22: 5 mg via INTRAVENOUS
  Filled 2022-08-22: qty 100

## 2022-08-22 MED ORDER — SODIUM CHLORIDE 0.9 % IV SOLN: INTRAVENOUS | Status: DC

## 2022-08-22 MED ORDER — ACETAMINOPHEN 325 MG PO TABS
650.0000 mg | ORAL_TABLET | Freq: Once | ORAL | Status: AC
  Administered 2022-08-22: 650 mg via ORAL
  Filled 2022-08-22: qty 2

## 2022-08-22 MED ORDER — DIPHENHYDRAMINE HCL 25 MG PO CAPS
25.0000 mg | ORAL_CAPSULE | Freq: Once | ORAL | Status: AC
  Administered 2022-08-22: 25 mg via ORAL
  Filled 2022-08-22: qty 1

## 2022-08-22 NOTE — Progress Notes (Signed)
Diagnosis: Osteoporosis  Provider:  Chilton Greathouse MD  Procedure: IV Infusion  IV Type: Peripheral, IV Location: L Antecubital  Reclast (Zolendronic Acid), Dose: 5 mg  Infusion Start Time: 1534  Infusion Stop Time: 1609  Post Infusion IV Care: Peripheral IV Discontinued  Discharge: Condition: Good, Destination: Home . AVS Provided  Performed by:  Garnette Czech, RN

## 2022-10-21 ENCOUNTER — Telehealth: Payer: Self-pay | Admitting: Nurse Practitioner

## 2022-10-21 NOTE — Telephone Encounter (Signed)
Inbound call from patient daughter stating patient has has on going diarrhea for 3 weeks. Requesting to speak with a nurse. Please advise.   Thank you

## 2022-10-21 NOTE — Telephone Encounter (Signed)
Left message for pt daughter to call back  ?

## 2022-10-22 NOTE — Telephone Encounter (Signed)
Spoke with pts daughter and she states her mother has been having diarrhea for several months. Requesting pt be seen. Pt scheduled to see Hyacinth Meeker PA 10/23/22 at 9:30am. Pts daughter will let the pt know about the appt.

## 2022-10-23 ENCOUNTER — Ambulatory Visit (INDEPENDENT_AMBULATORY_CARE_PROVIDER_SITE_OTHER): Payer: Medicare Other | Admitting: Physician Assistant

## 2022-10-23 ENCOUNTER — Encounter: Payer: Self-pay | Admitting: Physician Assistant

## 2022-10-23 ENCOUNTER — Other Ambulatory Visit: Payer: Medicare Other

## 2022-10-23 VITALS — BP 130/80 | HR 67 | Ht 65.0 in | Wt 132.0 lb

## 2022-10-23 DIAGNOSIS — R197 Diarrhea, unspecified: Secondary | ICD-10-CM | POA: Diagnosis not present

## 2022-10-23 MED ORDER — LOPERAMIDE HCL 2 MG PO CAPS: ORAL_CAPSULE | ORAL | 2 refills | Status: DC

## 2022-10-23 NOTE — Patient Instructions (Signed)
_______________________________________________________  If your blood pressure at your visit was 140/90 or greater, please contact your primary care physician to follow up on this.  _______________________________________________________  If you are age 81 or older, your body mass index should be between 23-30. Your Body mass index is 21.97 kg/m. If this is out of the aforementioned range listed, please consider follow up with your Primary Care Provider.  If you are age 64 or younger, your body mass index should be between 19-25. Your Body mass index is 21.97 kg/m. If this is out of the aformentioned range listed, please consider follow up with your Primary Care Provider.   ________________________________________________________  The [facility] providers would like to encourage you to use [facility] to communicate with providers for non-urgent requests or questions.  Due to long hold times on the telephone, sending your provider a message by [facility] may be a faster and more efficient way to get a response.  Please allow 48 business hours for a response.  Please remember that this is for non-urgent requests.  _______________________________________________________  We have sent the following medications to your pharmacy for you to pick up at your convenience: Loperamide  Take over the counter Benefiber as follows: 1-2 tablespoons daily.  I appreciate the opportunity to care for you. Hyacinth Meeker PA-C

## 2022-10-23 NOTE — Progress Notes (Signed)
Chief Complaint: Diarrhea  HPI:    [name] is an 82 year old female with a past medical history as listed below including mild cognitive impairment, osteoarthritis, PVD and multiple others, known to Dr. Marina Goodell, who presents to clinic today for continued complaint of diarrhea.    02/27/2016 colonoscopy by Dr. Elnoria Howard with one 3 mm polyp removed in the transverse colon and diverticulosis in the sigmoid, ascending and cecum.  Colonoscopy somewhat difficult due to significant looping and tortuous colon.  Bowel prep excellent.  Path consistent with a tubular adenoma.    12/03/2021 patient seen in clinic by Alcide Evener, NP for diarrhea.  At that time had initially been seen by Dr. Marina Goodell on 10/02/2021 and given Metronidazole 250 mg 1 tab 3 times daily x 7 days.  GI path panel 10/09/2021 have been positive for enteropathogenic E. coli.  Her diarrhea abated after course of Metronidazole.  At that time presented for evaluation of sporadic diarrhea.  At that time suspected postinfectious IBS versus caffeine induced diarrhea versus side effect from Aricept.  Told to reduce coffee intake.  Use 1 tab of Imodium twice daily as needed.  Discussed adding possible Lactaid and Benefiber.  Discussed colonoscopy if diarrhea persisted or worsen.    Today, the patient tells me that her diarrhea had completely gone away between last visit and about a month and a half ago, but then she started with at least 2-3 loose stools a day and feels like she has almost constant rectal pressure that makes it feel like she has to go to the bathroom even when she does not.  Her stomach is "uncomfortable and rumbly", but no overt abdominal pain or spasms.  Very seldom she will have a formed stool but most of the time it is loose or watery.  Again this has been happening over the past 1-1/2 months.  She has not tried anything for this other than a probiotic gummy which she took for a week and it did not help.    Of note patient's blood  pressure is little high today but she has not taken her Amlodipine.    Denies fever, chills, weight loss, nausea, vomiting, symptoms that awaken her from sleep or blood in her stool.  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  amLODipine (NORVASC) 5 MG tablet Take 1 tablet (5 mg total) by mouth daily. 30 tablet 0   CALCIUM-MAGNESIUM-VITAMIN D PO Take 1 tablet by mouth daily.     clonazePAM (KLONOPIN) 0.5 MG tablet TAKE 1/2 TABLET BY MOUTH AT BEDTIME 15 tablet 5   donepezil (ARICEPT) 10 MG tablet Take 1 tablet (10 mg total) by mouth at bedtime. 90 tablet 3   rosuvastatin (CRESTOR) 5 MG tablet Take one tablet by mouth three times a week. 36 tablet 6   Current Facility-Administered Medications  Medication Dose Route Frequency Provider Last Rate Last Admin    azelastine (ASTELIN) 0.1 % nasal spray 1 spray  1 spray Each Nare BID Drema Halon, MD        Allergies as of 10/23/2022 - Review Complete 04/04/2022  Allergen Reaction Noted   Caine-1 [lidocaine] Rash and Other (See Comments) 04/03/2013    Family History  Problem Relation Age of Onset   Diabetes Mother    Heart disease Mother    Cancer Mother        type unknown   Bone cancer Father    Diabetes Sister    Lung disease Sister    COPD Sister    Heart attack Paternal Grandfather    Down syndrome Daughter    Dementia Daughter    Other Daughter        colon surgery    Social History   Socioeconomic History   Marital status: Divorced    Spouse name: Not on file   Number of children: 3   Years of education: 16   Highest education level: Not on file  Occupational History   Occupation: retired  Tobacco Use   Smoking status: Some Days    Current packs/day: 0.50    Types: Cigarettes   Smokeless tobacco: Never  Vaping Use   Vaping status: Never Used  Substance and Sexual Activity   Alcohol use: Yes    Alcohol/week: 5.0 standard drinks of alcohol    Types: 5 Glasses of wine per week    Comment: 1 glass of wine nightly   Drug use: No   Sexual activity: Not on file  Other Topics Concern   Not on file  Social History Narrative   Drinks 3-4 cups of caffeine daily.   Social Determinants of Health   Financial Resource Strain: Not on file  Food Insecurity: Not on file  Transportation Needs: Not on file  Physical Activity: Not on file  Stress: Not on file  Social Connections: Unknown (08/07/2021)   [facility]   Social Network    Social Network: Not on file  Intimate Partner Violence: Unknown (06/28/2021)   Received from [facility]   HITS    Physically Hurt: Not on file    Insult or Talk Down To: Not on file    Threaten Physical Harm: Not on file    Scream or Curse: Not on file    Review of Systems:    Constitutional: No weight loss,  fever or chills Cardiovascular: No chest pain Respiratory: No SOB  Gastrointestinal: See HPI and otherwise negative   Physical Exam:  Vital signs: BP (!) 160/80   Pulse 67   Ht 5\' 5"  (1.651 m)   Wt 132 lb (59.9 kg)   BMI 21.97 kg/m    Constitutional:   Pleasant elderly Caucasian female appears to be in NAD, Well developed, Well nourished, alert and cooperative Respiratory: Respirations even and unlabored. Lungs clear to auscultation  bilaterally.   No wheezes, crackles, or rhonchi.  Cardiovascular: Normal S1, S2. No MRG. Regular rate and rhythm. No peripheral edema, cyanosis or pallor.  Gastrointestinal:  Soft, nondistended, nontender. No rebound or guarding. Normal bowel sounds. No appreciable masses or hepatomegaly. Rectal:  Not performed.  Psychiatric: Demonstrates good judgement and reason without abnormal affect or behaviors.  RELEVANT LABS AND IMAGING: CBC    Component Value Date/Time   WBC 5.5 10/02/2021 1643   RBC 4.05 10/02/2021 1643   HGB 13.4 10/02/2021 1643   HCT 39.7 10/02/2021 1643   PLT 232.0 10/02/2021 1643   MCV 97.9 10/02/2021 1643   MCH 34.6 (H) 10/05/2018 2056   MCHC 33.8 10/02/2021 1643   RDW 12.4 10/02/2021 1643   LYMPHSABS 2.2 10/02/2021 1643   MONOABS 0.3 10/02/2021 1643   EOSABS 0.1 10/02/2021 1643   BASOSABS 0.1 10/02/2021 1643    CMP     Component Value Date/Time   NA 134 (L) 10/02/2021 1643   NA 139 11/18/2018 1418   K 4.2 10/02/2021 1643   CL 100 10/02/2021 1643   CO2 28 10/02/2021 1643   GLUCOSE 90 10/02/2021 1643   BUN 15 10/02/2021 1643   BUN 15 11/18/2018 1418   CREATININE 0.94 10/02/2021 1643   CALCIUM 9.0 10/02/2021 1643   PROT 6.7 10/02/2021 1643   PROT 7.1 06/11/2016 1657   ALBUMIN 4.5 10/02/2021 1643   ALBUMIN 4.8 06/11/2016 1657   AST 17 10/02/2021 1643   ALT 14 10/02/2021 1643   ALKPHOS 65 10/02/2021 1643   BILITOT 0.4 10/02/2021 1643   BILITOT 0.4 06/11/2016 1657   GFRNONAA 62 11/18/2018 1418   GFRAA 71 11/18/2018  1418    Assessment: 1.  Diarrhea: Previously diagnosed with E. coli in August of last year, treated with Flagyl, continue with diarrhea in September thought related to postinfectious IBS or Aricept or caffeine intake, diarrhea had gone away per patient until about a month and a half ago, now with 2-3 loose or watery stools on a seldom formed stool daily, remains uncomfortable and rumbly in her stomach, has not tried anything over-the-counter; consider infectious versus IBS versus other  Plan: 1.  Patient would like to avoid colonoscopy if possible. 2.  Repeat stool studies including GI path panel, fecal calprotectin and fecal lactoferrin. 3.  Discussed taking Benefiber 1 to 2 tablespoons daily 4.  Also discussed Imodium, prescribed Loperamide 1 tab every 6 hours as needed for diarrhea #30 with 1 refill.  Discussed trying this on a daily basis to decrease fecal urgency and frequency. 5.  Patient to follow in clinic per recommendations after labs above.  Kara Meeker, PA-C [facility] 10/23/2022, 9:33 AM  Cc: Melida Quitter, MD

## 2022-10-23 NOTE — Progress Notes (Signed)
Reviewed. Thanks for seeing Kara Tapia. You could consider another empiric course of metronidazole. Thanks, JP

## 2022-10-29 ENCOUNTER — Other Ambulatory Visit: Payer: Medicare Other

## 2022-10-29 DIAGNOSIS — R197 Diarrhea, unspecified: Secondary | ICD-10-CM | POA: Diagnosis not present

## 2022-10-31 ENCOUNTER — Telehealth: Payer: Self-pay

## 2022-10-31 NOTE — Telephone Encounter (Signed)
Received call from [facility] with call report regarding GI Path panel. See result.

## 2022-11-01 NOTE — Telephone Encounter (Signed)
This has been addressed. See 8/6 stool study result note.

## 2022-12-03 ENCOUNTER — Telehealth: Payer: Self-pay | Admitting: Physician Assistant

## 2022-12-03 MED ORDER — CIPROFLOXACIN HCL 500 MG PO TABS: 500.0000 mg | ORAL_TABLET | Freq: Two times a day (BID) | ORAL | 0 refills | Status: AC

## 2022-12-03 NOTE — Telephone Encounter (Signed)
Unk Lightning, [place]  YouJust now (3:29 PM)   Yes, let's try treating with cipro. Thanks-JLL  Ciprofloxacin 500 BID x5 days #10.  Lm on mobile vm for patient to return call. Cipro sent to [facility].

## 2022-12-03 NOTE — Telephone Encounter (Signed)
Kara Tapia please see note below. Pt previously tested positive for Yersinia. Pt was doing well with Imodium and Fiber so she previously declined Cipro that was offered. Seems like diarrhea has returned. Please advise, thanks.

## 2022-12-03 NOTE — Telephone Encounter (Signed)
Inbound call from patient requesting either a call or mychart message to discuss next steps for her. Patient stated she is still having issues with diarrhea. Please advise.

## 2022-12-04 ENCOUNTER — Other Ambulatory Visit: Payer: Self-pay

## 2022-12-04 NOTE — Telephone Encounter (Signed)
Called and spoke with patient regarding recommendations. Pt is aware that RX was sent to her pharmacy yesterday. Pt will call and update [place] once she has completed Cipro prescription. Pt verbalized understanding and had no concerns at the end of the call.

## 2022-12-09 ENCOUNTER — Other Ambulatory Visit: Payer: Self-pay

## 2022-12-23 ENCOUNTER — Telehealth: Payer: Self-pay | Admitting: Physician Assistant

## 2022-12-23 DIAGNOSIS — M545 Low back pain, unspecified: Secondary | ICD-10-CM | POA: Diagnosis not present

## 2022-12-23 NOTE — Telephone Encounter (Signed)
Inbound call from patient stating she saw her orthopaedic provider today and was prescribed a new medication. Provider advised for patient to make sure it is okay to take new medication with her gastroenterologist provider. Patient requesting a call back to discuss further. Please advise, thank you.

## 2022-12-23 NOTE — Telephone Encounter (Signed)
Called patient to inform her that it's ok to take the Prednisone medication. Patient appreciative, understood and agreed.

## 2022-12-23 NOTE — Telephone Encounter (Signed)
Patient states she saw her Orthopedic provider today and the provider wanted to make sure the medication the patient is presently taking will be ok with her GI provider. Patient states she has recently completed her ABX therapy Cipro of 2 weeks and is now taking Prednisone 5 mg; 3 pills (15 mg)  after lunch and 3 pills after supper; (day 2) 3 pills after lunch and 2 pills after supper; ( day 3) 2 pills after lunch and 2 pills after supper; (day4) 2 pills after lunch and 1 pill after supper; ( day 5) 2 pills after lunch; (day 6)1 pill after breakfast. Please advise.

## 2023-01-02 DIAGNOSIS — M5416 Radiculopathy, lumbar region: Secondary | ICD-10-CM | POA: Diagnosis not present

## 2023-01-02 DIAGNOSIS — M545 Low back pain, unspecified: Secondary | ICD-10-CM | POA: Diagnosis not present

## 2023-01-09 ENCOUNTER — Ambulatory Visit: Payer: Medicare Other | Admitting: Dermatology

## 2023-01-09 ENCOUNTER — Encounter: Payer: Self-pay | Admitting: Dermatology

## 2023-01-09 VITALS — BP 124/76 | HR 71

## 2023-01-09 DIAGNOSIS — Z1283 Encounter for screening for malignant neoplasm of skin: Secondary | ICD-10-CM

## 2023-01-09 DIAGNOSIS — L821 Other seborrheic keratosis: Secondary | ICD-10-CM

## 2023-01-09 DIAGNOSIS — Z0000 Encounter for general adult medical examination without abnormal findings: Secondary | ICD-10-CM | POA: Insufficient documentation

## 2023-01-09 DIAGNOSIS — L719 Rosacea, unspecified: Secondary | ICD-10-CM | POA: Diagnosis not present

## 2023-01-09 DIAGNOSIS — K219 Gastro-esophageal reflux disease without esophagitis: Secondary | ICD-10-CM | POA: Insufficient documentation

## 2023-01-09 DIAGNOSIS — L814 Other melanin hyperpigmentation: Secondary | ICD-10-CM | POA: Diagnosis not present

## 2023-01-09 DIAGNOSIS — D492 Neoplasm of unspecified behavior of bone, soft tissue, and skin: Secondary | ICD-10-CM

## 2023-01-09 DIAGNOSIS — L578 Other skin changes due to chronic exposure to nonionizing radiation: Secondary | ICD-10-CM

## 2023-01-09 DIAGNOSIS — R030 Elevated blood-pressure reading, without diagnosis of hypertension: Secondary | ICD-10-CM | POA: Insufficient documentation

## 2023-01-09 DIAGNOSIS — D1801 Hemangioma of skin and subcutaneous tissue: Secondary | ICD-10-CM | POA: Diagnosis not present

## 2023-01-09 DIAGNOSIS — R413 Other amnesia: Secondary | ICD-10-CM | POA: Insufficient documentation

## 2023-01-09 DIAGNOSIS — D229 Melanocytic nevi, unspecified: Secondary | ICD-10-CM

## 2023-01-09 DIAGNOSIS — R319 Hematuria, unspecified: Secondary | ICD-10-CM | POA: Insufficient documentation

## 2023-01-09 DIAGNOSIS — R42 Dizziness and giddiness: Secondary | ICD-10-CM | POA: Insufficient documentation

## 2023-01-09 DIAGNOSIS — W908XXA Exposure to other nonionizing radiation, initial encounter: Secondary | ICD-10-CM

## 2023-01-09 DIAGNOSIS — E782 Mixed hyperlipidemia: Secondary | ICD-10-CM | POA: Insufficient documentation

## 2023-01-09 DIAGNOSIS — J449 Chronic obstructive pulmonary disease, unspecified: Secondary | ICD-10-CM | POA: Insufficient documentation

## 2023-01-09 DIAGNOSIS — R87619 Unspecified abnormal cytological findings in specimens from cervix uteri: Secondary | ICD-10-CM | POA: Insufficient documentation

## 2023-01-09 NOTE — Progress Notes (Signed)
   New Patient Visit   Subjective  Kara Tapia is a 82 y.o. female who presents for the following:  Total Body Skin Exam (TBSE)  Patient present today for new patient visit for TBSE.The patient denies she has spots, moles and lesions to be evaluated, some may be new or changing. Patient has previously been treated by a dermatologist.Patient reports she has hx of bxs but no skin cancer. Patient denies family history of skin cancers. Patient reports throughout her lifetime has had moderate sun exposure. Currently, patient reports if she has excessive sun exposure, she does apply sunscreen and/or wears protective coverings.  The following portions of the chart were reviewed this encounter and updated as appropriate: medications, allergies, medical history  Review of Systems:  No other skin or systemic complaints except as noted in HPI or Assessment and Plan.  Objective  Well appearing patient in no apparent distress; mood and affect are within normal limits.  A full examination was performed including scalp, head, eyes, ears, nose, lips, neck, chest, axillae, abdomen, back, buttocks, bilateral upper extremities, bilateral lower extremities, hands, feet, fingers, toes, fingernails, and toenails. All findings within normal limits unless otherwise noted below.   Relevant exam findings are noted in the Assessment and Plan.   Assessment & Plan   LENTIGINES, SEBORRHEIC KERATOSES, HEMANGIOMAS - Benign normal skin lesions - Benign-appearing - Call for any changes  MELANOCYTIC NEVI - Tan-brown and/or pink-flesh-colored symmetric macules and papules - Benign appearing on exam today - Observation - Call clinic for new or changing moles - Recommend daily use of broad spectrum spf 30+ sunscreen to sun-exposed areas.   ACTINIC DAMAGE - Chronic condition, secondary to cumulative UV/sun exposure - diffuse scaly erythematous macules with underlying dyspigmentation - Recommend daily broad spectrum  sunscreen SPF 30+ to sun-exposed areas, reapply every 2 hours as needed.  - Staying in the shade or wearing long sleeves, sun glasses (UVA+UVB protection) and wide brim hats (4-inch brim around the entire circumference of the hat) are also recommended for sun protection.  - Call for new or changing lesions.  SKIN CANCER SCREENING PERFORMED TODAY  Telangiectatic Rosacea- acute condition, not at treatment goal Mid face erythema with telangiectasias +/- scattered inflammatory papules. I counseled the patient regarding the following: Rosacea is a chronic skin condition that we can treat but not cure Skin care: Patient instructed to wear broad spectrum sunscreen. Moisturizers with green tints can hide redness. Expectations: Rosacea is chronic. Flushing and pimples can be triggered by: alcohol, stress, exercise, hot temperatures or spicy foods, wind and sun exposure. Telangiectasias can be improved with laser. Contact office if: Rosacea worsens or fails to improve despite months of treatment; patient develops nodules or cysts.  I recommended the following:  PDL Laser  Neoplasm of Skin- Right anterior commissure- Ddx Milia vs syringoma vs other - Discussed opt of bx today vs monitoring for changes, patient prefers to monitor for changes and follow up in 2 months  Return in about 2 months (around 03/11/2023) for Face Lesion F/U.   Documentation: I have reviewed the above documentation for accuracy and completeness, and I agree with the above.  I, Nell Range, am acting as scribe for Gwenith Daily, MD.  Gwenith Daily, MD

## 2023-01-09 NOTE — Patient Instructions (Addendum)
The Pulsed Dye Laser, or PDL uses a concentrated beam of light that targets blood vessels in the skin. The light is converted into heat, destroying the blood vessel while leaving the surrounding skin undamaged. The laser uses yellow light, which is very safe. At [facility], we own the [facility]. Another version of the PDL, the Candela Scleroplus, is located at the [facility] in the operating room in order to facilitate treatment of children who need anesthesia.  About the Treatment PDL treatments usually take only a few minutes and are performed during an outpatient clinic visit. No anesthesia is required, as the machine produces a cold spray just before the laser pulse, diminishing the sensation of pain. Most patients with usually need between 1-3 treatments. Patients with port wine stains, hemangiomas and extensive rosacea may need more treatments. Improving the appearance of red scars, hypertrophic scars or keloids may take a variable number of treatments. As with any other treatment, incomplete response or recurrence may occur.  Above is a Therapist, music Dermatology patient had multiple cherry angiomas removed with one pulsed dye laser treatment. Above is a Therapist, music Medicine Dermatology patient who had multiple cherry angiomas removed with one pulsed dye laser treatment.  Side Effects Side effects are generally minimal. The most common side effect is bruising. With the V-Beam bruising can be minimized or even eliminated. If it should occur, bruising is most pronounced in the first few days and usually clears within 3-10 days. Less common side effects include pigmentary changes. Sunblock for one month before treatment is recommended as tanned skin blocks the laser light and results in a higher chance of side effects. Scarring is extremely rare with this laser.  Aftercare Skin care after the procedure is straightforward. A moisturizer, such as Aquaphor Ointment or Vaseline Jelly, applied  2-3 times per day will help protect the skin and speed healing. Wynelle Link protection will help minimize the chance of pigmentary changes. Makeup can be used starting on day two. If any crust forms, patients should not pick or try to remove it. Any bruising fades relatively quickly over a 3-10 day period  Important Information   Due to recent changes in healthcare laws, you may see results of your pathology and/or laboratory studies on MyChart before the doctors have had a chance to review them. We understand that in some cases there may be results that are confusing or concerning to you. Please understand that not all results are received at the same time and often the doctors may need to interpret multiple results in order to provide you with the best plan of care or course of treatment. Therefore, we ask that you please give [place] 2 business days to thoroughly review all your results before contacting the office for clarification. Should we see a critical lab result, you will be contacted sooner.     If You Need Anything After Your Visit   If you have any questions or concerns for your doctor, please call our main line at [phone]. If no one answers, please leave a voicemail as directed and we will return your call as soon as possible. Messages left after 4 pm will be answered the following business day.    You may also send [place] a message via MyChart. We typically respond to MyChart messages within 1-2 business days.  For prescription refills, please ask your pharmacy to contact our office. Our fax number is [phone].  If you have an urgent issue when the clinic is closed that  cannot wait until the next business day, you can page your doctor at the number below.     Please note that while we do our best to be available for urgent issues outside of office hours, we are not available 24/7.    If you have an urgent issue and are unable to reach [place], you may choose to seek medical care at your doctor's  office, retail clinic, urgent care center, or emergency room.   If you have a medical emergency, please immediately call 911 or go to the emergency department. In the event of inclement weather, please call our main line at [phone] for an update on the status of any delays or closures.  Dermatology Medication Tips: Please keep the boxes that topical medications come in in order to help keep track of the instructions about where and how to use these. Pharmacies typically print the medication instructions only on the boxes and not directly on the medication tubes.   If your medication is too expensive, please contact our office at [phone] or send [place] a message through MyChart.    We are unable to tell what your co-pay for medications will be in advance as this is different depending on your insurance coverage. However, we may be able to find a substitute medication at lower cost or fill out paperwork to get insurance to cover a needed medication.    If a prior authorization is required to get your medication covered by your insurance company, please allow [place] 1-2 business days to complete this process.   Drug prices often vary depending on where the prescription is filled and some pharmacies may offer cheaper prices.   The website www.goodrx.com contains coupons for medications through different pharmacies. The prices here do not account for what the cost may be with help from insurance (it may be cheaper with your insurance), but the website can give you the price if you did not use any insurance.  - You can print the associated coupon and take it with your prescription to the pharmacy.  - You may also stop by our office during regular business hours and pick up a GoodRx coupon card.  - If you need your prescription sent electronically to a different pharmacy, notify our office through [facility] or by phone at [phone]

## 2023-01-10 DIAGNOSIS — Z23 Encounter for immunization: Secondary | ICD-10-CM | POA: Diagnosis not present

## 2023-01-10 DIAGNOSIS — I10 Essential (primary) hypertension: Secondary | ICD-10-CM | POA: Diagnosis not present

## 2023-01-10 DIAGNOSIS — I700 Atherosclerosis of aorta: Secondary | ICD-10-CM | POA: Diagnosis not present

## 2023-01-17 DIAGNOSIS — M5451 Vertebrogenic low back pain: Secondary | ICD-10-CM | POA: Diagnosis not present

## 2023-01-17 DIAGNOSIS — M5416 Radiculopathy, lumbar region: Secondary | ICD-10-CM | POA: Diagnosis not present

## 2023-01-21 DIAGNOSIS — M5416 Radiculopathy, lumbar region: Secondary | ICD-10-CM | POA: Diagnosis not present

## 2023-01-21 DIAGNOSIS — M5451 Vertebrogenic low back pain: Secondary | ICD-10-CM | POA: Diagnosis not present

## 2023-01-24 DIAGNOSIS — M5451 Vertebrogenic low back pain: Secondary | ICD-10-CM | POA: Diagnosis not present

## 2023-01-24 DIAGNOSIS — M5416 Radiculopathy, lumbar region: Secondary | ICD-10-CM | POA: Diagnosis not present

## 2023-01-27 DIAGNOSIS — M5416 Radiculopathy, lumbar region: Secondary | ICD-10-CM | POA: Diagnosis not present

## 2023-01-27 DIAGNOSIS — M5451 Vertebrogenic low back pain: Secondary | ICD-10-CM | POA: Diagnosis not present

## 2023-01-31 DIAGNOSIS — M5416 Radiculopathy, lumbar region: Secondary | ICD-10-CM | POA: Diagnosis not present

## 2023-01-31 DIAGNOSIS — M5451 Vertebrogenic low back pain: Secondary | ICD-10-CM | POA: Diagnosis not present

## 2023-02-03 DIAGNOSIS — M5451 Vertebrogenic low back pain: Secondary | ICD-10-CM | POA: Diagnosis not present

## 2023-02-03 DIAGNOSIS — M5416 Radiculopathy, lumbar region: Secondary | ICD-10-CM | POA: Diagnosis not present

## 2023-02-07 DIAGNOSIS — M5451 Vertebrogenic low back pain: Secondary | ICD-10-CM | POA: Diagnosis not present

## 2023-02-07 DIAGNOSIS — M5416 Radiculopathy, lumbar region: Secondary | ICD-10-CM | POA: Diagnosis not present

## 2023-02-10 DIAGNOSIS — M5451 Vertebrogenic low back pain: Secondary | ICD-10-CM | POA: Diagnosis not present

## 2023-02-10 DIAGNOSIS — M5416 Radiculopathy, lumbar region: Secondary | ICD-10-CM | POA: Diagnosis not present

## 2023-02-13 ENCOUNTER — Telehealth: Payer: Self-pay | Admitting: Physician Assistant

## 2023-02-13 DIAGNOSIS — I700 Atherosclerosis of aorta: Secondary | ICD-10-CM | POA: Diagnosis not present

## 2023-02-13 NOTE — Telephone Encounter (Signed)
PT is calling about her previous diarrhea issues. She was given medications for relief in September and they have not helped. She is seeking other options. Please advise.

## 2023-02-14 NOTE — Telephone Encounter (Signed)
Patient called and stated she was still experiencing diarrhea. And requested a call back. Please advise.

## 2023-02-17 DIAGNOSIS — M533 Sacrococcygeal disorders, not elsewhere classified: Secondary | ICD-10-CM | POA: Diagnosis not present

## 2023-02-17 DIAGNOSIS — M5416 Radiculopathy, lumbar region: Secondary | ICD-10-CM | POA: Diagnosis not present

## 2023-02-17 NOTE — Telephone Encounter (Signed)
Inbound call from patient requesting a urgent call to discuss previous notes. Please advise, thank you.

## 2023-02-17 NOTE — Telephone Encounter (Signed)
Pt states she is continuing to have diarrhea. She is taking her fiber and Imodium but the diarrhea persists. Pt scheduled to see Mike Gip PA 02/25/23 AT 2PM, Pt aware of appt.

## 2023-02-25 ENCOUNTER — Encounter: Payer: Self-pay | Admitting: Physician Assistant

## 2023-02-25 ENCOUNTER — Ambulatory Visit (INDEPENDENT_AMBULATORY_CARE_PROVIDER_SITE_OTHER): Payer: Medicare Other | Admitting: Physician Assistant

## 2023-02-25 VITALS — BP 116/80 | HR 72 | Ht 65.0 in | Wt 130.0 lb

## 2023-02-25 DIAGNOSIS — R197 Diarrhea, unspecified: Secondary | ICD-10-CM

## 2023-02-25 DIAGNOSIS — R140 Abdominal distension (gaseous): Secondary | ICD-10-CM | POA: Diagnosis not present

## 2023-02-25 DIAGNOSIS — K529 Noninfective gastroenteritis and colitis, unspecified: Secondary | ICD-10-CM | POA: Diagnosis not present

## 2023-02-25 DIAGNOSIS — Z8619 Personal history of other infectious and parasitic diseases: Secondary | ICD-10-CM | POA: Diagnosis not present

## 2023-02-25 DIAGNOSIS — Z860101 Personal history of adenomatous and serrated colon polyps: Secondary | ICD-10-CM

## 2023-02-25 DIAGNOSIS — R143 Flatulence: Secondary | ICD-10-CM

## 2023-02-25 NOTE — Progress Notes (Signed)
Subjective:    Patient ID: Kara Tapia, female    DOB: [birth year], 82 y.o.   MRN: [id number]  HPI  Kara Tapia is a pleasant 82 year old female, established with Dr. Marina Goodell, who was last seen in the office in July 2024 by Hyacinth Meeker, PA-C for diarrhea. She comes back in today for follow-up of diarrhea. She had last had colonoscopy in 2017 per Dr. Elnoria Howard and was noted to have a tortuous colon at that time and one 3 mm tubular adenomatous polyp. She had been seen here in 2023 with diarrhea and stool cultures at that time were positive for enterotoxigenic E. coli. When seen in July 2024 again with more acute diarrhea at that time she had GI path panel done which was positive for Yersinia. She also had a lactoferrin done which was negative and fecal calprotectin was borderline elevated at 69.  She says that the acute diarrheal episode resolved before she was given a prescription for Cipro back in July and she did not wind up taking the Cipro. Ever since then she has had milder symptoms but continues to have episodes of diarrhea about 4-5 times per week and on those days will have 1-2 loose bowel movements.  In between those times she does not have any bowel movements.  She has not noticed any melena or hematochezia.  She denies any abdominal pain but says that her abdomen always seems to be making a lot of noise and she does have problems with gas and bloating.  Appetite has been okay, weight has been stable.  She is not aware of any specific food triggers. She recently had a back injury which required her to take a lot of muscle relaxers etc. and she is just completed 6 weeks of physical therapy and says that her back is very much improved and she is no longer requiring any of those meds.  Wearing that time she also says that this any diarrhea seem to be a bit worse and is now back to her baseline. Other medical problems include migraines, mild cognitive impairment-on Aricept, peripheral vascular  disease, GERD, COPD and hypertension.  Review of Systems Pertinent positive and negative review of systems were noted in the above HPI section.  All other review of systems was otherwise negative.   Outpatient Encounter Medications as of 02/25/2023  Medication Sig   amLODipine (NORVASC) 2.5 MG tablet Take 2.5 mg by mouth daily.   CALCIUM-MAGNESIUM-VITAMIN D PO Take 1 tablet by mouth daily.   clonazePAM (KLONOPIN) 0.5 MG tablet TAKE 1/2 TABLET BY MOUTH AT BEDTIME   donepezil (ARICEPT) 10 MG tablet Take 1 tablet (10 mg total) by mouth at bedtime.   rosuvastatin (CRESTOR) 10 MG tablet Take 10 mg by mouth at bedtime.   Wheat Dextrin (BENEFIBER PO) Take 1 Capful by mouth daily.   loperamide (IMODIUM) 2 MG capsule Take one tablet every 6 hours as needed for diarrhea. (Patient not taking: Reported on 02/25/2023)   [DISCONTINUED] hydrOXYzine (VISTARIL) 25 MG capsule Take by mouth.   [DISCONTINUED] methocarbamol (ROBAXIN) 500 MG tablet Take 500 mg by mouth 2 (two) times daily.   [DISCONTINUED] tiZANidine (ZANAFLEX) 2 MG tablet Take 2 mg by mouth 2 (two) times daily as needed.   Facility-Administered Encounter Medications as of 02/25/2023  Medication   azelastine (ASTELIN) 0.1 % nasal spray 1 spray   Allergies  Allergen Reactions   Caine-1 [Lidocaine] Rash and Other (See Comments)    Affects her BP   Patient Active Problem  List   Diagnosis Date Noted   Mixed hyperlipidemia 01/09/2023   Mild memory disturbance 01/09/2023   Medicare annual wellness visit, subsequent 01/09/2023   Gastroesophageal reflux disease without esophagitis 01/09/2023   Elevated blood pressure reading 01/09/2023   Dizziness 01/09/2023   COPD mixed type (HCC) 01/09/2023   Hematuria 01/09/2023   Abnormal cervical Papanicolaou smear 01/09/2023   Senile osteoporosis 08/03/2021   Pain in joint of right shoulder 04/29/2019   Syncope 01/12/2019   Hypertension 01/12/2019   Lisping 12/02/2017   Sleep behavior disorder, REM  06/04/2017   MCI (mild cognitive impairment) 06/04/2017   Insomnia 07/06/2015   Nightmares REM-sleep type 07/06/2015   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   Acquired trigger finger 06/14/2015   Parasomnia, organic 10/04/2014   Migraine with status migrainosus 08/09/2014   Scotoma 08/09/2014   Intractable migraine with aura with status migrainosus 08/09/2014   REM sleep behavior disorder 08/09/2014   Social History   Socioeconomic History   Marital status: Divorced    Spouse name: Not on file   Number of children: 3   Years of education: 16   Highest education level: Not on file  Occupational History   Occupation: retired  Tobacco Use   Smoking status: Every Day    Current packs/day: 0.50    Types: Cigarettes    Passive exposure: Never   Smokeless tobacco: Never  Vaping Use   Vaping status: Never Used  Substance and Sexual Activity   Alcohol use: Yes    Alcohol/week: 5.0 standard drinks of alcohol    Types: 5 Glasses of wine per week    Comment: 1 glass of wine nightly   Drug use: No   Sexual activity: Not on file  Other Topics Concern   Not on file  Social History Narrative   Drinks 3-4 cups of caffeine daily.   Social Determinants of Health   Financial Resource Strain: Not on file  Food Insecurity: Not on file  Transportation Needs: Not on file  Physical Activity: Not on file  Stress: Not on file  Social Connections: Unknown (08/07/2021)   Received from [facility], [facility]   Social Network    Social Network: Not on file  Intimate Partner Violence: Unknown (06/28/2021)   Received from [facility], [facility]   HITS    Physically Hurt: Not on file    Insult or Talk Down To: Not on file    Threaten Physical Harm: Not on file    Scream or Curse: Not on file    Ms. Delahoz's family history includes Bone cancer in her father; COPD in her sister; Cancer in her mother; Dementia in her  daughter; Diabetes in her mother and sister; Down syndrome in her daughter; Heart attack in her paternal grandfather; Heart disease in her mother; Lung disease in her sister; Other in her daughter.      Objective:    Vitals:   02/25/23 1358  BP: 116/80  Pulse: 72  SpO2: 97%    Physical Exam. Well-developed well-nourished elderly white female in no acute distress.  Pleasant, height, Weight, 130 BMI 21.6  HEENT; nontraumatic normocephalic, EOMI, PE R LA, sclera anicteric. Oropharynx; not examined today Neck; supple, no JVD Cardiovascular; regular rate and rhythm with S1-S2, no murmur rub or gallop Pulmonary; Clear bilaterally Abdomen; soft, nontender, nondistended, no palpable mass or hepatosplenomegaly, bowel sounds are hyperactive Rectal; not done today Skin; benign exam, no jaundice rash  or appreciable lesions Extremities; no clubbing cyanosis or edema skin warm and dry Neuro/Psych; alert and oriented x4, grossly nonfocal mood and affect appropriate        Assessment & Plan:   #40 82 year old white female with history of fairly chronic diarrhea. She did have enteropathogenic E. coli in 2023 and then GI path panel July 2024 was positive for Yersinia. In between those more acute episodes she has had chronic mild diarrhea with 1-2 bowel movements per day of loose stool occurring 4-5 times per week.  Also complains of gas and bloating.  Etiology of the more chronic diarrhea at this time is not entirely clear this may be postinfectious IBS, cannot rule out microscopic colitis his last colonoscopy was done in 2017  Consider SIBO  Consider component secondary to medication/Aricept  #2 history of adenomatous colon polyp-no further surveillance due to age #3 peripheral vascular disease 4.  Mild cognitive impairment-on Aricept 5.  COPD 6.  Hypertension 7.  History of migraines  Plan; will proceed with breath testing to rule out SIBO, treat with Xifaxan if positive Patient will  start a trial of Imodium 1 p.o. every morning or q. OD. Is willing to have a colonoscopy if necessary but would like to avoid Further recommendations pending results of breath testing, and her response to Imodium.  If Imodium is not significantly helpful and breath testing is negative may still give her a course of empiric Xifaxan or metronidazole, and then also explore low FODMAP diet.   Dion Sibal S Lari Linson PA-C 02/25/2023   Cc: Melida Quitter, MD

## 2023-02-25 NOTE — Progress Notes (Signed)
Thank you for seeing Kara Tapia

## 2023-02-25 NOTE — Patient Instructions (Signed)
Start Imodium OTC every other day to daily for diarrhea.   You have been given a testing kit to check for small intestine bacterial overgrowth (SIBO) which is completed by a company named [facility]. Make sure to return your test in the mail using the return mailing label given to you along with the kit. The test order, your demographic and insurance information have all already been sent to the company. [facility] will collect an upfront charge of $99.74 for commercial insurance plans and $209.74 if you are paying cash. Make sure to discuss with [facility] PRIOR to having the test to see if they have gotten information from your insurance company as to how much your testing will cost out of pocket, if any. Please contact [facility] at phone number [phone] to get instructions regarding how to perform the test as our office is unable to give specific testing instructions.  _______________________________________________________  If your blood pressure at your visit was 140/90 or greater, please contact your primary care physician to follow up on this.  _______________________________________________________  If you are age 83 or older, your body mass index should be between 23-30. Your Body mass index is 21.63 kg/m. If this is out of the aforementioned range listed, please consider follow up with your Primary Care Provider.  If you are age 11 or younger, your body mass index should be between 19-25. Your Body mass index is 21.63 kg/m. If this is out of the aformentioned range listed, please consider follow up with your Primary Care Provider.   ________________________________________________________  The [facility] providers would like to encourage you to use [facility] to communicate with providers for non-urgent requests or questions.  Due to long hold times on the telephone, sending your provider a message by [facility] may be a faster and more efficient way to get a  response.  Please allow 48 business hours for a response.  Please remember that this is for non-urgent requests.  _______________________________________________________

## 2023-03-04 ENCOUNTER — Other Ambulatory Visit: Payer: Self-pay

## 2023-03-04 ENCOUNTER — Telehealth: Payer: Self-pay | Admitting: Physician Assistant

## 2023-03-04 MED ORDER — RIFAXIMIN 550 MG PO TABS: 550.0000 mg | ORAL_TABLET | Freq: Three times a day (TID) | ORAL | 0 refills | Status: AC

## 2023-03-04 NOTE — Telephone Encounter (Signed)
Called the patient. No answer. Left message to return my call. Xifaxan to [employer] to check for insurance coverage.

## 2023-03-04 NOTE — Telephone Encounter (Signed)
Plan shared with the patient. She is in agreement with this plan of care.

## 2023-03-04 NOTE — Telephone Encounter (Signed)
Please advise 

## 2023-03-04 NOTE — Telephone Encounter (Signed)
Inbound call from patient stating that she was given a breath test kit and is wanting to know if there is alternative and if she can return the kit. Please advise.

## 2023-03-04 NOTE — Telephone Encounter (Signed)
Esterwood, Amy S, PA-C  You    Beth, please call patient and let her know there is not really an alternative to this breath test the other thing we can do which I did discuss with her at discharge treat her empirically for SIBO with a course of Xifaxan.  If she is agreeable with that we can try sending Xifaxan 550 mg 3 times daily x 14 days-hide that her insurance will not cover and if it does not then lets ask Verlon Au to please get her a course of Xifaxan samples- thank you

## 2023-03-06 NOTE — Telephone Encounter (Signed)
Per [facility], Burman Blacksmith was not covered. The prescription was transferred to [facility].

## 2023-03-11 ENCOUNTER — Ambulatory Visit: Payer: Medicare Other | Admitting: Dermatology

## 2023-03-24 ENCOUNTER — Encounter: Payer: Self-pay | Admitting: Pulmonary Disease
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
## 2023-04-09 NOTE — Patient Instructions (Signed)
Below is our plan:  We will continue clonazepam 0.25 (1/2 tablet) every night as needed. Switch donepezil dose to mornings to see if this helps with dreaming. Please monitor ocular symptoms. Document these events so we can share with Dohmeier at next visit. We can order MRI if needed. Check in with your eye doctor. Drink plenty of water (at least 40-50 ounces) daily.   Please make sure you are staying well hydrated. I recommend 50-60 ounces daily. Well balanced diet and regular exercise encouraged. Consistent sleep schedule with 6-8 hours recommended.   Please continue follow up with care team as directed.   Follow up with Dr Vickey Huger in 4-6 months   You may receive a survey regarding today's visit. I encourage you to leave honest feed back as I do use this information to improve patient care. Thank you for seeing me today!   Management of Memory Problems   There are some general things you can do to help manage your memory problems.  Your memory may not in fact recover, but by using techniques and strategies you will be able to manage your memory difficulties better.   1)  Establish a routine. Try to establish and then stick to a regular routine.  By doing this, you will get used to what to expect and you will reduce the need to rely on your memory.  Also, try to do things at the same time of day, such as taking your medication or checking your calendar first thing in the morning. Think about think that you can do as a part of a regular routine and make a list.  Then enter them into a daily planner to remind you.  This will help you establish a routine.   2)  Organize your environment. Organize your environment so that it is uncluttered.  Decrease visual stimulation.  Place everyday items such as keys or cell phone in the same place every day (ie.  Basket next to front door) Use post it notes with a brief message to yourself (ie. Turn off light, lock the door) Use labels to indicate where  things go (ie. Which cupboards are for food, dishes, etc.) Keep a notepad and pen by the telephone to take messages   3)  Memory Aids A diary or journal/notebook/daily planner Making a list (shopping list, chore list, to do list that needs to be done) Using an alarm as a reminder (kitchen timer or cell phone alarm) Using cell phone to store information (Notes, Calendar, Reminders) Calendar/White board placed in a prominent position Post-it notes   In order for memory aids to be useful, you need to have good habits.  It's no good remembering to make a note in your journal if you don't remember to look in it.  Try setting aside a certain time of day to look in journal.   4)  Improving mood and managing fatigue. There may be other factors that contribute to memory difficulties.  Factors, such as anxiety, depression and tiredness can affect memory. Regular gentle exercise can help improve your mood and give you more energy. Exercise: there are short videos created by the [employer] on Health specially for older adults: https://bit.ly/2I30q97.  Mediterranean diet: which emphasizes fruits, vegetables, whole grains, legumes, fish, and other seafood; unsaturated fats such as olive oils; and low amounts of red meat, eggs, and sweets. A variation of this, called MIND [facility] Intervention for Neurodegenerative Delay) incorporates the DASH (Dietary Approaches to Stop Hypertension) diet, which has  been shown to lower high blood pressure, a risk factor for Alzheimer's disease. More information at: ExitMarketing.de.  Aerobic exercise that improve heart health is also good for the mind.  [employer] on Aging have short videos for exercises that you can do at home: BlindWorkshop.com.pt Simple relaxation techniques may help relieve symptoms of anxiety Try to get back to completing activities or hobbies you  enjoyed doing in the past. Learn to pace yourself through activities to decrease fatigue. Find out about some local support groups where you can share experiences with others. Try and achieve 7-8 hours of sleep at night.   Tasks to improve attention/working memory 1. Good sleep hygiene (7-8 hrs of sleep) 2. Learning a new skill (Painting, Carpentry, Pottery, new language, Knitting). 3.Cognitive exercises (keep a daily journal, Puzzles) 4. Physical exercise and training  (30 min/day X 4 days week) 5. Being on Antidepressant if needed 6.Yoga, Meditation, Tai Chi 7. Decrease alcohol intake 8.Have a clear schedule and structure in daily routine   MIND Diet: The Mediterranean-DASH Diet Intervention for Neurodegenerative Delay, or MIND diet, targets the health of the aging brain. Research participants with the highest MIND diet scores had a significantly slower rate of cognitive decline compared with those with the lowest scores. The effects of the MIND diet on cognition showed greater effects than either the Mediterranean or the DASH diet alone.   The healthy items the MIND diet guidelines suggest include:   3+ servings a day of whole grains 1+ servings a day of vegetables (other than green leafy) 6+ servings a week of green leafy vegetables 5+ servings a week of nuts 4+ meals a week of beans 2+ servings a week of berries 2+ meals a week of poultry 1+ meals a week of fish Mainly olive oil if added fat is used   The unhealthy items, which are higher in saturated and trans fat, include: Less than 5 servings a week of pastries and sweets Less than 4 servings a week of red meat (including beef, pork, lamb, and products made from these meats) Less than one serving a week of cheese and fried foods Less than 1 tablespoon a day of butter/stick margarine

## 2023-04-10 ENCOUNTER — Encounter: Payer: Self-pay | Admitting: Family Medicine

## 2023-04-10 ENCOUNTER — Ambulatory Visit: Payer: Medicare Other | Admitting: Family Medicine

## 2023-04-10 ENCOUNTER — Encounter: Payer: Self-pay | Admitting: Pulmonary Disease

## 2023-04-10 VITALS — BP 144/83 | HR 64 | Ht 65.0 in | Wt 130.0 lb

## 2023-04-10 DIAGNOSIS — G43109 Migraine with aura, not intractable, without status migrainosus: Secondary | ICD-10-CM

## 2023-04-10 DIAGNOSIS — G4752 REM sleep behavior disorder: Secondary | ICD-10-CM | POA: Diagnosis not present

## 2023-04-10 DIAGNOSIS — G3184 Mild cognitive impairment, so stated: Secondary | ICD-10-CM

## 2023-04-10 MED ORDER — CLONAZEPAM 0.5 MG PO TABS: 0.2500 mg | ORAL_TABLET | Freq: Every day | ORAL | 5 refills | Status: DC

## 2023-04-10 MED ORDER — DONEPEZIL HCL 10 MG PO TABS: 10.0000 mg | ORAL_TABLET | Freq: Every day | ORAL | 3 refills | Status: AC

## 2023-05-08 DIAGNOSIS — R051 Acute cough: Secondary | ICD-10-CM | POA: Diagnosis not present

## 2023-05-08 DIAGNOSIS — J069 Acute upper respiratory infection, unspecified: Secondary | ICD-10-CM | POA: Diagnosis not present

## 2023-05-18 ENCOUNTER — Encounter: Payer: Self-pay | Admitting: Neurology

## 2023-05-20 ENCOUNTER — Encounter: Payer: Self-pay | Admitting: Family Medicine

## 2023-05-20 ENCOUNTER — Telehealth: Payer: Self-pay | Admitting: Family Medicine

## 2023-05-20 NOTE — Telephone Encounter (Signed)
 See phone call from 05/20/23

## 2023-05-20 NOTE — Telephone Encounter (Signed)
 Pt called to check if neurologist had reviewed the MyChart message sent. Would like a call back.

## 2023-05-20 NOTE — Telephone Encounter (Signed)
 POD 3- anywhere that pt can be worked into Dr. Oliva Bustard schedule?

## 2023-05-20 NOTE — Telephone Encounter (Signed)
 Pt called, asking for nurse to review MyChart sent 05/18/23 with details of blurred vision and lightheadedness and dizziness. Please respond

## 2023-05-20 NOTE — Telephone Encounter (Signed)
 Mychart went under Dr. Oliva Bustard name. I forwarded mychart to Amy to review since she last saw pt.

## 2023-05-21 ENCOUNTER — Encounter: Payer: Self-pay | Admitting: Neurology

## 2023-05-21 ENCOUNTER — Ambulatory Visit: Payer: Medicare Other | Admitting: Neurology

## 2023-05-21 VITALS — BP 120/73 | Ht 65.0 in | Wt 128.4 lb

## 2023-05-21 DIAGNOSIS — H8190 Unspecified disorder of vestibular function, unspecified ear: Secondary | ICD-10-CM

## 2023-05-21 DIAGNOSIS — H814 Vertigo of central origin: Secondary | ICD-10-CM

## 2023-05-21 DIAGNOSIS — G43109 Migraine with aura, not intractable, without status migrainosus: Secondary | ICD-10-CM | POA: Diagnosis not present

## 2023-05-21 DIAGNOSIS — G4752 REM sleep behavior disorder: Secondary | ICD-10-CM | POA: Diagnosis not present

## 2023-05-21 DIAGNOSIS — G3184 Mild cognitive impairment, so stated: Secondary | ICD-10-CM

## 2023-05-21 NOTE — Progress Notes (Signed)
 Provider:  Melvyn Novas, MD  Primary Care Physician:  Kara Quitter, MD [address] [place] [address]     Referring Provider: Melida Quitter, Md [address] [place],  [place] [address]          Chief Complaint according to patient   Patient presents with:                HISTORY OF PRESENT ILLNESS:  Kara Tapia is a 83 y.o. female patient who is here for revisit 05/21/2023 for DIZZINESS> .  Chief concern according to patient :  I have been dizzy for months now and frequent ophthalmic migraines accompany this. I have also had a viral illness, a cold over the last month, and that added a layer of difficulties. I was treated by Z pack, Covid test negative.   I have followed Kara Tapia for many years now when we know each other for almost 20 years.  She gave me today a list of her recent optic migraine spells and beginning on 22 January a migraine that lasted about 10 minutes triggered by rapid movement, she had said to repeat because she felt otherwise at risk of falling.  On 1-23 another migraine this time 25 minutes long while working in the kitchen and standing she had to sit.  On 127 50 minutes by painting.  On 129 about 2 PM while reading, duration not stated.  On February 14 she has another 1 with a rapid movement on February 15 while walking into her kitchen at 7 pM.  On February 16 at 715 movements triggered this 1 as well on February 24 while driving and she had to take a break.  On February 25 which was yesterday while watching TV at 7.  She also gave me the correlating list of blood pressures  20 February 123/86, on 21 February she took her blood pressure during a spell 134/95 and at 6 PM 111/75 on the 22nd in the morning 159/94 and at 12 noon she repeated this 145/99  On Saturday the 22nd at 230 124/79 and Sunday through 23 at 8:30 AM 121/84 at 6 PM 117/80 mmHg, so several of the morning blood pressures are 10 to 20 mmHg lower than her systolic blood pressures  in the afternoon.     Fam Hx: I also follow her daughter Kara Tapia with Down syndrome and Alzheimer's type dementia.    Social HX : living alone with her cat " Kara Tapia".   04/10/23 ALL: Kara Tapia returns for follow up for RBD and MCI. She was last seen 03/2022 and doing well on clonazepam 0.25mg  QHS PRN and donepezil 10mg  QHS. MMSE 27/30.    Since, she reports doing fairly well. Headaches are rare. Ocular symptoms seem to be occurring about 3-4 times a week. She describes black holes in vision. She is able to close her eyes for about 5 minutes and symptoms resolve. Dark room helps alleviate symptoms. She denies pain. At night she reports seeing white rods shooting in vision when she turns over too fast. She is trying to drink more water. Usually gets less than 40 ounces daily. No weakness, dizziness, or facial asymmetry.    Sleep continues to be "off and on". She is usually able to get about 6-8 hours of sleep each night. Some nights are restless. Dreaming 3-4 times a week She tries to limit use of clonazepam. Last filled clonazepam 0.5mg  tablets qty 15 for  30 day supply 02/01/2023.    She feels memory is "sometimes good and sometimes not". She continues donepezil 10mg  at bedtime. Asking to take in the mornings. She is completely independent at home. Manages her finances and medications independently. Drives without difficulty. She endorses more stress as her daughter is ill.   04/04/22 ALL: Kara Tapia returns for follow up for RBD and MCI. She continues clonazepam 0.25mg  QHS PRN and donepezil 10mg  QHS. She is doing very well. She takes 1/2 table of clonazepam 2-3 times a week. She is tolerating it well. Memory is stable. No significant changes. She lives alone with her cat, Kara Tapia. She performs all ADLs independently. She drives without difficulty.    She reports having ocular symptoms. She reports being diagnosed with ocular migraines by Kara Tapia several years ago. She reports feeling that she is very  sensitive to light, especially when going from dim lighting and out in the sun. Occasionally she will have a headache with symptoms. Rest and getting out of the sunlight helps. She does not wish to start any new medications. She does have dry eyes but not currently treating due to outdated rx.    04/03/2021 ALL: Kara Tapia returns for follow up for RBD and MCI. She continues clonazepam 0.25mg  QHS as needed and donepezil 10mg  QHS. She is tolerating meds well. She feels she is doing okay. She is under more stress as her daughter, Kara Tapia, is in the hospital for worsening myoclonus. Kara Tapia has not been sleeping well since and is more tired from visiting her daughter. Memory is stable. No significant changes.    03/30/2020 ALL: Kara Tapia presents today for follow up of RBD and MCI. She continues clonazepam 0.25mg  as needed at bedtime and Aricept 5mg  BID. She is tolerating medicaitons well. She does not like having to split Aricept and would like to take once daily. She remains fairly active. She admits that she has had some trouble with a neighbor who has some mental health concerns. She hasn't gone outside as much to avoid confrontation. Covid as limited her ability to go to the gym. She feels that she is doing well. Memory is stable. She is driving and managing home independently and without difficulty.    History (copied from previous notes)   Kara Tapia is a 83 y.o. female here today for follow up for REM disorder and MCI. She continues clonazepam 0.25mg  at bedtime and Aricept 5mg  BID. She is doing well. She rarely has nightmares. She is sleeping well. She does endorse having 1-2 occular migraines per month. Symptoms usually last 15-30 minutes. Occasionally last about an hour. If she gets a headache, it is typically aborted with Tylenol. PMP aware reviewed and clonazepam was last refilled 09/24/2019. Appropriate refills verified.    Memory is stable. She is tolerating Aricept. She is very active. She lives alone. She  is able to drive without difficulty and manages her home.      Review of Systems: Out of a complete 14 system review, the patient complains of only the following symptoms, and all other reviewed systems are negative.:    Dizziness spells in PM .   Migraines in PM.   Photophobic and questio of BPV versus vestibular migraines.   Social History   Socioeconomic History   Marital status: Divorced    Spouse name: Not on file   Number of children: 3   Years of education: 16   Highest education level: Not on file  Occupational History   Occupation: retired  Tobacco Use   Smoking status: Every Day    Current packs/day: 0.50    Types: Cigarettes    Passive exposure: Never   Smokeless tobacco: Never  Vaping Use   Vaping status: Never Used  Substance and Sexual Activity   Alcohol use: Yes    Alcohol/week: 5.0 standard drinks of alcohol    Types: 5 Glasses of wine per week    Comment: 1 glass of wine nightly   Drug use: No   Sexual activity: Not on file  Other Topics Concern   Not on file  Social History Narrative   Drinks 3-4 cups of caffeine daily.   Social Drivers of Corporate investment banker Strain: Not on file  Food Insecurity: Not on file  Transportation Needs: Not on file  Physical Activity: Not on file  Stress: Not on file  Social Connections: Unknown (08/07/2021)   [facility]   Social Network    Social Network: Not on file    Family History  Problem Relation Age of Onset   Diabetes Mother    Heart disease Mother    Cancer Mother        type unknown   Bone cancer Father    Diabetes Sister    Lung disease Sister    COPD Sister    Heart attack Paternal Grandfather    Down syndrome Daughter    Dementia Daughter    Other Daughter        colon surgery    Past Medical History:  Diagnosis Date   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     amLODipine (NORVASC) 2.5 MG tablet Take 2.5 mg by mouth daily.     CALCIUM-MAGNESIUM-VITAMIN D PO Take 1 tablet by mouth daily.     clonazePAM (KLONOPIN) 0.5 MG tablet Take 0.5 tablets (0.25 mg total) by mouth at bedtime. 15 tablet 5   donepezil (ARICEPT) 10 MG tablet Take 1 tablet (10 mg total) by mouth daily. 90 tablet 3   rosuvastatin (CRESTOR) 10 MG tablet Take 10 mg by mouth at bedtime.     loperamide (IMODIUM) 2 MG capsule Take one tablet every 6 hours as needed for diarrhea. 30 capsule 2   Wheat Dextrin (BENEFIBER PO) Take 1 Capful by mouth daily. (Patient not taking:  Reported on 05/21/2023)     No current facility-administered medications on file prior to visit.    Allergies  Allergen Reactions   Caine-1 [Lidocaine] Rash and Other (See Comments)    Affects her BP     DIAGNOSTIC DATA (LABS, IMAGING, TESTING) - I reviewed patient records, labs,  notes, testing and imaging myself where available.  Lab Results  Component Value Date   WBC 5.5 10/02/2021   HGB 13.4 10/02/2021   HCT 39.7 10/02/2021   MCV 97.9 10/02/2021   PLT 232.0 10/02/2021      Component Value Date/Time   NA 134 (L) 10/02/2021 1643   NA 139 11/18/2018 1418   K 4.2 10/02/2021 1643   CL 100 10/02/2021 1643   CO2 28 10/02/2021 1643   GLUCOSE 90 10/02/2021 1643   BUN 15 10/02/2021 1643   BUN 15 11/18/2018 1418   CREATININE 0.94 10/02/2021 1643   CALCIUM 9.0 10/02/2021 1643   PROT 6.7 10/02/2021 1643   PROT 7.1 06/11/2016 1657   ALBUMIN 4.5 10/02/2021 1643   ALBUMIN 4.8 06/11/2016 1657   AST 17 10/02/2021 1643   ALT 14 10/02/2021 1643   ALKPHOS 65 10/02/2021 1643   BILITOT 0.4 10/02/2021 1643   BILITOT 0.4 06/11/2016 1657   GFRNONAA 62 11/18/2018 1418   GFRAA 71 11/18/2018 1418   No results found for: "CHOL", "HDL", "LDLCALC", "LDLDIRECT", "TRIG", "CHOLHDL" No results found for: "HGBA1C" No results found for: "VITAMINB12" Lab Results  Component Value Date   TSH 0.975 06/11/2016    PHYSICAL EXAM:  Today's Vitals   05/21/23 0827  BP: 120/73  Weight: 128 lb 6.4 oz (58.2 kg)  Height: 5\' 5"  (1.651 m)   Body mass index is 21.37 kg/m.   Wt Readings from Last 3 Encounters:  05/21/23 128 lb 6.4 oz (58.2 kg)  04/10/23 130 lb (59 kg)  02/25/23 130 lb (59 kg)     Ht Readings from Last 3 Encounters:  05/21/23 5\' 5"  (1.651 m)  04/10/23 5\' 5"  (1.651 m)  02/25/23 5\' 5"  (1.651 m)      General: The patient is awake, alert and appears not in acute distress. The patient is well groomed. Head: Normocephalic, atraumatic. Neck is supple.  Dental status: biological  Cardiovascular:  Regular rate and cardiac rhythm by pulse,  without distended neck veins. Respiratory: coughing and reportedly having  productive cough.  Skin:  Without evidence of ankle edema, or rash. Trunk: The patient's posture is erect.   NEUROLOGIC EXAM: The patient is awake and  alert, oriented to place and time.   Memory subjective described as impaired - recall impaired. See last visit with amy lomax, NP       04/10/2023   11:03 AM 04/04/2022   11:08 AM 04/03/2021   11:28 AM 03/30/2020   11:30 AM 09/28/2019   11:29 AM 02/22/2019    2:03 PM 11/18/2018    1:16 PM  MMSE - Mini Mental State Exam  Orientation to time 5 5 5 5 5 5 5   Orientation to Place 5 5 5 5 5 5 5   Registration 3 3 3 3 3 3 3   Attention/ Calculation 5 2 5 4 5 5 5   Attention/Calculation-comments   refuse calculation, had her spell WORLD backwards      Recall 3 3 3 3 3 3 2   Language- name 2 objects 2 2 2 2 2 2 2   Language- repeat 1 1 1 1 1 1 1   Language- follow 3 step  command 2 3 3 3 3 3 3   Language- read & follow direction 1 1 1 1 1 1 1   Write a sentence 1 1 1 1 1 1 1   Copy design 1 1 1 1 1 1 1   Total score 29 27 30 29 30 30 29      Attention span & concentration ability appears normal.  Speech is fluent,  with dysarthria, dysphonia , not  aphasia.  Mood and affect are appropriate.   Cranial nerves: no loss of smell or taste reported  Pupils are equal and briskly reactive to light. Funduscopic exam deferred. .  Extraocular movements in vertical and horizontal planes were intact and without nystagmus.  No Diplopia. Visual fields by finger perimetry are intact. Hearing was intact to soft voice and finger rubbing.    Facial sensation intact to fine touch.  Facial motor strength is symmetric and tongue and uvula move midline.  Neck ROM : rotation, tilt and flexion extension were normal for age and shoulder shrug was symmetrical.    Motor exam:   lumbar strain- back pain,  Symmetric bulk, tone and ROM.   Normal tone without cog wheeling, symmetric grip strength .   Sensory:  Fine touch and vibration were absent !!! Can't feel vibration at the knee level, bilaterally. .  Proprioception tested in the upper extremities was normal.   Coordination: Rapid alternating movements in the fingers/hands  were of normal speed.  The Finger-to-nose maneuver was intact without evidence of ataxia, dysmetria  but with endpoint tremor.  No resting tremor.  She is able to do calligraphy.    Gait and station: Patient could rise unassisted from a seated position, walked without assistive device.  Stance is of normal width/ base a.  Toe and heel walk were deferred.  Deep tendon reflexes: in the  upper and lower extremities are attenuated, I cannot elicit a patella reflex (!)  Babinski response was deferred.     ASSESSMENT AND PLAN 83 y.o. year old female  here with:  Areflexia at patella level, loss of vibration and fine touch sensation  in both knees and below.   Lumbar radiculopathy, sciatica. Has recovered with the help of PT  Dysphagia , dysphonia, constipation, vertigo, "optical migraine "  Known history of  aortic calcifications.   Subjective MCI reported.  Dry eyes.     1) Reports underlying, latent vertigo sensation  in unexpected situations. Can be while reading  or while taking 4 steps out of her car onto the curb, can be provoked by looking up or turning the head left or right.   2)  I suspect vestibular migraines - these change depth perception and influence the vertigo sensation.   3) not sure that BP has anything to do with it.    I will start her with vestibular rehab.  I will refer for a vision and visual field examination.    MRI brain and MRA, MRi cervical spine. - ataxia and sensory loss.  Neuropathy panel.  Bubble study , doppler neck and head.  Check vit D- calcium levels.  CMET, TSH and CBC     I plan to follow up either personally or through our NP within 3-4 months.   I would like to thank Nadene Rubins Nyoka Cowden, MD and Kara Quitter, Md [address] [place],  [place] [address] for allowing me to meet with and to take care of this pleasant patient.    After spending a total time of  45  minutes face to face and additional time for physical and neurologic examination,  review of laboratory studies,  personal review of imaging studies, reports and results of other testing and review of referral information / records as far as provided in visit,   Electronically signed by: Kara Novas, MD 05/21/2023 8:42 AM  [facility] Sleep Board certified by The [employer] of Sleep Medicine and Diplomate of the [employer] of Sleep Medicine. Board certified In Neurology through the ABPN, Fellow of the [employer] of Neurology.

## 2023-05-21 NOTE — Patient Instructions (Signed)
 How to Perform the Epley Maneuver The Epley maneuver is an exercise that relieves symptoms of vertigo. Vertigo is the feeling that you or your surroundings are moving when they are not. When you feel vertigo, you may feel like the room is spinning and may have trouble walking. The Epley maneuver is used for a type of vertigo caused by a calcium deposit in a part of the inner ear. The maneuver involves changing head positions to help the deposit move out of the area. You can do this maneuver at home whenever you have symptoms of vertigo. You can repeat it in 24 hours if your vertigo has not gone away. Even though the Epley maneuver may relieve your vertigo for a few weeks, it is possible that your symptoms will return. This maneuver relieves vertigo, but it does not relieve dizziness. What are the risks? If it is done correctly, the Epley maneuver is considered safe. Sometimes it can lead to dizziness or nausea that goes away after a short time. If you develop other symptoms--such as changes in vision, weakness, or numbness--stop doing the maneuver and call your health care provider. Supplies needed: A bed or table. A pillow. How to do the Epley maneuver     Sit on the edge of a bed or table with your back straight and your legs extended or hanging over the edge of the bed or table. Turn your head halfway toward the affected ear or side as told by your health care provider. Lie backward quickly with your head turned until you are lying flat on your back. Your head should dangle (head-hanging position). You may want to position a pillow under your shoulders. Hold this position for at least 30 seconds. If you feel dizzy or have symptoms of vertigo, continue to hold the position until the symptoms stop. Turn your head to the opposite direction until your unaffected ear is facing down. Your head should continue to dangle. Hold this position for at least 30 seconds. If you feel dizzy or have symptoms of  vertigo, continue to hold the position until the symptoms stop. Turn your whole body to the same side as your head so that you are positioned on your side. Your head will now be nearly facedown and no longer needs to dangle. Hold for at least 30 seconds. If you feel dizzy or have symptoms of vertigo, continue to hold the position until the symptoms stop. Sit back up. You can repeat the maneuver in 24 hours if your vertigo does not go away. Follow these instructions at home: For 24 hours after doing the Epley maneuver: Keep your head in an upright position. When lying down to sleep or rest, keep your head raised (elevated) with two or more pillows. Avoid excessive neck movements. Activity Do not drive or use machinery if you feel dizzy. After doing the Epley maneuver, return to your normal activities as told by your health care provider. Ask your health care provider what activities are safe for you. General instructions Drink enough fluid to keep your urine pale yellow. Do not drink alcohol. Take over-the-counter and prescription medicines only as told by your health care provider. Keep all follow-up visits. This is important. Preventing vertigo symptoms Ask your health care provider if there is anything you should do at home to prevent vertigo. He or she may recommend that you: Keep your head elevated with two or more pillows while you sleep. Do not sleep on the side of your affected ear. Get  up slowly from bed. Avoid sudden movements during the day. Avoid extreme head positions or movement, such as looking up or bending over. Contact a health care provider if: Your vertigo gets worse. You have other symptoms, including: Nausea. Vomiting. Headache. Get help right away if you: Have vision changes. Have a headache or neck pain that is severe or getting worse. Cannot stop vomiting. Have new numbness or weakness in any part of your body. These symptoms may represent a serious problem  that is an emergency. Do not wait to see if the symptoms will go away. Get medical help right away. Call your local emergency services (911 in the [place] Do not drive yourself to the hospital. Summary Vertigo is the feeling that you or your surroundings are moving when they are not. The Epley maneuver is an exercise that relieves symptoms of vertigo. If the Epley maneuver is done correctly, it is considered safe. This information is not intended to replace advice given to you by your health care provider. Make sure you discuss any questions you have with your health care provider. Document Revised: 12/06/2022 Document Reviewed: 12/06/2022 Elsevier Patient Education  2024 [employer].Dizziness Dizziness is a common problem. It makes you feel unsteady or light-headed. You may feel like you're about to faint. Dizziness can lead to getting hurt if you stumble or fall. It's more common to feel dizzy if you're an older adult. Many things can cause you to feel dizzy. These include: Medicines. Dehydration. This is when there's not enough water in your body. Illness. Follow these instructions at home: Eating and drinking  Drink enough fluid to keep your pee (urine) pale yellow. This helps keep you from getting dehydrated. Try to drink more clear fluids, such as water. Do not drink alcohol. Try to limit how much caffeine you take in. Try to limit how much salt, also called sodium, you take in. Activity Try not to make quick movements. Stand up slowly from sitting in a chair. Steady yourself until you feel okay. In the morning, first sit up on the side of the bed. When you feel okay, hold onto something and slowly stand up. Do this until you know that your balance is okay. If you need to stand in one place for a long time, move your legs often. Tighten and relax the muscles in your legs while you're standing. Do not drive or use machines if you feel dizzy. Avoid bending down if you feel dizzy.  Place items in your home so you can reach them without leaning over. Lifestyle Do not smoke, vape, or use products with nicotine or tobacco in them. If you need help quitting, talk with your health care provider. Try to lower your stress level. You can do this by using methods like yoga or meditation. Talk with your provider if you need help. General instructions Watch your dizziness for any changes. Take your medicines only as told by your provider. Talk with your provider if you think you're dizzy because of a medicine you're taking. Tell a friend or a family member that you're feeling dizzy. If they spot any changes in your behavior, have them call your provider. Contact a health care provider if: Your dizziness doesn't go away, or you have new symptoms. Your dizziness gets worse. You feel like you may vomit. You have trouble hearing. You have a fever. You have neck pain or a stiff neck. You fall or get hurt. Get help right away if: You vomit each time you  eat or drink. You have watery poop and can't eat or drink. You have trouble talking, walking, swallowing, or using your arms, hands, or legs. You feel very weak. You're bleeding. You're not thinking clearly, or you have trouble forming sentences. A friend or family member may spot this. Your vision changes, or you get a very bad headache. These symptoms may be an emergency. Call 911 right away. Do not wait to see if the symptoms will go away. Do not drive yourself to the hospital. This information is not intended to replace advice given to you by your health care provider. Make sure you discuss any questions you have with your health care provider. Document Revised: 12/12/2022 Document Reviewed: 04/25/2022 Elsevier Patient Education  2024 [employer].

## 2023-05-22 ENCOUNTER — Encounter: Payer: Self-pay | Admitting: Neurology

## 2023-05-23 ENCOUNTER — Telehealth: Payer: Self-pay | Admitting: Internal Medicine

## 2023-05-23 NOTE — Telephone Encounter (Signed)
 Patient returned call to speak with nurse, transferred to triage.

## 2023-05-23 NOTE — Telephone Encounter (Signed)
 Patient c/o Palpitations:  STAT if patient reporting lightheadedness, shortness of breath, or chest pain  How long have you had palpitations/irregular HR/ Afib? Are you having the symptoms now? yes  Are you currently experiencing lightheadedness, SOB or CP? no  Do you have a history of afib (atrial fibrillation) or irregular heart rhythm? yes  Have you checked your BP or HR? (document readings if available): no  Are you experiencing any other symptoms? no

## 2023-05-23 NOTE — Telephone Encounter (Signed)
 Patient call transferred to triage. Patient states that she is having chest pain and shortness of breath. I can hear slightly labored breathing with talking on the phone. Patient has a history of hypertension but has not been seen in this office in close to 2 years. Instructed patient that she needs to present to the ER for a cardiac evaluation. She voiced understanding.

## 2023-05-23 NOTE — Telephone Encounter (Signed)
 Patient c/o Palpitations:  High priority if patient c/o lightheadedness, shortness of breath, or chest pain  How long have you had palpitations/irregular HR/ Afib? Are you having the symptoms now? No symptoms of the irregular heart beat right now.   Are you currently experiencing lightheadedness, SOB or CP? A little bit of everything..   Do you have a history of afib (atrial fibrillation) or irregular heart rhythm? 'Not particularly'   Have you checked your BP or HR? (document readings if available): BP has been low, BP has been irradic, very inconsistent.  Are you experiencing any other symptoms? Has been having optic migraines, sick for 3 weeks, B12 deficiency, vertigo.   Patient stated she was in her car when asked about BP readings. She asked if she could call me back. Advised that since she is having a little bit of SOB, CP and lightheadedness, that I would like to transfer her to a nurse. She stated again that she is in her car, late for an appointment but would be ok for a nurse to call back in half an hour to an hour from now to go over everything. Did not get a chance to ask if pt would like 4/17 appt moved up to discuss.

## 2023-05-26 ENCOUNTER — Other Ambulatory Visit: Payer: Self-pay

## 2023-05-26 ENCOUNTER — Ambulatory Visit: Payer: Medicare Other | Attending: Neurology | Admitting: Physical Therapy

## 2023-05-26 ENCOUNTER — Encounter: Payer: Self-pay | Admitting: Physical Therapy

## 2023-05-26 DIAGNOSIS — R2681 Unsteadiness on feet: Secondary | ICD-10-CM | POA: Diagnosis not present

## 2023-05-26 DIAGNOSIS — R42 Dizziness and giddiness: Secondary | ICD-10-CM | POA: Diagnosis not present

## 2023-05-26 NOTE — Therapy (Signed)
 OUTPATIENT PHYSICAL THERAPY VESTIBULAR EVALUATION     Patient Name: Kara Tapia MRN: [id number] DOB:01-22-1941, 83 y.o., female Today's Date: 05/27/2023  END OF SESSION:  PT End of Session - 05/26/23 1229     Visit Number 1    Number of Visits 13    Date for PT Re-Evaluation 07/04/23    Authorization Type BCBS Medicare/[employer]    Progress Note Due on Visit 10    PT Start Time 1230    PT Stop Time 1318    PT Time Calculation (min) 48 min    Activity Tolerance Patient tolerated treatment well    Behavior During Therapy WFL for tasks assessed/performed             Past Medical History:  Diagnosis Date   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/2014   PVD (peripheral vascular disease) (HCC)    Reflux esophagitis    REM sleep behavior disorder 08/09/2014   Scotoma 08/09/2014   Sleep behavior disorder, REM 06/04/2017   Past Surgical History:  Procedure Laterality Date   CARPAL TUNNEL RELEASE Right    CATARACT EXTRACTION, BILATERAL Bilateral    Patient Active Problem List   Diagnosis Date Noted   Mixed hyperlipidemia 01/09/2023   Mild memory disturbance 01/09/2023   Medicare annual wellness visit, subsequent 01/09/2023   Gastroesophageal reflux disease without esophagitis 01/09/2023   Elevated blood pressure  reading 01/09/2023   Dizziness 01/09/2023   COPD mixed type (HCC) 01/09/2023   Hematuria 01/09/2023   Abnormal cervical Papanicolaou smear 01/09/2023   Senile osteoporosis 08/03/2021   Pain in joint of right shoulder 04/29/2019   Syncope 01/12/2019   Hypertension 01/12/2019   Lisping 12/02/2017   Sleep behavior disorder, REM 06/04/2017   MCI (mild cognitive impairment) 06/04/2017   Insomnia 07/06/2015   Nightmares REM-sleep type 07/06/2015   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   Acquired trigger finger 06/14/2015   Parasomnia, organic 10/04/2014   Migraine with status migrainosus 08/09/2014   Scotoma 08/09/2014   Intractable migraine with aura with status migrainosus 08/09/2014   REM sleep behavior disorder 08/09/2014    PCP: Melida Quitter, MD REFERRING PROVIDER: Dohmeier, Porfirio Mylar, MD   REFERRING DIAG:  G43.109 (ICD-10-CM) - Migraine aura occurring with and without headache  G47.52 (ICD-10-CM) - Sleep behavior disorder, REM  G31.84 (ICD-10-CM) - MCI (mild cognitive impairment)  H81.4 (ICD-10-CM) - Vertigo of central origin  H81.90 (ICD-10-CM) - Vestibulopathy, unspecified laterality    THERAPY DIAG:  Unsteadiness on feet  Dizziness and giddiness  ONSET DATE: 05/21/2023 MD referral  Rationale for Evaluation and Treatment: Rehabilitation  SUBJECTIVE:   SUBJECTIVE STATEMENT: Dr. Vickey Huger has dx me with optical migraines, see black holes in my vision.  She wanted me to come here.  These spells (see MD notes below) have become more prevalent.  Having a bout of dizziness today (started about 10:30-10:45 today).  Feels overall decrease in energy and activity level in past 6 months. Pt accompanied by: self  PERTINENT HISTORY: Per Dr. Vickey Huger note:  recent optic migraine spells and beginning on 22 January a migraine that lasted about 10 minutes triggered by rapid movement, she had said to repeat because she felt  otherwise at risk of falling.  On 1-23 another migraine this time 25 minutes long while working in the kitchen and standing she had to sit.  On 127 50 minutes by painting.  On 129 about 2 PM while reading, duration not stated.  On February 14 she has another 1 with a rapid movement on February 15 while walking into her kitchen at 7 pM.  On February 16 at 715 movements triggered this 1 as well on February 24 while driving and she had to take a break.  On February 25 which was yesterday while watching TV at 7.  She also gave me the correlating list of blood pressures  PMH of RBD, MC, lumbar radiculopathy, areflexia at patella level, hx of vertigo and optical migraine  PAIN:  Are you having pain? No  PRECAUTIONS: Fall  RED FLAGS: None   WEIGHT BEARING RESTRICTIONS: No  FALLS: Has patient fallen in last 6 months? Yes. Number of falls 1  LIVING ENVIRONMENT: Lives with: lives alone Lives in: House/apartment Stairs: Yes: Internal: basement and upstairs; pt doesn't go up or down these often steps; outdoor steps (4) and handrail Has following equipment at home: None  PLOF: Independent and Leisure: enjoys yardwork  PATIENT GOALS:  Pt's goals for therapy are to get rid of the dizziness and help with the ability to read without dizziness  OBJECTIVE:  Note: Objective measures were completed at Evaluation unless otherwise noted.  DIAGNOSTIC FINDINGS: upcoming MRI  COGNITION: Overall cognitive status: History of cognitive impairments - at baseline    POSTURE:  rounded shoulders and forward head  Cervical ROM:    Active A/PROM (deg) eval  Flexion 22 with dizziness  Extension 31  Right lateral flexion   Left lateral flexion   Right rotation 33  Left rotation 35  (Blank rows = not tested) *generally swimmy headed with ROM testing*  GAIT: Gait pattern: WFL Distance walked: clinic distances Assistive device utilized: None Level of assistance: Complete Independence   PATIENT  SURVEYS:  DHI 24  VESTIBULAR ASSESSMENT:  GENERAL OBSERVATION: Pt in no acute distress.   SYMPTOM BEHAVIOR:  Subjective history: Been having "holes in vision", headaches and dizziness, that have been going on for several years; worsening in prevalence in past 1-6 months.  Had virus in the past month, but it was not Covid  Non-Vestibular symptoms: headaches, migraine symptoms, and black spots/holes in vision  Type of dizziness: Imbalance (Disequilibrium), Unsteady with head/body turns, Lightheadedness/Faint, and "drunk feeling"  Frequency: >3x/wk  Duration: minutes  Aggravating factors: Induced by motion: turning body quickly and turning head quickly and definite changes in light (going from bright outside light to indoors)  Relieving factors: lying supine, closing eyes, and rest  Progression of symptoms: worse  OCULOMOTOR EXAM:  Wears progressive lenses; blinks frequently and reports this is due to dry eyes  Ocular Alignment: normal  Ocular ROM: No Limitations  Spontaneous Nystagmus: absent  Gaze-Induced Nystagmus: absent  Smooth Pursuits: intact  Saccades: intact  Convergence/Divergence: 2 cm   VESTIBULAR - OCULAR REFLEX:   Slow VOR:  Comment: horizontal 3/10 swimmy headed; no symptoms vertical  VOR Cancellation: Corrective Saccades with horizontal 6/10  Head-Impulse Test: HIT Right: positive HIT Left: positive Increased symptoms bilaterally  Dynamic Visual Acuity:  NT   POSITIONAL TESTING: Other: NT due to time constraints and pt does not report any spinning sensations  MOTION SENSITIVITY:NT  ORTHOSTATICS: Seated 125/86, HR 70; standing 90/71, HR 96 bpm    M-CTSIB  Condition 1: Firm Surface, EO 30 Sec, Mild Sway  Condition 2: Firm Surface, EC 30 Sec, Moderate Sway  Condition 3: Foam Surface, EO 30 Sec, Mild Sway  Condition 4: Foam Surface, EC 23Sec, Severe Sway                                                                                                                               TREATMENT DATE: 05/26/2023    PATIENT EDUCATION: Education details: Eval results, POC, rationale for vestibular rehab; recommendations for general activity level and hydration; educated on BP measures and orthostatic BP  Person educated: Patient Education method: Explanation Education comprehension: verbalized understanding  HOME EXERCISE PROGRAM:  GOALS: Goals reviewed with patient? Yes  SHORT TERM GOALS: Target date: 06/06/2023  Pt will be independent with HEP for improved balance, decreased dizziness/headache symptoms. Baseline: Goal status: INITIAL   LONG TERM GOALS: Target date: 07/04/2023  Pt will be independent with progression of HEP for improved balance, decreased dizziness and headache symptoms. Baseline:  Goal status: INITIAL  2.  Pt will improve MCTSIB Condition 4 to 30 seconds with mod sway or less for improved balance and functional mobility. Baseline:  Goal status: INITIAL  3.  FGA to be assessed with pt to score >/=22/30 for decreased fall risk Baseline: TBA Goal status: INITIAL  4.  DHI score to improve to </= 20 to demo decreased dizziness. Baseline:  Goal status: INITIAL  ASSESSMENT:  CLINICAL IMPRESSION: Patient is an 83 y.o. female who was seen today for physical therapy evaluation and treatment for vertigo, migraines, dizziness.  She reports at least 6 month history of worsening optic migraines, as well as dizziness/imbalance, which has especially gotten more prominent in past month.  She describes her feelings as imbalance, disequilibrium, and swimmy-headed/drunk sensation.  She describes photophobia bringing on migraines with intense bright light.  She has had one fall.  She does endorse overall decrease in activity level in the past 6 months.  With VOR testing, she has increased dizziness, swimmy-head symtpoms and she has most symptoms with VOR cancellation and bilat HIT testing.  With MCTSIB test, she demo decreased vestibular system  use for balance, unable to hold balance on Condition 4 for 30 sec.  *Of note, based on some of pt's reports, PT did assess seated and standing BP measures.  BP measures drop significantly from 125/86 to 90/71.*  Will plan to do true orthostatic measures next visit, but PT did educate in ways to offset this significant BP drop and that I would make  MD aware of these measures from evaluation.  She would benefit from skilled PT to address the above stated deficits to decrease fall risk and improve overall functional mobility.  OBJECTIVE IMPAIRMENTS: decreased balance, decreased mobility, decreased ROM, dizziness, postural dysfunction, pain, and headaches .   ACTIVITY LIMITATIONS: standing, transfers, locomotion level, and caring for others  PARTICIPATION LIMITATIONS: meal prep, cleaning, laundry, driving, shopping, and community activity  PERSONAL FACTORS: 3+ comorbidities: see above  are also affecting patient's functional outcome.   REHAB POTENTIAL: Good  CLINICAL DECISION MAKING: Stable/uncomplicated  EVALUATION COMPLEXITY: Low   PLAN:  PT FREQUENCY: 2x/week  PT DURATION: 6 weeks including eval week  PLANNED INTERVENTIONS: 97110-Therapeutic exercises, 97530- Therapeutic activity, 97112- Neuromuscular re-education, 97535- Self Care, 40981- Manual therapy, [phone]- Gait training, [phone]- Canalith repositioning, Patient/Family education, Balance training, and Vestibular training  PLAN FOR NEXT SESSION: Initiate HEP; assess for BPPV and assess true orthostatic BP measures   Kara Tapia W., PT 05/27/2023, 7:48 AM   [facility] [address] [place], [place] [address] Phone # [phone] Fax # [phone]

## 2023-05-27 ENCOUNTER — Encounter: Payer: Self-pay | Admitting: Physical Therapy

## 2023-05-27 ENCOUNTER — Telehealth: Payer: Self-pay | Admitting: Neurology

## 2023-05-27 ENCOUNTER — Other Ambulatory Visit: Payer: Self-pay | Admitting: Neurology

## 2023-05-27 DIAGNOSIS — G43109 Migraine with aura, not intractable, without status migrainosus: Secondary | ICD-10-CM

## 2023-05-27 DIAGNOSIS — H8190 Unspecified disorder of vestibular function, unspecified ear: Secondary | ICD-10-CM

## 2023-05-27 DIAGNOSIS — G3184 Mild cognitive impairment, so stated: Secondary | ICD-10-CM

## 2023-05-27 DIAGNOSIS — G4752 REM sleep behavior disorder: Secondary | ICD-10-CM

## 2023-05-27 DIAGNOSIS — H814 Vertigo of central origin: Secondary | ICD-10-CM

## 2023-05-27 NOTE — Telephone Encounter (Signed)
 [employer]BS medicare Berkley Harvey: [id number] exp. 05/27/23-06/25/23, [employer]  [phone]

## 2023-05-27 NOTE — Telephone Encounter (Signed)
 Referral faxed to [facility] (fax# [phone], phone# [phone])

## 2023-05-28 ENCOUNTER — Telehealth: Payer: Self-pay | Admitting: Neurology

## 2023-05-28 LAB — CBC WITH DIFFERENTIAL/PLATELET
Basophils Absolute: 0.1 10*3/uL (ref 0.0–0.2)
Basos: 1 %
EOS (ABSOLUTE): 0.1 10*3/uL (ref 0.0–0.4)
Eos: 2 %
Hematocrit: 43.6 % (ref 34.0–46.6)
Hemoglobin: 14.9 g/dL (ref 11.1–15.9)
Immature Grans (Abs): 0 10*3/uL (ref 0.0–0.1)
Immature Granulocytes: 0 %
Lymphocytes Absolute: 1.8 10*3/uL (ref 0.7–3.1)
Lymphs: 31 %
MCH: 34 pg — ABNORMAL HIGH (ref 26.6–33.0)
MCHC: 34.2 g/dL (ref 31.5–35.7)
MCV: 100 fL — ABNORMAL HIGH (ref 79–97)
Monocytes Absolute: 0.4 10*3/uL (ref 0.1–0.9)
Monocytes: 6 %
Neutrophils Absolute: 3.5 10*3/uL (ref 1.4–7.0)
Neutrophils: 60 %
Platelets: 270 10*3/uL (ref 150–450)
RBC: 4.38 x10E6/uL (ref 3.77–5.28)
RDW: 11.9 % (ref 11.7–15.4)
WBC: 5.9 10*3/uL (ref 3.4–10.8)

## 2023-05-28 LAB — COMPREHENSIVE METABOLIC PANEL
ALT: 25 IU/L (ref 0–32)
AST: 26 IU/L (ref 0–40)
Albumin: 4.9 g/dL — ABNORMAL HIGH (ref 3.7–4.7)
Alkaline Phosphatase: 71 IU/L (ref 44–121)
BUN/Creatinine Ratio: 13 (ref 12–28)
BUN: 12 mg/dL (ref 8–27)
Bilirubin Total: 0.3 mg/dL (ref 0.0–1.2)
CO2: 21 mmol/L (ref 20–29)
Calcium: 9.7 mg/dL (ref 8.7–10.3)
Chloride: 96 mmol/L (ref 96–106)
Creatinine, Ser: 0.93 mg/dL (ref 0.57–1.00)
Globulin, Total: 2.1 g/dL (ref 1.5–4.5)
Glucose: 79 mg/dL (ref 70–99)
Potassium: 4.8 mmol/L (ref 3.5–5.2)
Sodium: 135 mmol/L (ref 134–144)
Total Protein: 7 g/dL (ref 6.0–8.5)
eGFR: 62 mL/min/{1.73_m2} (ref >59)

## 2023-05-28 LAB — SEDIMENTATION RATE: Sed Rate: 2 mm/h (ref 0–40)

## 2023-05-28 LAB — MULTIPLE MYELOMA PANEL, SERUM
Albumin SerPl Elph-Mcnc: 4.4 g/dL (ref 2.9–4.4)
Albumin/Glob SerPl: 1.7 (ref 0.7–1.7)
Alpha 1: 0.2 g/dL (ref 0.0–0.4)
Alpha2 Glob SerPl Elph-Mcnc: 0.7 g/dL (ref 0.4–1.0)
B-Globulin SerPl Elph-Mcnc: 1 g/dL (ref 0.7–1.3)
Gamma Glob SerPl Elph-Mcnc: 0.7 g/dL (ref 0.4–1.8)
Globulin, Total: 2.6 g/dL (ref 2.2–3.9)
IgA/Immunoglobulin A, Serum: 147 mg/dL (ref 64–422)
IgG (Immunoglobin G), Serum: 757 mg/dL (ref 586–1602)
IgM (Immunoglobulin M), Srm: 176 mg/dL (ref 26–217)

## 2023-05-28 LAB — HOMOCYSTEINE: Homocysteine: 19.6 umol/L (ref 0.0–21.3)

## 2023-05-28 LAB — HEMOGLOBIN A1C
Est. average glucose Bld gHb Est-mCnc: 114 mg/dL
Hgb A1c MFr Bld: 5.6 % (ref 4.8–5.6)

## 2023-05-28 LAB — METHYLMALONIC ACID, SERUM: Methylmalonic Acid: 329 nmol/L (ref 0–378)

## 2023-05-28 LAB — ANA W/REFLEX: ANA Titer 1: NEGATIVE

## 2023-05-28 LAB — GM1 IGG AUTOANTIBODIES

## 2023-05-28 LAB — MAG IGM ANTIBODIES

## 2023-05-28 LAB — C-REACTIVE PROTEIN: CRP: <1 mg/L (ref 0–10)

## 2023-05-28 LAB — MAG INTERPRETATION REFLEXED

## 2023-05-28 LAB — VITAMIN B6: Vitamin B6: 91.2 ug/L — ABNORMAL HIGH (ref 3.4–65.2)

## 2023-05-28 LAB — INTERPRETATION

## 2023-05-28 LAB — SJOGREN'S SYNDROME ANTIBODS(SSA + SSB)
ENA SSA (RO) Ab: <0.2 AI (ref 0.0–0.9)
ENA SSB (LA) Ab: <0.2 AI (ref 0.0–0.9)

## 2023-05-28 LAB — TSH: TSH: 1.32 u[IU]/mL (ref 0.450–4.500)

## 2023-05-28 LAB — VITAMIN B12: Vitamin B-12: 367 pg/mL (ref 232–1245)

## 2023-05-28 NOTE — Therapy (Signed)
 OUTPATIENT PHYSICAL THERAPY VESTIBULAR TREATMENT     Patient Name: Kara Tapia MRN: [id number] DOB:[birth year], 83 y.o., female Today's Date: 05/29/2023  END OF SESSION:  PT End of Session - 05/29/23 1354     Visit Number 2    Number of Visits 13    Date for PT Re-Evaluation 07/04/23    Authorization Type BCBS Medicare/[employer]    Progress Note Due on Visit 10    PT Start Time 1312    PT Stop Time 1357    PT Time Calculation (min) 45 min    Activity Tolerance Patient tolerated treatment well    Behavior During Therapy WFL for tasks assessed/performed              Past Medical History:  Diagnosis Date   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/2014   PVD (peripheral vascular disease) (HCC)    Reflux esophagitis    REM sleep behavior disorder 08/09/2014   Scotoma 08/09/2014   Sleep behavior disorder, REM 06/04/2017   Past Surgical History:  Procedure Laterality Date   CARPAL TUNNEL RELEASE Right    CATARACT EXTRACTION, BILATERAL Bilateral    Patient Active Problem List   Diagnosis Date Noted   Mixed hyperlipidemia 01/09/2023   Mild memory disturbance 01/09/2023   Medicare annual wellness visit, subsequent 01/09/2023   Gastroesophageal reflux disease without esophagitis 01/09/2023   Elevated blood pressure  reading 01/09/2023   Dizziness 01/09/2023   COPD mixed type (HCC) 01/09/2023   Hematuria 01/09/2023   Abnormal cervical Papanicolaou smear 01/09/2023   Senile osteoporosis 08/03/2021   Pain in joint of right shoulder 04/29/2019   Syncope 01/12/2019   Hypertension 01/12/2019   Lisping 12/02/2017   Sleep behavior disorder, REM 06/04/2017   MCI (mild cognitive impairment) 06/04/2017   Insomnia 07/06/2015   Nightmares REM-sleep type 07/06/2015   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   Acquired trigger finger 06/14/2015   Parasomnia, organic 10/04/2014   Migraine with status migrainosus 08/09/2014   Scotoma 08/09/2014   Intractable migraine with aura with status migrainosus 08/09/2014   REM sleep behavior disorder 08/09/2014    PCP: Melida Quitter, MD REFERRING PROVIDER: Dohmeier, Porfirio Mylar, MD   REFERRING DIAG:  G43.109 (ICD-10-CM) - Migraine aura occurring with and without headache  G47.52 (ICD-10-CM) - Sleep behavior disorder, REM  G31.84 (ICD-10-CM) - MCI (mild cognitive impairment)  H81.4 (ICD-10-CM) - Vertigo of central origin  H81.90 (ICD-10-CM) - Vestibulopathy, unspecified laterality    THERAPY DIAG:  Unsteadiness on feet  Dizziness and giddiness  ONSET DATE: 05/21/2023 MD referral  Rationale for Evaluation and Treatment: Rehabilitation  SUBJECTIVE:   SUBJECTIVE STATEMENT: Good, a little dizzy. Reports "I feel a little bit better." Has been walking outside the past couple days.  Pt accompanied by: self  PERTINENT HISTORY: Per Dr. Vickey Huger note:  recent optic migraine spells and beginning on 22 January a migraine that lasted about 10 minutes triggered by rapid movement, she had said to repeat because she felt otherwise at risk of falling.  On 1-23 another migraine this time 25 minutes long while working in the kitchen and standing she had to sit.  On 127 50 minutes by painting.  On 129 about 2 PM while  reading, duration not stated.  On February 14 she has another 1 with a rapid movement on February 15 while walking into her kitchen at 7 pM.  On February 16 at 715 movements triggered this 1 as well on February 24 while driving and she had to take a break.  On February 25 which was yesterday while watching TV at 7.  She also gave me the correlating list of blood pressures  PMH of RBD, MC, lumbar radiculopathy, areflexia at patella level, hx of vertigo and optical migraine  PAIN:  Are you having pain? No  PRECAUTIONS: Fall  RED FLAGS: None   WEIGHT BEARING RESTRICTIONS: No  FALLS: Has patient fallen in last 6 months? Yes. Number of falls 1  LIVING ENVIRONMENT: Lives with: lives alone Lives in: House/apartment Stairs: Yes: Internal: basement and upstairs; pt doesn't go up or down these often steps; outdoor steps (4) and handrail Has following equipment at home: None  PLOF: Independent and Leisure: enjoys yardwork  PATIENT GOALS:  Pt's goals for therapy are to get rid of the dizziness and help with the ability to read without dizziness  OBJECTIVE:       TODAY'S TREATMENT: 05/29/23    Orthostatic Testing   Supine Sitting Standing  x1 Minute Standing x 3 Minutes  BP 145/92 mmHg 131/93 *dizzy 95/73 *dizzy 88/68 *dizzy  HR 61 bpm  77 87 67  *performed on L UE   POSITIONAL TESTING:  Right Roll Test: negative Left Roll Test: L upbeating torsional nystagmus persisting  Right Dix-Hallpike: R upbeating torsional nystagmus lasting ~10 sec but persisting dizziness  Left Dix-Hallpike: L upbeating torsional nystagmus persisting; no improvement in sx the longer position was held     Activity Comments  sitting head turns to targets 30" Pt reports "a little more dizzy"  sitting head nods to targets 30" Pt reports "more dizzy. I'd rather not do it."     HOME EXERCISE PROGRAM Last updated: 05/29/23 Access Code: [email or web address] URL: [email or web address] Date:  05/29/2023 Prepared by: [facility] - Outpatient  Rehab - [facility]  Exercises - Supine to Left Sidelying Vestibular Habituation  - 1 x daily - 5 x weekly - 2 sets - 3 reps - Supine to Right Sidelying Vestibular Habituation  - 1 x daily - 5 x weekly - 2 sets - 3 reps - Seated Right Head Turns Vestibular Habituation  - 1 x daily - 5 x weekly - 2 sets - 10 reps    PATIENT EDUCATION: Education details: edu on exam findings- edu on increased hydration, use of compression socks, ankle pumps to address orhtostasis, HEP, edu on intended level of sx and goal of vestibular rehab Person educated: Patient Education method: Explanation, Demonstration, Tactile cues, Verbal cues, and Handouts Education comprehension: verbalized understanding and returned demonstration     Note: Objective measures were completed at Evaluation unless otherwise noted.  DIAGNOSTIC FINDINGS: upcoming MRI  COGNITION: Overall cognitive status: History of cognitive impairments - at baseline    POSTURE:  rounded shoulders and forward  head  Cervical ROM:    Active A/PROM (deg) eval  Flexion 22 with dizziness  Extension 31  Right lateral flexion   Left lateral flexion   Right rotation 33  Left rotation 35  (Blank rows = not tested) *generally swimmy headed with ROM testing*  GAIT: Gait pattern: WFL Distance walked: clinic distances Assistive device utilized: None Level of assistance: Complete Independence   PATIENT SURVEYS:  DHI 24  VESTIBULAR ASSESSMENT:  GENERAL OBSERVATION: Pt in no acute distress.   SYMPTOM BEHAVIOR:  Subjective history: Been having "holes in vision", headaches and dizziness, that have been going on for several years; worsening in prevalence in past 1-6 months.  Had virus in the past month, but it was not Covid  Non-Vestibular symptoms: headaches, migraine symptoms, and black spots/holes in vision  Type of dizziness: Imbalance (Disequilibrium), Unsteady with head/body  turns, Lightheadedness/Faint, and "drunk feeling"  Frequency: >3x/wk  Duration: minutes  Aggravating factors: Induced by motion: turning body quickly and turning head quickly and definite changes in light (going from bright outside light to indoors)  Relieving factors: lying supine, closing eyes, and rest  Progression of symptoms: worse  OCULOMOTOR EXAM:  Wears progressive lenses; blinks frequently and reports this is due to dry eyes  Ocular Alignment: normal  Ocular ROM: No Limitations  Spontaneous Nystagmus: absent  Gaze-Induced Nystagmus: absent  Smooth Pursuits: intact  Saccades: intact  Convergence/Divergence: 2 cm   VESTIBULAR - OCULAR REFLEX:   Slow VOR: Comment: horizontal 3/10 swimmy headed; no symptoms vertical  VOR Cancellation: Corrective Saccades with horizontal 6/10  Head-Impulse Test: HIT Right: positive HIT Left: positive Increased symptoms bilaterally  Dynamic Visual Acuity:  NT   POSITIONAL TESTING: Other: NT due to time constraints and pt does not report any spinning sensations  MOTION SENSITIVITY:NT  ORTHOSTATICS: Seated 125/86, HR 70; standing 90/71, HR 96 bpm    M-CTSIB  Condition 1: Firm Surface, EO 30 Sec, Mild Sway  Condition 2: Firm Surface, EC 30 Sec, Moderate Sway  Condition 3: Foam Surface, EO 30 Sec, Mild Sway  Condition 4: Foam Surface, EC 23Sec, Severe Sway                                                                                                                              TREATMENT DATE: 05/26/2023    PATIENT EDUCATION: Education details: Eval results, POC, rationale for vestibular rehab; recommendations for general activity level and hydration; educated on BP measures and orthostatic BP  Person educated: Patient Education method: Explanation Education comprehension: verbalized understanding  HOME EXERCISE PROGRAM:  GOALS: Goals reviewed with patient? Yes  SHORT TERM GOALS: Target date: 06/06/2023  Pt will be independent with  HEP for improved balance, decreased dizziness/headache symptoms. Baseline: Goal status: IN PROGRESS   LONG TERM GOALS: Target date: 07/04/2023  Pt will be independent with progression of HEP for improved balance, decreased dizziness and headache symptoms. Baseline:  Goal status: IN PROGRESS  2.  Pt will improve MCTSIB Condition 4 to 30 seconds with mod sway or less for improved balance and functional mobility. Baseline:  Goal status: IN PROGRESS  3.  FGA to be assessed with pt to score >/=22/30 for decreased fall risk Baseline: TBA Goal status: IN PROGRESS  4.  DHI score to improve to </= 20 to demo decreased dizziness. Baseline:  Goal status: IN PROGRESS  ASSESSMENT:  CLINICAL IMPRESSION: Patient arrived to session with report of some improvement in sx since last session. Orthostatic testing was positive; patient was educated on self-management at home. Will make referring provider aware. Positional testing was positive for nystagmus, however patient revealed nystagmus in most positions, without latency, small amplitude, and persisting. This does not necessarily align with BPPV, thus may be migraine-related positional nystagmus. Patient performed habituation activities with c/o more dizziness with head nods. Provided HEP with exercises that were fairly well tolerated today. Patient reported understanding and without complaints upon leaving.   OBJECTIVE IMPAIRMENTS: decreased balance, decreased mobility, decreased ROM, dizziness, postural dysfunction, pain, and headaches .   ACTIVITY LIMITATIONS: standing, transfers, locomotion level, and caring for others  PARTICIPATION LIMITATIONS: meal prep, cleaning, laundry, driving, shopping, and community activity  PERSONAL FACTORS: 3+ comorbidities: see above  are also affecting patient's functional outcome.   REHAB POTENTIAL: Good  CLINICAL DECISION MAKING: Stable/uncomplicated  EVALUATION COMPLEXITY: Low   PLAN:  PT FREQUENCY:  2x/week  PT DURATION: 6 weeks including eval week  PLANNED INTERVENTIONS: 97110-Therapeutic exercises, 97530- Therapeutic activity, 97112- Neuromuscular re-education, 97535- Self Care, 16010- Manual therapy, [phone]- Gait training, [phone]- Canalith repositioning, Patient/Family education, Balance training, and Vestibular training  PLAN FOR NEXT SESSION: pt is orthostatic- keep transitions slow; continue with habituation and may try to treat positional nystagmus to see if patient responds (although this may be migraine-related)    Baldemar Friday, PT, DPT 05/29/23 4:03 PM  [facility] at [facility] [address], [address] [place], [place] [address] Phone # [phone] Fax # [phone]

## 2023-05-28 NOTE — Telephone Encounter (Signed)
 Pt would like a call from the nurse to explain results of the blood test. Would like a call back.

## 2023-05-29 ENCOUNTER — Ambulatory Visit: Admitting: Physical Therapy

## 2023-05-29 ENCOUNTER — Telehealth: Payer: Self-pay | Admitting: Neurology

## 2023-05-29 ENCOUNTER — Encounter: Payer: Self-pay | Admitting: Physical Therapy

## 2023-05-29 ENCOUNTER — Telehealth: Payer: Self-pay | Admitting: Physical Therapy

## 2023-05-29 DIAGNOSIS — R2681 Unsteadiness on feet: Secondary | ICD-10-CM | POA: Diagnosis not present

## 2023-05-29 DIAGNOSIS — R42 Dizziness and giddiness: Secondary | ICD-10-CM | POA: Diagnosis not present

## 2023-05-29 MED ORDER — SUMATRIPTAN SUCCINATE 25 MG PO TABS: 25.0000 mg | ORAL_TABLET | ORAL | 0 refills | Status: DC | PRN

## 2023-05-29 NOTE — Telephone Encounter (Signed)
 I received a note from PT and agreed to treat your dizziness as vertigo first, also wrote for a migraine medication ( imitrex 25 mg ) that you can take if migraines occur, and repeat one time within 2 hours   Melvyn Novas, MD

## 2023-05-29 NOTE — Addendum Note (Signed)
 Addended by: Melvyn Novas on: 05/29/2023 04:59 PM   Modules accepted: Orders

## 2023-05-29 NOTE — Telephone Encounter (Signed)
 Hi Dr. Vickey Huger,  I saw Ms. Kara Tapia in OPPT today for dizziness per your referral. Please see orthostatic measures from 05/29/23 below as they may be contributing to her symptoms. Also wanted to make you aware that she presented with positional nystagmus that I suspect may be migraine-related, however we can try to treat for BPPV and just see if she responds. I wanted to know if you usually prescribe migraine meds for central positional dizziness?             Orthostatic Testing    Supine Sitting Standing  x1 Minute Standing x 3 Minutes  BP 145/92 mmHg 131/93 *dizzy 95/73 *dizzy 88/68 *dizzy  HR 61 bpm  77 87 67  *performed on L UE      Thanks!   Baldemar Friday, PT, DPT 05/29/23 4:14 PM  [facility] at [facility] [address] [place], [address] [place], [place] [address] Phone # [phone] Fax # [phone]

## 2023-05-29 NOTE — Telephone Encounter (Signed)
 I didn't see nystagmus here in the office visit, and I would be happy to provide a migraine medication for her .   Also orthostatic BP were obtained in the office visit , these didn't transfer into my visit note.  However the written note from the CMA indicated to drop in BP or change in HR,   And: Yes, 'please treat for BPV. !  Melvyn Novas, MD

## 2023-05-29 NOTE — Telephone Encounter (Signed)
 Called the patient and there was no answer. LVM and instructed to call back with any questions.

## 2023-06-02 ENCOUNTER — Encounter: Payer: Self-pay | Admitting: Physical Therapy

## 2023-06-02 ENCOUNTER — Ambulatory Visit: Admitting: Physical Therapy

## 2023-06-02 DIAGNOSIS — R2681 Unsteadiness on feet: Secondary | ICD-10-CM | POA: Diagnosis not present

## 2023-06-02 DIAGNOSIS — R42 Dizziness and giddiness: Secondary | ICD-10-CM

## 2023-06-02 NOTE — Therapy (Signed)
 OUTPATIENT PHYSICAL THERAPY VESTIBULAR TREATMENT     Patient Name: Kara Tapia MRN: [id number] DOB:[birth year], 83 y.o., female Today's Date: 06/02/2023  END OF SESSION:  PT End of Session - 06/02/23 1322     Visit Number 3    Number of Visits 13    Date for PT Re-Evaluation 07/04/23    Authorization Type [employer] Medicare/[employer]    Progress Note Due on Visit 10    PT Start Time 1319    PT Stop Time 1357    PT Time Calculation (min) 38 min    Activity Tolerance Patient tolerated treatment well    Behavior During Therapy WFL for tasks assessed/performed               Past Medical History:  Diagnosis Date   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/2014   PVD (peripheral vascular disease) (HCC)    Reflux esophagitis    REM sleep behavior disorder 08/09/2014   Scotoma 08/09/2014   Sleep behavior disorder, REM 06/04/2017   Past Surgical History:  Procedure Laterality Date   CARPAL TUNNEL RELEASE Right    CATARACT EXTRACTION, BILATERAL Bilateral    Patient Active Problem List   Diagnosis Date Noted   Mixed hyperlipidemia 01/09/2023   Mild memory disturbance 01/09/2023   Medicare annual wellness visit, subsequent 01/09/2023   Gastroesophageal reflux disease without esophagitis 01/09/2023   Elevated blood  pressure reading 01/09/2023   Dizziness 01/09/2023   COPD mixed type (HCC) 01/09/2023   Hematuria 01/09/2023   Abnormal cervical Papanicolaou smear 01/09/2023   Senile osteoporosis 08/03/2021   Pain in joint of right shoulder 04/29/2019   Syncope 01/12/2019   Hypertension 01/12/2019   Lisping 12/02/2017   Sleep behavior disorder, REM 06/04/2017   MCI (mild cognitive impairment) 06/04/2017   Insomnia 07/06/2015   Nightmares REM-sleep type 07/06/2015   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   Acquired trigger finger 06/14/2015   Parasomnia, organic 10/04/2014   Migraine with status migrainosus 08/09/2014   Scotoma 08/09/2014   Intractable migraine with aura with status migrainosus 08/09/2014   REM sleep behavior disorder 08/09/2014    PCP: Melida Quitter, MD REFERRING PROVIDER: Dohmeier, Porfirio Mylar, MD   REFERRING DIAG:  G43.109 (ICD-10-CM) - Migraine aura occurring with and without headache  G47.52 (ICD-10-CM) - Sleep behavior disorder, REM  G31.84 (ICD-10-CM) - MCI (mild cognitive impairment)  H81.4 (ICD-10-CM) - Vertigo of central origin  H81.90 (ICD-10-CM) - Vestibulopathy, unspecified laterality    THERAPY DIAG:  Unsteadiness on feet  Dizziness and giddiness  ONSET DATE: 05/21/2023 MD referral  Rationale for Evaluation and Treatment: Rehabilitation  SUBJECTIVE:   SUBJECTIVE STATEMENT: Burgess Estelle was a full dizzy day.  Was doing okay prior to that; did do some walking the previous  several days. The rolling exercises are not bad.   Pt accompanied by: self  PERTINENT HISTORY: Per Dr. Vickey Huger note:  recent optic migraine spells and beginning on 22 January a migraine that lasted about 10 minutes triggered by rapid movement, she had said to repeat because she felt otherwise at risk of falling.  On 1-23 another migraine this time 25 minutes long while working in the kitchen and standing she had to sit.  On 127 50  minutes by painting.  On 129 about 2 PM while reading, duration not stated.  On February 14 she has another 1 with a rapid movement on February 15 while walking into her kitchen at 7 pM.  On February 16 at 715 movements triggered this 1 as well on February 24 while driving and she had to take a break.  On February 25 which was yesterday while watching TV at 7.  She also gave me the correlating list of blood pressures  PMH of RBD, MC, lumbar radiculopathy, areflexia at patella level, hx of vertigo and optical migraine  PAIN:  Are you having pain? No  PRECAUTIONS: Fall  RED FLAGS: None   WEIGHT BEARING RESTRICTIONS: No  FALLS: Has patient fallen in last 6 months? Yes. Number of falls 1  LIVING ENVIRONMENT: Lives with: lives alone Lives in: House/apartment Stairs: Yes: Internal: basement and upstairs; pt doesn't go up or down these often steps; outdoor steps (4) and handrail Has following equipment at home: None  PLOF: Independent and Leisure: enjoys yardwork  PATIENT GOALS:  Pt's goals for therapy are to get rid of the dizziness and help with the ability to read without dizziness  OBJECTIVE:     TODAY'S TREATMENT: 06/02/2023 Activity Comments  LDH Mild dizzy, no nystagmus noted  R DH No symptoms, no nystagmus  R roll, L roll Both negative  Reviewed seated head turns as HEP, 30 sec x 2 No symptoms 1st trial (slow) No symptoms 2nd trial (slightly faster) Cues to not stop in the middle  Seated vitals 95/69 HR 65  bpm  Seated head nods x 30 sec 2/10 dizziness   Standing vitals  81/62, HR 67 bpm  Standing with chair support, head turns x 30 sec,  head nods x 30 sec  No symptoms 5/10 unsteadiness    HOME EXERCISE PROGRAM Access Code: [email or web address] URL: [email or web address] Date: 06/02/2023 Prepared by: [facility] - Outpatient  Rehab - [facility]  Exercises - Supine to Left Sidelying Vestibular Habituation  - 1 x daily - 5 x weekly - 2 sets - 3 reps - Supine  to Right Sidelying Vestibular Habituation  - 1 x daily - 5 x weekly - 2 sets - 3 reps - Seated Right Head Turns Vestibular Habituation  - 1 x daily - 5 x weekly - 2 sets - 10 reps - Seated Head Nods Vestibular Habituation  - 1 x daily - 7 x weekly - 2 sets - 10 reps    PATIENT EDUCATION: Education details: Positional testing results; progression of exercises based on pt's improved symptoms today; pt to follow up with PCP about BP measures Person educated: Patient Education method: Explanation, Demonstration, Tactile cues, Verbal cues, and Handouts Education comprehension: verbalized understanding and returned demonstration   ------------------------------------------------  Note: Objective measures were completed at Evaluation unless otherwise noted.  DIAGNOSTIC FINDINGS: upcoming MRI  COGNITION: Overall cognitive status: History of cognitive impairments - at baseline    POSTURE:  rounded shoulders and forward head  Cervical ROM:  Active A/PROM (deg) eval  Flexion 22 with dizziness  Extension 31  Right lateral flexion   Left lateral flexion   Right rotation 33  Left rotation 35  (Blank rows = not tested) *generally swimmy headed with ROM testing*  GAIT: Gait pattern: WFL Distance walked: clinic distances Assistive device utilized: None Level of assistance: Complete Independence   PATIENT SURVEYS:  DHI 24  VESTIBULAR ASSESSMENT:  GENERAL OBSERVATION: Pt in no acute distress.   SYMPTOM BEHAVIOR:  Subjective history: Been having "holes in vision", headaches and dizziness, that have been going on for several years; worsening in prevalence in past 1-6 months.  Had virus in the past month, but it was not Covid  Non-Vestibular symptoms: headaches, migraine symptoms, and black spots/holes in vision  Type of dizziness: Imbalance (Disequilibrium), Unsteady with head/body turns, Lightheadedness/Faint, and "drunk feeling"  Frequency: >3x/wk  Duration:  minutes  Aggravating factors: Induced by motion: turning body quickly and turning head quickly and definite changes in light (going from bright outside light to indoors)  Relieving factors: lying supine, closing eyes, and rest  Progression of symptoms: worse  OCULOMOTOR EXAM:  Wears progressive lenses; blinks frequently and reports this is due to dry eyes  Ocular Alignment: normal  Ocular ROM: No Limitations  Spontaneous Nystagmus: absent  Gaze-Induced Nystagmus: absent  Smooth Pursuits: intact  Saccades: intact  Convergence/Divergence: 2 cm   VESTIBULAR - OCULAR REFLEX:   Slow VOR: Comment: horizontal 3/10 swimmy headed; no symptoms vertical  VOR Cancellation: Corrective Saccades with horizontal 6/10  Head-Impulse Test: HIT Right: positive HIT Left: positive Increased symptoms bilaterally  Dynamic Visual Acuity:  NT   POSITIONAL TESTING: Other: NT due to time constraints and pt does not report any spinning sensations  MOTION SENSITIVITY:NT  ORTHOSTATICS: Seated 125/86, HR 70; standing 90/71, HR 96 bpm    M-CTSIB  Condition 1: Firm Surface, EO 30 Sec, Mild Sway  Condition 2: Firm Surface, EC 30 Sec, Moderate Sway  Condition 3: Foam Surface, EO 30 Sec, Mild Sway  Condition 4: Foam Surface, EC 23Sec, Severe Sway                                                                                                                              TREATMENT DATE: 05/26/2023    PATIENT EDUCATION: Education details: Eval results, POC, rationale for vestibular rehab; recommendations for general activity level and hydration; educated on BP measures and orthostatic BP  Person educated: Patient Education method: Explanation Education comprehension: verbalized understanding  HOME EXERCISE PROGRAM:  GOALS: Goals reviewed with patient? Yes  SHORT TERM GOALS: Target date: 06/06/2023  Pt will be independent with HEP for improved balance, decreased dizziness/headache symptoms. Baseline: Goal  status: IN PROGRESS   LONG TERM GOALS: Target date: 07/04/2023  Pt will be independent with progression of HEP for improved balance, decreased dizziness and headache symptoms. Baseline:  Goal status: IN PROGRESS  2.  Pt will improve MCTSIB Condition 4 to  30 seconds with mod sway or less for improved balance and functional mobility. Baseline:  Goal status: IN PROGRESS  3.  FGA to be assessed with pt to score >/=22/30 for decreased fall risk Baseline: TBA Goal status: IN PROGRESS  4.  DHI score to improve to </= 20 to demo decreased dizziness. Baseline:  Goal status: IN PROGRESS  ASSESSMENT:  CLINICAL IMPRESSION: Pt presents today reporting improved symptoms and improved walking for exercise, with exception of dizziness exacerbation yesterday throughout the day.  Skilled PT session focused on reassessing positional vertigo; no symptoms and no nystagmus noted with testing today.  Reviewed HEP and pt reports rolling is going well without symptoms; head turns in sitting does not bring on symptoms today.  Did add vertical head  motions in sitting, as pt did rate symptoms as 2/10.  With transition to standing, pt's BP decreases from 95/69 to 81/62 with c/o wooziness. Pt plans to follow up with her PCP about these measures, as these BP measures are limiting more standing and gait progression at this time.. Pt will continue to benefit from skilled PT towards goals for improved functional mobility and decreased fall risk.  OBJECTIVE IMPAIRMENTS: decreased balance, decreased mobility, decreased ROM, dizziness, postural dysfunction, pain, and headaches .   ACTIVITY LIMITATIONS: standing, transfers, locomotion level, and caring for others  PARTICIPATION LIMITATIONS: meal prep, cleaning, laundry, driving, shopping, and community activity  PERSONAL FACTORS: 3+ comorbidities: see above  are also affecting patient's functional outcome.   REHAB POTENTIAL: Good  CLINICAL DECISION MAKING:  Stable/uncomplicated  EVALUATION COMPLEXITY: Low   PLAN:  PT FREQUENCY: 2x/week  PT DURATION: 6 weeks including eval week  PLANNED INTERVENTIONS: 97110-Therapeutic exercises, 97530- Therapeutic activity, [id number]- Neuromuscular re-education, 97535- Self Care, [id number]- Manual therapy, [id number]- Gait training, [phone]- Canalith repositioning, Patient/Family education, Balance training, and Vestibular training  PLAN FOR NEXT SESSION: Review additions to HEP; ask about pt f/u with PCP about BP.  Pt is orthostatic- keep transitions slow; continue with habituation and may try to treat positional nystagmus to see if patient responds (although this may be migraine-related)    Lonia Blood, PT 06/02/23 1:59 PM Phone: [phone] Fax: [phone]  [facility] at [facility] [address] [place], [place] [address] Phone # [phone] Fax # [phone]

## 2023-06-03 ENCOUNTER — Ambulatory Visit (HOSPITAL_COMMUNITY)
Admission: RE | Admit: 2023-06-03 | Discharge: 2023-06-03 | Disposition: A | Discharge status: Home / Self Care | Source: Ambulatory Visit | Attending: Neurology | Admitting: Neurology

## 2023-06-03 DIAGNOSIS — G43109 Migraine with aura, not intractable, without status migrainosus: Secondary | ICD-10-CM | POA: Insufficient documentation

## 2023-06-03 DIAGNOSIS — H8190 Unspecified disorder of vestibular function, unspecified ear: Secondary | ICD-10-CM | POA: Insufficient documentation

## 2023-06-03 DIAGNOSIS — G4752 REM sleep behavior disorder: Secondary | ICD-10-CM | POA: Insufficient documentation

## 2023-06-03 DIAGNOSIS — G3184 Mild cognitive impairment, so stated: Secondary | ICD-10-CM | POA: Insufficient documentation

## 2023-06-03 DIAGNOSIS — H814 Vertigo of central origin: Secondary | ICD-10-CM | POA: Diagnosis not present

## 2023-06-03 NOTE — Therapy (Signed)
 OUTPATIENT PHYSICAL THERAPY VESTIBULAR TREATMENT     Patient Name: Kara Tapia MRN: [id number] DOB:[birth year], 83 y.o., female Today's Date: 06/05/2023  END OF SESSION:  PT End of Session - 06/05/23 1440     Visit Number 4    Number of Visits 13    Date for PT Re-Evaluation 07/04/23    Authorization Type [employer] Medicare/[employer]    Progress Note Due on Visit 10    PT Start Time 1359    PT Stop Time 1443    PT Time Calculation (min) 44 min    Equipment Utilized During Treatment Gait belt    Activity Tolerance Patient tolerated treatment well    Behavior During Therapy WFL for tasks assessed/performed                Past Medical History:  Diagnosis Date   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/2014   PVD (peripheral vascular disease) (HCC)    Reflux esophagitis    REM sleep behavior disorder 08/09/2014   Scotoma 08/09/2014   Sleep behavior disorder, REM 06/04/2017   Past Surgical History:  Procedure Laterality Date   CARPAL TUNNEL RELEASE Right    CATARACT EXTRACTION, BILATERAL Bilateral    Patient Active Problem List   Diagnosis Date Noted   Mixed hyperlipidemia 01/09/2023   Mild memory disturbance 01/09/2023   Medicare annual wellness visit, subsequent 01/09/2023   Gastroesophageal reflux disease  without esophagitis 01/09/2023   Elevated blood pressure reading 01/09/2023   Dizziness 01/09/2023   COPD mixed type (HCC) 01/09/2023   Hematuria 01/09/2023   Abnormal cervical Papanicolaou smear 01/09/2023   Senile osteoporosis 08/03/2021   Pain in joint of right shoulder 04/29/2019   Syncope 01/12/2019   Hypertension 01/12/2019   Lisping 12/02/2017   Sleep behavior disorder, REM 06/04/2017   MCI (mild cognitive impairment) 06/04/2017   Insomnia 07/06/2015   Nightmares REM-sleep type 07/06/2015   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   Acquired trigger finger 06/14/2015   Parasomnia, organic 10/04/2014   Migraine with status migrainosus 08/09/2014   Scotoma 08/09/2014   Intractable migraine with aura with status migrainosus 08/09/2014   REM sleep behavior disorder 08/09/2014    PCP: Melida Quitter, MD REFERRING PROVIDER: Dohmeier, Porfirio Mylar, MD   REFERRING DIAG:  G43.109 (ICD-10-CM) - Migraine aura occurring with and without headache  G47.52 (ICD-10-CM) - Sleep behavior disorder, REM  G31.84 (ICD-10-CM) - MCI (mild cognitive impairment)  H81.4 (ICD-10-CM) - Vertigo of central origin  H81.90 (ICD-10-CM) - Vestibulopathy, unspecified laterality    THERAPY DIAG:  Unsteadiness on feet  Dizziness and giddiness  ONSET DATE: 05/21/2023 MD referral  Rationale for Evaluation and Treatment: Rehabilitation  SUBJECTIVE:   SUBJECTIVE STATEMENT: Reports that dizziness the last couple of days has  been moderate. Getting through the exercises easily. Reports that she spoke to her PCP about her BP- they stopped amlodipine and BP has stabilized.    Pt accompanied by: self  PERTINENT HISTORY: Per Dr. Vickey Huger note:  recent optic migraine spells and beginning on 22 January a migraine that lasted about 10 minutes triggered by rapid movement, she had said to repeat because she felt otherwise at risk of falling.  On 1-23 another  migraine this time 25 minutes long while working in the kitchen and standing she had to sit.  On 127 50 minutes by painting.  On 129 about 2 PM while reading, duration not stated.  On February 14 she has another 1 with a rapid movement on February 15 while walking into her kitchen at 7 pM.  On February 16 at 715 movements triggered this 1 as well on February 24 while driving and she had to take a break.  On February 25 which was yesterday while watching TV at 7.  She also gave me the correlating list of blood pressures  PMH of RBD, MC, lumbar radiculopathy, areflexia at patella level, hx of vertigo and optical migraine  PAIN:  Are you having pain? No  PRECAUTIONS: Fall  RED FLAGS: None   WEIGHT BEARING RESTRICTIONS: No  FALLS: Has patient fallen in last 6 months? Yes. Number of falls 1  LIVING ENVIRONMENT: Lives with: lives alone Lives in: House/apartment Stairs: Yes: Internal: basement and upstairs; pt doesn't go up or down these often steps; outdoor steps (4) and handrail Has following equipment at home: None  PLOF: Independent and Leisure: enjoys yardwork  PATIENT GOALS:  Pt's goals for therapy are to get rid of the dizziness and help with the ability to read without dizziness  OBJECTIVE:     TODAY'S TREATMENT: 06/05/23 Activity Comments  Vitals at start of session 129/98 mmHg, 88bpm      standing head turns to targets 30" No dizziness   standing head nods to targets 30" Report of slight dizziness with more extended head position  R roll negative  L roll Possible few beats L upbeating nystagmus; difficult to visualize d/t repeated blinking   R sidelying Negative; c/o wooziness upon sitting up  L sidelying  Negative; c/o wooziness upon sitting up  1/2 turns to targets  In doorway; cueing for quickness ; c/o some imbalance  1/2 turns to targets + alt toe tap on cone  CGA ; slower and more cautious turns   D2 flexion to cone on floor 2x5  CGA; 2nd set at quicker pace        PATIENT EDUCATION: Education details: HEP update with edu for safety Person educated: Patient Education method: Explanation, Demonstration, Tactile cues, Verbal cues, and Handouts Education comprehension: verbalized understanding and returned demonstration    HOME EXERCISE PROGRAM Access Code: [facility] URL: [email or web address] Date: 06/05/2023 Prepared by: [facility] - Outpatient  Rehab - [facility]  Exercises - Standing with Head Rotation  - 1 x daily - 5 x weekly - 2-3 sets - 30 sec hold - Standing with Head Nod  - 1 x daily - 5 x weekly - 2-3 sets - 30 sec hold - 180 Degree Pivot Turn with Single Point Cane  - 1 x daily - 5 x weekly - 2 sets - 10 reps     ------------------------------------------------  Note: Objective measures were completed at Evaluation unless otherwise noted.  DIAGNOSTIC FINDINGS: upcoming MRI  COGNITION: Overall cognitive status: History of cognitive impairments -  at baseline    POSTURE:  rounded shoulders and forward head  Cervical ROM:    Active A/PROM (deg) eval  Flexion 22 with dizziness  Extension 31  Right lateral flexion   Left lateral flexion   Right rotation 33  Left rotation 35  (Blank rows = not tested) *generally swimmy headed with ROM testing*  GAIT: Gait pattern: WFL Distance walked: clinic distances Assistive device utilized: None Level of assistance: Complete Independence   PATIENT SURVEYS:  DHI 24  VESTIBULAR ASSESSMENT:  GENERAL OBSERVATION: Pt in no acute distress.   SYMPTOM BEHAVIOR:  Subjective history: Been having "holes in vision", headaches and dizziness, that have been going on for several years; worsening in prevalence in past 1-6 months.  Had virus in the past month, but it was not Covid  Non-Vestibular symptoms: headaches, migraine symptoms, and black spots/holes in vision  Type of dizziness: Imbalance (Disequilibrium), Unsteady with head/body turns,  Lightheadedness/Faint, and "drunk feeling"  Frequency: >3x/wk  Duration: minutes  Aggravating factors: Induced by motion: turning body quickly and turning head quickly and definite changes in light (going from bright outside light to indoors)  Relieving factors: lying supine, closing eyes, and rest  Progression of symptoms: worse  OCULOMOTOR EXAM:  Wears progressive lenses; blinks frequently and reports this is due to dry eyes  Ocular Alignment: normal  Ocular ROM: No Limitations  Spontaneous Nystagmus: absent  Gaze-Induced Nystagmus: absent  Smooth Pursuits: intact  Saccades: intact  Convergence/Divergence: 2 cm   VESTIBULAR - OCULAR REFLEX:   Slow VOR: Comment: horizontal 3/10 swimmy headed; no symptoms vertical  VOR Cancellation: Corrective Saccades with horizontal 6/10  Head-Impulse Test: HIT Right: positive HIT Left: positive Increased symptoms bilaterally  Dynamic Visual Acuity:  NT   POSITIONAL TESTING: Other: NT due to time constraints and pt does not report any spinning sensations  MOTION SENSITIVITY:NT  ORTHOSTATICS: Seated 125/86, HR 70; standing 90/71, HR 96 bpm    M-CTSIB  Condition 1: Firm Surface, EO 30 Sec, Mild Sway  Condition 2: Firm Surface, EC 30 Sec, Moderate Sway  Condition 3: Foam Surface, EO 30 Sec, Mild Sway  Condition 4: Foam Surface, EC 23Sec, Severe Sway                                                                                                                              TREATMENT DATE: 05/26/2023    PATIENT EDUCATION: Education details: Eval results, POC, rationale for vestibular rehab; recommendations for general activity level and hydration; educated on BP measures and orthostatic BP  Person educated: Patient Education method: Explanation Education comprehension: verbalized understanding  HOME EXERCISE PROGRAM:  GOALS: Goals reviewed with patient? Yes  SHORT TERM GOALS: Target date: 06/06/2023  Pt will be independent with HEP  for improved balance, decreased dizziness/headache symptoms. Baseline: Goal status: IN PROGRESS   LONG TERM GOALS: Target date: 07/04/2023  Pt will be independent with progression of HEP for improved balance, decreased dizziness and  headache symptoms. Baseline:  Goal status: IN PROGRESS  2.  Pt will improve MCTSIB Condition 4 to 30 seconds with mod sway or less for improved balance and functional mobility. Baseline:  Goal status: IN PROGRESS  3.  FGA to be assessed with pt to score >/=22/30 for decreased fall risk Baseline: TBA Goal status: IN PROGRESS  4.  DHI score to improve to </= 20 to demo decreased dizziness. Baseline:  Goal status: IN PROGRESS  ASSESSMENT:  CLINICAL IMPRESSION: Patient arrived to session with report of improvement in BP since stopping BP meds as directed by PCP. Vitals at start of session revealed slightly elevated diastolic BP but patient asymptomatic. Reviewed habituation for head movements- patient was able to perform these with minimal sx with head nods.  Re-test of positional testing was grossly negative, however patient with c/o wooziness upon sitting up. Feel that this could still be related to effects of orthostasis vs. motion sensitivity. HEP was updated with exercises that were well-tolerated today. No complaints at end of session.  OBJECTIVE IMPAIRMENTS: decreased balance, decreased mobility, decreased ROM, dizziness, postural dysfunction, pain, and headaches .   ACTIVITY LIMITATIONS: standing, transfers, locomotion level, and caring for others  PARTICIPATION LIMITATIONS: meal prep, cleaning, laundry, driving, shopping, and community activity  PERSONAL FACTORS: 3+ comorbidities: see above  are also affecting patient's functional outcome.   REHAB POTENTIAL: Good  CLINICAL DECISION MAKING: Stable/uncomplicated  EVALUATION COMPLEXITY: Low   PLAN:  PT FREQUENCY: 2x/week  PT DURATION: 6 weeks including eval week  PLANNED INTERVENTIONS:  97110-Therapeutic exercises, 97530- Therapeutic activity, 97112- Neuromuscular re-education, 97535- Self Care, 56213- Manual therapy, [phone]- Gait training, [phone]- Canalith repositioning, Patient/Family education, Balance training, and Vestibular training  PLAN FOR NEXT SESSION: Review additions to HEP; Pt is orthostatic- keep transitions slow; continue with habituation and may try to treat positional nystagmus to see if patient responds (although this may be migraine-related)    Baldemar Friday, PT, DPT 06/05/23 2:45 PM  [facility] [address], [address] [place], [place] [address] Phone # [phone] Fax # [phone]

## 2023-06-04 ENCOUNTER — Encounter: Payer: Self-pay | Admitting: *Deleted

## 2023-06-04 ENCOUNTER — Encounter: Payer: Self-pay | Admitting: Neurology

## 2023-06-04 NOTE — Telephone Encounter (Signed)
 Pt is asking to be called for the scheduling of her MRI since she has received a letter stating she has been approved coverage for the scan

## 2023-06-05 ENCOUNTER — Encounter: Payer: Self-pay | Admitting: Physical Therapy

## 2023-06-05 ENCOUNTER — Ambulatory Visit: Admitting: Physical Therapy

## 2023-06-05 DIAGNOSIS — R2681 Unsteadiness on feet: Secondary | ICD-10-CM

## 2023-06-05 DIAGNOSIS — R42 Dizziness and giddiness: Secondary | ICD-10-CM

## 2023-06-09 ENCOUNTER — Ambulatory Visit

## 2023-06-10 ENCOUNTER — Ambulatory Visit

## 2023-06-10 DIAGNOSIS — R42 Dizziness and giddiness: Secondary | ICD-10-CM

## 2023-06-10 DIAGNOSIS — R2681 Unsteadiness on feet: Secondary | ICD-10-CM | POA: Diagnosis not present

## 2023-06-10 NOTE — Therapy (Signed)
 OUTPATIENT PHYSICAL THERAPY VESTIBULAR TREATMENT     Patient Name: Kara Tapia MRN: [id number] DOB:Jun 02, 1940, 83 y.o., female Today's Date: 06/10/2023  END OF SESSION:  PT End of Session - 06/10/23 1532     Visit Number 5    Number of Visits 13    Date for PT Re-Evaluation 07/04/23    Authorization Type [employer] Medicare/[employer]    Progress Note Due on Visit 10    PT Start Time 1532    PT Stop Time 1615    PT Time Calculation (min) 43 min    Equipment Utilized During Treatment Gait belt    Activity Tolerance Patient tolerated treatment well    Behavior During Therapy WFL for tasks assessed/performed                Past Medical History:  Diagnosis Date   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/2014   PVD (peripheral vascular disease) (HCC)    Reflux esophagitis    REM sleep behavior disorder 08/09/2014   Scotoma 08/09/2014   Sleep behavior disorder, REM 06/04/2017   Past Surgical History:  Procedure Laterality Date   CARPAL TUNNEL RELEASE Right    CATARACT EXTRACTION, BILATERAL Bilateral    Patient Active Problem List   Diagnosis Date Noted   Mixed hyperlipidemia 01/09/2023   Mild memory disturbance 01/09/2023   Medicare annual wellness visit, subsequent 01/09/2023   Gastroesophageal reflux disease  without esophagitis 01/09/2023   Elevated blood pressure reading 01/09/2023   Dizziness 01/09/2023   COPD mixed type (HCC) 01/09/2023   Hematuria 01/09/2023   Abnormal cervical Papanicolaou smear 01/09/2023   Senile osteoporosis 08/03/2021   Pain in joint of right shoulder 04/29/2019   Syncope 01/12/2019   Hypertension 01/12/2019   Lisping 12/02/2017   Sleep behavior disorder, REM 06/04/2017   MCI (mild cognitive impairment) 06/04/2017   Insomnia 07/06/2015   Nightmares REM-sleep type 07/06/2015   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   Acquired trigger finger 06/14/2015   Parasomnia, organic 10/04/2014   Migraine with status migrainosus 08/09/2014   Scotoma 08/09/2014   Intractable migraine with aura with status migrainosus 08/09/2014   REM sleep behavior disorder 08/09/2014    PCP: Melida Quitter, MD REFERRING PROVIDER: Dohmeier, Porfirio Mylar, MD   REFERRING DIAG:  G43.109 (ICD-10-CM) - Migraine aura occurring with and without headache  G47.52 (ICD-10-CM) - Sleep behavior disorder, REM  G31.84 (ICD-10-CM) - MCI (mild cognitive impairment)  H81.4 (ICD-10-CM) - Vertigo of central origin  H81.90 (ICD-10-CM) - Vestibulopathy, unspecified laterality    THERAPY DIAG:  Unsteadiness on feet  Dizziness and giddiness  ONSET DATE: 05/21/2023 MD referral  Rationale for Evaluation and Treatment: Rehabilitation  SUBJECTIVE:   SUBJECTIVE STATEMENT: Feeling out of sorts yesterday when leaving house. The  bright light seems to aggravate the symptoms and when exiting home to outdoors too quickly this triggers.  Yesterday felt dizziness/imbalance   Pt accompanied by: self  PERTINENT HISTORY: Per Dr. Vickey Huger note:  recent optic migraine spells and beginning on 22 January a migraine that lasted about 10 minutes triggered by rapid movement, she had said to repeat because she felt otherwise at risk of falling.  On 1-23 another  migraine this time 25 minutes long while working in the kitchen and standing she had to sit.  On 127 50 minutes by painting.  On 129 about 2 PM while reading, duration not stated.  On February 14 she has another 1 with a rapid movement on February 15 while walking into her kitchen at 7 pM.  On February 16 at 715 movements triggered this 1 as well on February 24 while driving and she had to take a break.  On February 25 which was yesterday while watching TV at 7.  She also gave me the correlating list of blood pressures  PMH of RBD, MC, lumbar radiculopathy, areflexia at patella level, hx of vertigo and optical migraine  PAIN:  Are you having pain? No  PRECAUTIONS: Fall  RED FLAGS: None   WEIGHT BEARING RESTRICTIONS: No  FALLS: Has patient fallen in last 6 months? Yes. Number of falls 1  LIVING ENVIRONMENT: Lives with: lives alone Lives in: House/apartment Stairs: Yes: Internal: basement and upstairs; pt doesn't go up or down these often steps; outdoor steps (4) and handrail Has following equipment at home: None  PLOF: Independent and Leisure: enjoys yardwork  PATIENT GOALS:  Pt's goals for therapy are to get rid of the dizziness and help with the ability to read without dizziness  OBJECTIVE:   TODAY'S TREATMENT: 06/10/23 Activity Comments  Vitals seated: 116/79, 77 bpm  HEP review Standing horizontal head turns/nods x 30 sec: no issues -pivot turns: no issues  Forward-T at counter 5 reps, mostly symptomatic with arising  Repeated 1/2 turns With visual scanning  M-CTSIB Condition 3: mild Condition 4: mild-moderate          TODAY'S TREATMENT: 06/05/23 Activity Comments  Vitals at start of session 129/98 mmHg, 88bpm      standing head turns to targets 30" No dizziness   standing head nods to targets 30" Report of slight dizziness with more extended head position  R roll negative  L roll Possible few beats L upbeating nystagmus; difficult to visualize d/t repeated blinking    R sidelying Negative; c/o wooziness upon sitting up  L sidelying  Negative; c/o wooziness upon sitting up  1/2 turns to targets  In doorway; cueing for quickness ; c/o some imbalance  1/2 turns to targets + alt toe tap on cone  CGA ; slower and more cautious turns   D2 flexion to cone on floor 2x5  CGA; 2nd set at quicker pace       PATIENT EDUCATION: Education details: HEP update with edu for safety Person educated: Patient Education method: Explanation, Demonstration, Tactile cues, Verbal cues, and Handouts Education comprehension: verbalized understanding and returned demonstration    HOME EXERCISE PROGRAM Access Code: [facility] URL: [email or web address] Date: 06/05/2023 Prepared by: [facility] - Outpatient  Rehab - [facility]  Exercises - Standing with Head Rotation  - 1 x daily - 5 x weekly - 2-3 sets - 30 sec hold - Standing with Head Nod  - 1 x daily - 5 x weekly - 2-3 sets - 30 sec hold -  180 Degree Pivot Turn with Single Point Cane  - 1 x daily - 5 x weekly - 2 sets - 10 reps     ------------------------------------------------  Note: Objective measures were completed at Evaluation unless otherwise noted.  DIAGNOSTIC FINDINGS: upcoming MRI  COGNITION: Overall cognitive status: History of cognitive impairments - at baseline    POSTURE:  rounded shoulders and forward head  Cervical ROM:    Active A/PROM (deg) eval  Flexion 22 with dizziness  Extension 31  Right lateral flexion   Left lateral flexion   Right rotation 33  Left rotation 35  (Blank rows = not tested) *generally swimmy headed with ROM testing*  GAIT: Gait pattern: WFL Distance walked: clinic distances Assistive device utilized: None Level of assistance: Complete Independence   PATIENT SURVEYS:  DHI 24  VESTIBULAR ASSESSMENT:  GENERAL OBSERVATION: Pt in no acute distress.   SYMPTOM BEHAVIOR:  Subjective history: Been having "holes in vision", headaches and  dizziness, that have been going on for several years; worsening in prevalence in past 1-6 months.  Had virus in the past month, but it was not Covid  Non-Vestibular symptoms: headaches, migraine symptoms, and black spots/holes in vision  Type of dizziness: Imbalance (Disequilibrium), Unsteady with head/body turns, Lightheadedness/Faint, and "drunk feeling"  Frequency: >3x/wk  Duration: minutes  Aggravating factors: Induced by motion: turning body quickly and turning head quickly and definite changes in light (going from bright outside light to indoors)  Relieving factors: lying supine, closing eyes, and rest  Progression of symptoms: worse  OCULOMOTOR EXAM:  Wears progressive lenses; blinks frequently and reports this is due to dry eyes  Ocular Alignment: normal  Ocular ROM: No Limitations  Spontaneous Nystagmus: absent  Gaze-Induced Nystagmus: absent  Smooth Pursuits: intact  Saccades: intact  Convergence/Divergence: 2 cm   VESTIBULAR - OCULAR REFLEX:   Slow VOR: Comment: horizontal 3/10 swimmy headed; no symptoms vertical  VOR Cancellation: Corrective Saccades with horizontal 6/10  Head-Impulse Test: HIT Right: positive HIT Left: positive Increased symptoms bilaterally  Dynamic Visual Acuity:  NT   POSITIONAL TESTING: Other: NT due to time constraints and pt does not report any spinning sensations  MOTION SENSITIVITY:NT  ORTHOSTATICS: Seated 125/86, HR 70; standing 90/71, HR 96 bpm    M-CTSIB  Condition 1: Firm Surface, EO 30 Sec, Mild Sway  Condition 2: Firm Surface, EC 30 Sec, Moderate Sway  Condition 3: Foam Surface, EO 30 Sec, Mild Sway  Condition 4: Foam Surface, EC 23Sec, Severe Sway                                                                                                                              TREATMENT DATE: 05/26/2023    PATIENT EDUCATION: Education details: Eval results, POC, rationale for vestibular rehab; recommendations for general activity level  and hydration; educated on BP measures and orthostatic BP  Person educated: Patient Education method: Explanation Education comprehension: verbalized understanding  HOME EXERCISE PROGRAM:  GOALS: Goals reviewed with patient? Yes  SHORT TERM GOALS: Target date: 06/06/2023  Pt will be independent with HEP for improved balance, decreased dizziness/headache symptoms. Baseline: Goal status: MET   LONG TERM GOALS: Target date: 07/04/2023  Pt will be independent with progression of HEP for improved balance, decreased dizziness and headache symptoms. Baseline:  Goal status: IN PROGRESS  2.  Pt will improve MCTSIB Condition 4 to 30 seconds with mod sway or less for improved balance and functional mobility. Baseline: mild-moderate Goal status: MET  3.  FGA to be assessed with pt to score >/=22/30 for decreased fall risk Baseline: TBA Goal status: IN PROGRESS  4.  DHI score to improve to </= 20 to demo decreased dizziness. Baseline:  Goal status: IN PROGRESS  ASSESSMENT:  CLINICAL IMPRESSION: Pt reports overall marked improvements other than episode yesterday when exiting home to bright sunlight causing some migraine related symptoms and imbalance requiring daughter to assist going down stairs but this resolved by 11 AM.  Reports today much improved and demo good recall to HEP and notes little to no provocation.  Forward bending slightly provoking when arising. Discussed tenets and rationale of habituation. Able to manifest improved postural stability under condition 4 M-CTSIB mild-moderate sway x 30 sec. Anticipate timely D/C if symptoms resolved. OBJECTIVE IMPAIRMENTS: decreased balance, decreased mobility, decreased ROM, dizziness, postural dysfunction, pain, and headaches .   ACTIVITY LIMITATIONS: standing, transfers, locomotion level, and caring for others  PARTICIPATION LIMITATIONS: meal prep, cleaning, laundry, driving, shopping, and community activity  PERSONAL FACTORS: 3+  comorbidities: see above  are also affecting patient's functional outcome.   REHAB POTENTIAL: Good  CLINICAL DECISION MAKING: Stable/uncomplicated  EVALUATION COMPLEXITY: Low   PLAN:  PT FREQUENCY: 2x/week  PT DURATION: 6 weeks including eval week  PLANNED INTERVENTIONS: 97110-Therapeutic exercises, 97530- Therapeutic activity, [id number]- Neuromuscular re-education, 97535- Self Care, [id number]- Manual therapy, [phone]- Gait training, [phone]- Canalith repositioning, Patient/Family education, Balance training, and Vestibular training  PLAN FOR NEXT SESSION: continue with habituation and may try to treat positional nystagmus to see if patient responds (although this may be migraine-related)    4:19 PM, 06/10/23 M. Shary Decamp, PT, DPT Physical Therapist- [place] Office Number: [phone]

## 2023-06-12 ENCOUNTER — Ambulatory Visit

## 2023-06-12 DIAGNOSIS — R42 Dizziness and giddiness: Secondary | ICD-10-CM | POA: Diagnosis not present

## 2023-06-12 DIAGNOSIS — R2681 Unsteadiness on feet: Secondary | ICD-10-CM | POA: Diagnosis not present

## 2023-06-12 NOTE — Therapy (Signed)
 OUTPATIENT PHYSICAL THERAPY VESTIBULAR TREATMENT     Patient Name: Kara Tapia MRN: [id number] DOB:05/05/1940, 83 y.o., female Today's Date: 06/12/2023  END OF SESSION:  PT End of Session - 06/12/23 1313     Visit Number 6    Number of Visits 13    Date for PT Re-Evaluation 07/04/23    Authorization Type [employer] Medicare/[employer]    Progress Note Due on Visit 10    PT Start Time 1315    PT Stop Time 1400    PT Time Calculation (min) 45 min    Equipment Utilized During Treatment Gait belt    Activity Tolerance Patient tolerated treatment well    Behavior During Therapy WFL for tasks assessed/performed                Past Medical History:  Diagnosis Date   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/2014   PVD (peripheral vascular disease) (HCC)    Reflux esophagitis    REM sleep behavior disorder 08/09/2014   Scotoma 08/09/2014   Sleep behavior disorder, REM 06/04/2017   Past Surgical History:  Procedure Laterality Date   CARPAL TUNNEL RELEASE Right    CATARACT EXTRACTION, BILATERAL Bilateral    Patient Active Problem List   Diagnosis Date Noted   Mixed hyperlipidemia 01/09/2023   Mild memory disturbance 01/09/2023   Medicare annual wellness visit, subsequent 01/09/2023   Gastroesophageal reflux disease  without esophagitis 01/09/2023   Elevated blood pressure reading 01/09/2023   Dizziness 01/09/2023   COPD mixed type (HCC) 01/09/2023   Hematuria 01/09/2023   Abnormal cervical Papanicolaou smear 01/09/2023   Senile osteoporosis 08/03/2021   Pain in joint of right shoulder 04/29/2019   Syncope 01/12/2019   Hypertension 01/12/2019   Lisping 12/02/2017   Sleep behavior disorder, REM 06/04/2017   MCI (mild cognitive impairment) 06/04/2017   Insomnia 07/06/2015   Nightmares REM-sleep type 07/06/2015   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   Acquired trigger finger 06/14/2015   Parasomnia, organic 10/04/2014   Migraine with status migrainosus 08/09/2014   Scotoma 08/09/2014   Intractable migraine with aura with status migrainosus 08/09/2014   REM sleep behavior disorder 08/09/2014    PCP: Melida Quitter, MD REFERRING PROVIDER: Dohmeier, Porfirio Mylar, MD   REFERRING DIAG:  G43.109 (ICD-10-CM) - Migraine aura occurring with and without headache  G47.52 (ICD-10-CM) - Sleep behavior disorder, REM  G31.84 (ICD-10-CM) - MCI (mild cognitive impairment)  H81.4 (ICD-10-CM) - Vertigo of central origin  H81.90 (ICD-10-CM) - Vestibulopathy, unspecified laterality    THERAPY DIAG:  Unsteadiness on feet  Dizziness and giddiness  ONSET DATE: 05/21/2023 MD referral  Rationale for Evaluation and Treatment: Rehabilitation  SUBJECTIVE:   SUBJECTIVE STATEMENT: Been working outside past two days and notes sensation  of shadows and light when coming back indoors with feeling lasting 25 min of this visual distortion and feeling off balanced. Notes that laying down and shutting her eyes x 10 min helps to resolve.  Reports cleaning up in yard wasn't too bad wih bending forward for sticks.    Pt accompanied by: self  PERTINENT HISTORY: Per Dr. Vickey Huger note:  recent optic migraine spells and beginning on 22 January a migraine that lasted  about 10 minutes triggered by rapid movement, she had said to repeat because she felt otherwise at risk of falling.  On 1-23 another migraine this time 25 minutes long while working in the kitchen and standing she had to sit.  On 127 50 minutes by painting.  On 129 about 2 PM while reading, duration not stated.  On February 14 she has another 1 with a rapid movement on February 15 while walking into her kitchen at 7 pM.  On February 16 at 715 movements triggered this 1 as well on February 24 while driving and she had to take a break.  On February 25 which was yesterday while watching TV at 7.  She also gave me the correlating list of blood pressures  PMH of RBD, MC, lumbar radiculopathy, areflexia at patella level, hx of vertigo and optical migraine  PAIN:  Are you having pain? No  PRECAUTIONS: Fall  RED FLAGS: None   WEIGHT BEARING RESTRICTIONS: No  FALLS: Has patient fallen in last 6 months? Yes. Number of falls 1  LIVING ENVIRONMENT: Lives with: lives alone Lives in: House/apartment Stairs: Yes: Internal: basement and upstairs; pt doesn't go up or down these often steps; outdoor steps (4) and handrail Has following equipment at home: None  PLOF: Independent and Leisure: enjoys yardwork  PATIENT GOALS:  Pt's goals for therapy are to get rid of the dizziness and help with the ability to read without dizziness  OBJECTIVE:   TODAY'S TREATMENT: 06/12/23 Activity Comments  Functional Gait Assessment 22/30  Gait with head turns Use of cane and trekking poles                 [facility] PT Assessment - 06/12/23 0001       Functional Gait  Assessment   Gait assessed  Yes    Gait Level Surface Walks 20 ft in less than 5.5 sec, no assistive devices, good speed, no evidence for imbalance, normal gait pattern, deviates no more than 6 in outside of the 12 in walkway width.    Change in Gait Speed Able to smoothly change walking speed without loss of balance or gait deviation. Deviate no more  than 6 in outside of the 12 in walkway width.    Gait with Horizontal Head Turns Performs head turns with moderate changes in gait velocity, slows down, deviates 10-15 in outside 12 in walkway width but recovers, can continue to walk.    Gait with Vertical Head Turns Performs task with moderate change in gait velocity, slows down, deviates 10-15 in outside 12 in walkway width but recovers, can continue to walk.    Gait and Pivot Turn Pivot turns safely within 3 sec and stops quickly with no loss of balance.    Step Over Obstacle Is able to step over 2 stacked shoe boxes taped together (9 in total height) without changing gait speed. No evidence of imbalance.    Gait with Narrow Base of Support Is able to ambulate for 10 steps heel to toe with no staggering.  Gait with Eyes Closed Walks 20 ft, slow speed, abnormal gait pattern, evidence for imbalance, deviates 10-15 in outside 12 in walkway width. Requires more than 9 sec to ambulate 20 ft.    Ambulating Backwards Walks 20 ft, uses assistive device, slower speed, mild gait deviations, deviates 6-10 in outside 12 in walkway width.    Steps Alternating feet, no rail.    Total Score 22             TODAY'S TREATMENT: 06/10/23 Activity Comments  Vitals seated: 116/79, 77 bpm  HEP review Standing horizontal head turns/nods x 30 sec: no issues -pivot turns: no issues  Forward-T at counter 5 reps, mostly symptomatic with arising  Repeated 1/2 turns With visual scanning  M-CTSIB Condition 3: mild Condition 4: mild-moderate           PATIENT EDUCATION: Education details: HEP update with edu for safety Person educated: Patient Education method: Explanation, Demonstration, Tactile cues, Verbal cues, and Handouts Education comprehension: verbalized understanding and returned demonstration    HOME EXERCISE PROGRAM Access Code: [facility] URL: [email or web address] Date: 06/05/2023 Prepared by: [facility] - Outpatient  Rehab -  [facility]  Exercises - Standing with Head Rotation  - 1 x daily - 5 x weekly - 2-3 sets - 30 sec hold - Standing with Head Nod  - 1 x daily - 5 x weekly - 2-3 sets - 30 sec hold - 180 Degree Pivot Turn with Single Point Cane  - 1 x daily - 5 x weekly - 2 sets - 10 reps - Walking with Head Rotation  - 1 x daily - 7 x weekly - 3 sets - 10 reps    ------------------------------------------------  Note: Objective measures were completed at Evaluation unless otherwise noted.  DIAGNOSTIC FINDINGS: upcoming MRI  COGNITION: Overall cognitive status: History of cognitive impairments - at baseline    POSTURE:  rounded shoulders and forward head  Cervical ROM:    Active A/PROM (deg) eval  Flexion 22 with dizziness  Extension 31  Right lateral flexion   Left lateral flexion   Right rotation 33  Left rotation 35  (Blank rows = not tested) *generally swimmy headed with ROM testing*  GAIT: Gait pattern: WFL Distance walked: clinic distances Assistive device utilized: None Level of assistance: Complete Independence   PATIENT SURVEYS:  DHI 24  VESTIBULAR ASSESSMENT:  GENERAL OBSERVATION: Pt in no acute distress.   SYMPTOM BEHAVIOR:  Subjective history: Been having "holes in vision", headaches and dizziness, that have been going on for several years; worsening in prevalence in past 1-6 months.  Had virus in the past month, but it was not Covid  Non-Vestibular symptoms: headaches, migraine symptoms, and black spots/holes in vision  Type of dizziness: Imbalance (Disequilibrium), Unsteady with head/body turns, Lightheadedness/Faint, and "drunk feeling"  Frequency: >3x/wk  Duration: minutes  Aggravating factors: Induced by motion: turning body quickly and turning head quickly and definite changes in light (going from bright outside light to indoors)  Relieving factors: lying supine, closing eyes, and rest  Progression of symptoms: worse  OCULOMOTOR EXAM:  Wears  progressive lenses; blinks frequently and reports this is due to dry eyes  Ocular Alignment: normal  Ocular ROM: No Limitations  Spontaneous Nystagmus: absent  Gaze-Induced Nystagmus: absent  Smooth Pursuits: intact  Saccades: intact  Convergence/Divergence: 2 cm   VESTIBULAR - OCULAR REFLEX:   Slow VOR: Comment: horizontal 3/10 swimmy headed; no symptoms vertical  VOR Cancellation: Corrective Saccades with horizontal 6/10  Head-Impulse Test:  HIT Right: positive HIT Left: positive Increased symptoms bilaterally  Dynamic Visual Acuity:  NT   POSITIONAL TESTING: Other: NT due to time constraints and pt does not report any spinning sensations  MOTION SENSITIVITY:NT  ORTHOSTATICS: Seated 125/86, HR 70; standing 90/71, HR 96 bpm    M-CTSIB  Condition 1: Firm Surface, EO 30 Sec, Mild Sway  Condition 2: Firm Surface, EC 30 Sec, Moderate Sway  Condition 3: Foam Surface, EO 30 Sec, Mild Sway  Condition 4: Foam Surface, EC 23Sec, Severe Sway                                                                                                                              TREATMENT DATE: 05/26/2023    PATIENT EDUCATION: Education details: Eval results, POC, rationale for vestibular rehab; recommendations for general activity level and hydration; educated on BP measures and orthostatic BP  Person educated: Patient Education method: Explanation Education comprehension: verbalized understanding  HOME EXERCISE PROGRAM:  GOALS: Goals reviewed with patient? Yes  SHORT TERM GOALS: Target date: 06/06/2023  Pt will be independent with HEP for improved balance, decreased dizziness/headache symptoms. Baseline: Goal status: MET   LONG TERM GOALS: Target date: 07/04/2023  Pt will be independent with progression of HEP for improved balance, decreased dizziness and headache symptoms. Baseline:  Goal status: IN PROGRESS  2.  Pt will improve MCTSIB Condition 4 to 30 seconds with mod sway or less  for improved balance and functional mobility. Baseline: mild-moderate Goal status: MET  3.  FGA to be assessed with pt to score >/=22/30 for decreased fall risk Baseline: 22/30 Goal status: MET  4.  DHI score to improve to </= 20 to demo decreased dizziness. Baseline:  Goal status: IN PROGRESS  ASSESSMENT:  CLINICAL IMPRESSION: Functional Gait Assessment performed with score of 22/30 with most difficult item being walking with horizontal head turns requiring her to come to a near stop with each direction change.  Gait trials with use of cane and trekking poles for extra contact and repeated head turns with ambulation with improved performance with slower speed but able to maintain motion w/ slight imbalance provided support of AD.  Added for HEP performance walking with head turns and UE support of counter or walking stick.  Continued sessions to progress remaining goals OBJECTIVE IMPAIRMENTS: decreased balance, decreased mobility, decreased ROM, dizziness, postural dysfunction, pain, and headaches .   ACTIVITY LIMITATIONS: standing, transfers, locomotion level, and caring for others  PARTICIPATION LIMITATIONS: meal prep, cleaning, laundry, driving, shopping, and community activity  PERSONAL FACTORS: 3+ comorbidities: see above  are also affecting patient's functional outcome.   REHAB POTENTIAL: Good  CLINICAL DECISION MAKING: Stable/uncomplicated  EVALUATION COMPLEXITY: Low   PLAN:  PT FREQUENCY: 2x/week  PT DURATION: 6 weeks including eval week  PLANNED INTERVENTIONS: 97110-Therapeutic exercises, 97530- Therapeutic activity, [id number]- Neuromuscular re-education, 97535- Self Care, [id number]- Manual therapy, [id number]- Gait training, [phone]- Canalith repositioning, Patient/Family education, Balance training, and Vestibular  training  PLAN FOR NEXT SESSION: continue with habituation and may try to treat positional nystagmus to see if patient responds (although this may be  migraine-related)    1:14 PM, 06/12/23 M. Shary Decamp, PT, DPT Physical Therapist- [place] Office Number: [phone]

## 2023-06-13 ENCOUNTER — Ambulatory Visit
Admission: RE | Admit: 2023-06-13 | Discharge: 2023-06-13 | Disposition: A | Discharge status: Home / Self Care | Source: Ambulatory Visit | Attending: Neurology | Admitting: Neurology

## 2023-06-13 DIAGNOSIS — G43109 Migraine with aura, not intractable, without status migrainosus: Secondary | ICD-10-CM

## 2023-06-13 DIAGNOSIS — G4752 REM sleep behavior disorder: Secondary | ICD-10-CM | POA: Diagnosis not present

## 2023-06-13 DIAGNOSIS — H814 Vertigo of central origin: Secondary | ICD-10-CM

## 2023-06-13 DIAGNOSIS — G3184 Mild cognitive impairment, so stated: Secondary | ICD-10-CM

## 2023-06-15 ENCOUNTER — Encounter: Payer: Self-pay | Admitting: Neurology

## 2023-06-15 DIAGNOSIS — H814 Vertigo of central origin: Secondary | ICD-10-CM

## 2023-06-15 DIAGNOSIS — R9389 Abnormal findings on diagnostic imaging of other specified body structures: Secondary | ICD-10-CM

## 2023-06-15 DIAGNOSIS — H8190 Unspecified disorder of vestibular function, unspecified ear: Secondary | ICD-10-CM

## 2023-06-16 ENCOUNTER — Ambulatory Visit: Admitting: Physical Therapy

## 2023-06-16 ENCOUNTER — Encounter: Payer: Self-pay | Admitting: Physical Therapy

## 2023-06-16 DIAGNOSIS — R2681 Unsteadiness on feet: Secondary | ICD-10-CM

## 2023-06-16 DIAGNOSIS — R42 Dizziness and giddiness: Secondary | ICD-10-CM

## 2023-06-16 NOTE — Therapy (Signed)
 OUTPATIENT PHYSICAL THERAPY VESTIBULAR TREATMENT     Patient Name: Kara Tapia MRN: [id number] DOB:Aug 23, 1940, 83 y.o., female Today's Date: 06/16/2023  END OF SESSION:  PT End of Session - 06/16/23 1317     Visit Number 7    Number of Visits 13    Date for PT Re-Evaluation 07/04/23    Authorization Type [employer]BS Medicare/[employer]    Progress Note Due on Visit 10    PT Start Time 1318    PT Stop Time 1358    PT Time Calculation (min) 40 min    Equipment Utilized During Treatment --    Activity Tolerance Patient tolerated treatment well    Behavior During Therapy WFL for tasks assessed/performed                 Past Medical History:  Diagnosis Date   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/2014   PVD (peripheral vascular disease) (HCC)    Reflux esophagitis    REM sleep behavior disorder 08/09/2014   Scotoma 08/09/2014   Sleep behavior disorder, REM 06/04/2017   Past Surgical History:  Procedure Laterality Date   CARPAL TUNNEL RELEASE Right    CATARACT EXTRACTION, BILATERAL Bilateral    Patient Active Problem List   Diagnosis Date Noted   Mixed hyperlipidemia 01/09/2023   Mild memory disturbance 01/09/2023   Medicare annual wellness visit, subsequent 01/09/2023   Gastroesophageal reflux disease without  esophagitis 01/09/2023   Elevated blood pressure reading 01/09/2023   Dizziness 01/09/2023   COPD mixed type (HCC) 01/09/2023   Hematuria 01/09/2023   Abnormal cervical Papanicolaou smear 01/09/2023   Senile osteoporosis 08/03/2021   Pain in joint of right shoulder 04/29/2019   Syncope 01/12/2019   Hypertension 01/12/2019   Lisping 12/02/2017   Sleep behavior disorder, REM 06/04/2017   MCI (mild cognitive impairment) 06/04/2017   Insomnia 07/06/2015   Nightmares REM-sleep type 07/06/2015   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   Acquired trigger finger 06/14/2015   Parasomnia, organic 10/04/2014   Migraine with status migrainosus 08/09/2014   Scotoma 08/09/2014   Intractable migraine with aura with status migrainosus 08/09/2014   REM sleep behavior disorder 08/09/2014    PCP: Melida Quitter, MD REFERRING PROVIDER: Dohmeier, Porfirio Mylar, MD   REFERRING DIAG:  G43.109 (ICD-10-CM) - Migraine aura occurring with and without headache  G47.52 (ICD-10-CM) - Sleep behavior disorder, REM  G31.84 (ICD-10-CM) - MCI (mild cognitive impairment)  H81.4 (ICD-10-CM) - Vertigo of central origin  H81.90 (ICD-10-CM) - Vestibulopathy, unspecified laterality    THERAPY DIAG:  Unsteadiness on feet  Dizziness and giddiness  ONSET DATE: 05/21/2023 MD referral  Rationale for Evaluation and Treatment: Rehabilitation  SUBJECTIVE:   SUBJECTIVE STATEMENT: Had some really dizzy days, especially yesterday.  Don't  really take medicines a lot, but I did take the (?Imitrex?) medication yesterday and it helped.  Feeling pretty good.  Vision sometimes is blurred/shadows  Been working outside past two days and notes sensation of shadows and light when coming back indoors with feeling lasting 25 min of this visual distortion and feeling off balanced. Notes that laying down and shutting her eyes x 10 min helps to resolve.  Reports cleaning up in yard  wasn't too bad wih bending forward for sticks.    Pt accompanied by: self  PERTINENT HISTORY: Per Dr. Vickey Huger note:  recent optic migraine spells and beginning on 22 January a migraine that lasted about 10 minutes triggered by rapid movement, she had said to repeat because she felt otherwise at risk of falling.  On 1-23 another migraine this time 25 minutes long while working in the kitchen and standing she had to sit.  On 127 50 minutes by painting.  On 129 about 2 PM while reading, duration not stated.  On February 14 she has another 1 with a rapid movement on February 15 while walking into her kitchen at 7 pM.  On February 16 at 715 movements triggered this 1 as well on February 24 while driving and she had to take a break.  On February 25 which was yesterday while watching TV at 7.  She also gave me the correlating list of blood pressures  PMH of RBD, MC, lumbar radiculopathy, areflexia at patella level, hx of vertigo and optical migraine  PAIN:  Are you having pain? No  PRECAUTIONS: Fall  RED FLAGS: None   WEIGHT BEARING RESTRICTIONS: No  FALLS: Has patient fallen in last 6 months? Yes. Number of falls 1  LIVING ENVIRONMENT: Lives with: lives alone Lives in: House/apartment Stairs: Yes: Internal: basement and upstairs; pt doesn't go up or down these often steps; outdoor steps (4) and handrail Has following equipment at home: None  PLOF: Independent and Leisure: enjoys yardwork  PATIENT GOALS:  Pt's goals for therapy are to get rid of the dizziness and help with the ability to read without dizziness  OBJECTIVE:    TODAY'S TREATMENT: 06/16/2023 Activity Comments  Corner balance ex: Feet apart head turns/nods x 30 sec-EO/EC Feet together EO head turns/nods, EC 30 sec  No increase in dizziness  No increase in dizziness  Standing on foam: EO head turns/nods x 5 EC 30 sec head steady Feet apart and feet steady  Mild sway Mild sway with light UE support  On  foam: Heel/toe raises Marching in place Forward/back stepping   UE support throughout                    PATIENT EDUCATION: Education details: HEP update with edu for safety; answered pt's questions regarding migraines, dizziness and treating balance with exercises removing vision to help improve vestibular system use for balance Person educated: Patient Education method: Explanation, Demonstration, Tactile cues, Verbal cues, and Handouts Education comprehension: verbalized understanding and returned demonstration    HOME EXERCISE PROGRAM  Access Code: [facility] URL: [email or web address] Date: 06/16/2023 Prepared by: [facility] - Outpatient  Rehab - [facility]  Exercises - 180 Degree Pivot Turn with Single Point Cane  - 1 x daily - 5 x weekly - 2 sets - 10 reps - Forward T with Counter Support  - 1 x daily - 7 x weekly - 5 reps - Walking with Head Rotation  - 1 x daily - 7 x weekly - 3 sets -  10 reps - Oncologist Feet Together: Eyes Open With Head Turns  - 1 x daily - 7 x weekly - 1-2 sets - 5 reps - Corner Balance Feet Together: Eyes Closed With Head Turns  - 1 x daily - 7 x weekly - 1-2 sets - 5 reps - Standing Balance with Eyes Closed on Foam  - 1 x daily - 7 x weekly - 1 sets - 3 reps - 30 sec hold      ------------------------------------------------  Note: Objective measures were completed at Evaluation unless otherwise noted.  DIAGNOSTIC FINDINGS: upcoming MRI  COGNITION: Overall cognitive status: History of cognitive impairments - at baseline    POSTURE:  rounded shoulders and forward head  Cervical ROM:    Active A/PROM (deg) eval  Flexion 22 with dizziness  Extension 31  Right lateral flexion   Left lateral flexion   Right rotation 33  Left rotation 35  (Blank rows = not tested) *generally swimmy headed with ROM testing*  GAIT: Gait pattern: WFL Distance walked: clinic distances Assistive device utilized:  None Level of assistance: Complete Independence   PATIENT SURVEYS:  DHI 24  VESTIBULAR ASSESSMENT:  GENERAL OBSERVATION: Pt in no acute distress.   SYMPTOM BEHAVIOR:  Subjective history: Been having "holes in vision", headaches and dizziness, that have been going on for several years; worsening in prevalence in past 1-6 months.  Had virus in the past month, but it was not Covid  Non-Vestibular symptoms: headaches, migraine symptoms, and black spots/holes in vision  Type of dizziness: Imbalance (Disequilibrium), Unsteady with head/body turns, Lightheadedness/Faint, and "drunk feeling"  Frequency: >3x/wk  Duration: minutes  Aggravating factors: Induced by motion: turning body quickly and turning head quickly and definite changes in light (going from bright outside light to indoors)  Relieving factors: lying supine, closing eyes, and rest  Progression of symptoms: worse  OCULOMOTOR EXAM:  Wears progressive lenses; blinks frequently and reports this is due to dry eyes  Ocular Alignment: normal  Ocular ROM: No Limitations  Spontaneous Nystagmus: absent  Gaze-Induced Nystagmus: absent  Smooth Pursuits: intact  Saccades: intact  Convergence/Divergence: 2 cm   VESTIBULAR - OCULAR REFLEX:   Slow VOR: Comment: horizontal 3/10 swimmy headed; no symptoms vertical  VOR Cancellation: Corrective Saccades with horizontal 6/10  Head-Impulse Test: HIT Right: positive HIT Left: positive Increased symptoms bilaterally  Dynamic Visual Acuity:  NT   POSITIONAL TESTING: Other: NT due to time constraints and pt does not report any spinning sensations  MOTION SENSITIVITY:NT  ORTHOSTATICS: Seated 125/86, HR 70; standing 90/71, HR 96 bpm    M-CTSIB  Condition 1: Firm Surface, EO 30 Sec, Mild Sway  Condition 2: Firm Surface, EC 30 Sec, Moderate Sway  Condition 3: Foam Surface, EO 30 Sec, Mild Sway  Condition 4: Foam Surface, EC 23Sec, Severe Sway  TREATMENT DATE: 05/26/2023    PATIENT EDUCATION: Education details: Eval results, POC, rationale for vestibular rehab; recommendations for general activity level and hydration; educated on BP measures and orthostatic BP  Person educated: Patient Education method: Explanation Education comprehension: verbalized understanding  HOME EXERCISE PROGRAM:  GOALS: Goals reviewed with patient? Yes  SHORT TERM GOALS: Target date: 06/06/2023  Pt will be independent with HEP for improved balance, decreased dizziness/headache symptoms. Baseline: Goal status: MET   LONG TERM GOALS: Target date: 07/04/2023  Pt will be independent with progression of HEP for improved balance, decreased dizziness and headache symptoms. Baseline:  Goal status: IN PROGRESS  2.  Pt will improve MCTSIB Condition 4 to 30 seconds with mod sway or less for improved balance and functional mobility. Baseline: mild-moderate Goal status: MET  3.  FGA to be assessed with pt to score >/=22/30 for decreased fall risk Baseline: 22/30 Goal status: MET  4.  DHI score to improve to </= 20 to demo decreased dizziness. Baseline:  Goal status: IN PROGRESS  ASSESSMENT:  CLINICAL IMPRESSION: Pt presents today with reports of some dizzy days recently, but overall pt feels like therapy is helping.  Skilled PT session focused on progression of balance exercises, with vision removed, and with foam cushion.  Updated balance ex to reflect narrowed BOS with head motions and then standing on compliant surface with wide BOS and EC. Pt needs light UE support on compliant surface.  Please note pt has some additional notes from Dr. Vickey Huger from MRI results, but no new orders on chart at this time.. Pt will continue to benefit from skilled PT towards goals for improved functional mobility and decreased fall risk.   OBJECTIVE IMPAIRMENTS: decreased balance, decreased  mobility, decreased ROM, dizziness, postural dysfunction, pain, and headaches .   ACTIVITY LIMITATIONS: standing, transfers, locomotion level, and caring for others  PARTICIPATION LIMITATIONS: meal prep, cleaning, laundry, driving, shopping, and community activity  PERSONAL FACTORS: 3+ comorbidities: see above  are also affecting patient's functional outcome.   REHAB POTENTIAL: Good  CLINICAL DECISION MAKING: Stable/uncomplicated  EVALUATION COMPLEXITY: Low   PLAN:  PT FREQUENCY: 2x/week  PT DURATION: 6 weeks including eval week  PLANNED INTERVENTIONS: 97110-Therapeutic exercises, 97530- Therapeutic activity, 97112- Neuromuscular re-education, 97535- Self Care, 78469- Manual therapy, [phone]- Gait training, [phone]- Canalith repositioning, Patient/Family education, Balance training, and Vestibular training  PLAN FOR NEXT SESSION: Balance exercises on compliant surfaces, vision removed; review updates to HEP and progress as able   Lonia Blood, PT 06/16/23 2:00 PM Phone: [phone] Fax: [phone]  [facility] at [facility] [address] [place], [place] [address] Phone # [phone] Fax # [phone]

## 2023-06-17 NOTE — Addendum Note (Signed)
 Addended by: Mariea Stable on: 06/17/2023 11:11 AM   Modules accepted: Orders

## 2023-06-18 ENCOUNTER — Ambulatory Visit: Admitting: Physical Therapy

## 2023-06-18 DIAGNOSIS — R42 Dizziness and giddiness: Secondary | ICD-10-CM | POA: Diagnosis not present

## 2023-06-18 DIAGNOSIS — R2681 Unsteadiness on feet: Secondary | ICD-10-CM

## 2023-06-18 NOTE — Therapy (Signed)
 OUTPATIENT PHYSICAL THERAPY VESTIBULAR TREATMENT   Patient Name: ARCENIA SCARBRO MRN: [id number] DOB:19-Apr-1940, 83 y.o., female Today's Date: 06/18/2023  END OF SESSION:  PT End of Session - 06/18/23 1227     Visit Number 8    Number of Visits 13    Date for PT Re-Evaluation 07/04/23    Authorization Type BCBS Medicare/[employer]    Progress Note Due on Visit 10    PT Start Time 1230    PT Stop Time 1310    PT Time Calculation (min) 40 min    Activity Tolerance Patient tolerated treatment well    Behavior During Therapy WFL for tasks assessed/performed                  Past Medical History:  Diagnosis Date   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/2014   PVD (peripheral vascular disease) (HCC)    Reflux esophagitis    REM sleep behavior disorder 08/09/2014   Scotoma 08/09/2014   Sleep behavior disorder, REM 06/04/2017   Past Surgical History:  Procedure Laterality Date   CARPAL TUNNEL RELEASE Right    CATARACT EXTRACTION, BILATERAL Bilateral    Patient Active Problem List   Diagnosis Date Noted   Mixed hyperlipidemia 01/09/2023   Mild memory disturbance 01/09/2023   Medicare annual wellness visit, subsequent 01/09/2023   Gastroesophageal reflux disease without esophagitis 01/09/2023   Elevated blood  pressure reading 01/09/2023   Dizziness 01/09/2023   COPD mixed type (HCC) 01/09/2023   Hematuria 01/09/2023   Abnormal cervical Papanicolaou smear 01/09/2023   Senile osteoporosis 08/03/2021   Pain in joint of right shoulder 04/29/2019   Syncope 01/12/2019   Hypertension 01/12/2019   Lisping 12/02/2017   Sleep behavior disorder, REM 06/04/2017   MCI (mild cognitive impairment) 06/04/2017   Insomnia 07/06/2015   Nightmares REM-sleep type 07/06/2015   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   Acquired trigger finger 06/14/2015   Parasomnia, organic 10/04/2014   Migraine with status migrainosus 08/09/2014   Scotoma 08/09/2014   Intractable migraine with aura with status migrainosus 08/09/2014   REM sleep behavior disorder 08/09/2014    PCP: Melida Quitter, MD REFERRING PROVIDER: Dohmeier, Porfirio Mylar, MD   REFERRING DIAG:  G43.109 (ICD-10-CM) - Migraine aura occurring with and without headache  G47.52 (ICD-10-CM) - Sleep behavior disorder, REM  G31.84 (ICD-10-CM) - MCI (mild cognitive impairment)  H81.4 (ICD-10-CM) - Vertigo of central origin  H81.90 (ICD-10-CM) - Vestibulopathy, unspecified laterality   From new orders 06/17/23: H81.90 (ICD-10-CM) - Vestibulopathy, unspecified laterality  H81.4 (ICD-10-CM) - Vertigo of central origin  R93.89 (ICD-10-CM) - Abnormal MRI, neck    THERAPY DIAG:  Unsteadiness on feet  Dizziness and giddiness  ONSET DATE: 05/21/2023 MD referral  Rationale for Evaluation  and Treatment: Rehabilitation  SUBJECTIVE:   SUBJECTIVE STATEMENT: Pt states "it's amazing what the therapy has done.Marland KitchenMarland KitchenThere are days that I'm just really light headed and dizzy but that is becoming less frequent." Pt denies any falls or new medication. A little dizzy today -- 3/10. "For the first time I was able to work outside and didn't feel like I was going to fall on the floor"   Pt accompanied by:  self  PERTINENT HISTORY: Per Dr. Vickey Huger note:  recent optic migraine spells and beginning on 22 January a migraine that lasted about 10 minutes triggered by rapid movement, she had said to repeat because she felt otherwise at risk of falling.  On 1-23 another migraine this time 25 minutes long while working in the kitchen and standing she had to sit.  On 127 50 minutes by painting.  On 129 about 2 PM while reading, duration not stated.  On February 14 she has another 1 with a rapid movement on February 15 while walking into her kitchen at 7 pM.  On February 16 at 715 movements triggered this 1 as well on February 24 while driving and she had to take a break.  On February 25 which was yesterday while watching TV at 7.  She also gave me the correlating list of blood pressures  PMH of RBD, MC, lumbar radiculopathy, areflexia at patella level, hx of vertigo and optical migraine  PAIN:  Are you having pain? No  PRECAUTIONS: Fall  RED FLAGS: None   WEIGHT BEARING RESTRICTIONS: No  FALLS: Has patient fallen in last 6 months? Yes. Number of falls 1  LIVING ENVIRONMENT: Lives with: lives alone Lives in: House/apartment Stairs: Yes: Internal: basement and upstairs; pt doesn't go up or down these often steps; outdoor steps (4) and handrail Has following equipment at home: None  PLOF: Independent and Leisure: enjoys yardwork  PATIENT GOALS:  Pt's goals for therapy are to get rid of the dizziness and help with the ability to read without dizziness  OBJECTIVE:    TODAY'S TREATMENT: 06/18/23 Corner balance feet together EO, head turns x30 sec; no increased dizziness EO, head nods x30 sec; no increased dizziness EC, head turns x30 sec; no increased dizziness EC, head nods x30 sec; no increased dizziness Saccades horizontal and vertical x30 sec each Corrective saccades horizontal and vertical x30 sec ("a little increase in dizziness") VOR x1 horizontal and vertical for 2x30 sec "A little dizzy"  to metronome 110 BPM - 120 BPM  Corner balance feet apart on foam EC static balance 2x30 sec (mild sway; intermittent UE support x1) Vestibular ball x30 sec CW & CCW Bow forward and then look up following target 2x30 sec Heel/toe raise x10 with intermittent UE support Mini squat 2x10 no UE support Marching 2x10 no UE support    PATIENT EDUCATION: Education details: HEP Person educated: Patient Education method: Programmer, multimedia, Demonstration, Actor cues, Verbal cues, and Handouts Education comprehension: verbalized understanding and returned demonstration    HOME EXERCISE PROGRAM  Access Code: [facility] URL: [email or web address] Date: 06/16/2023 Prepared by: [facility] - Outpatient  Rehab - [facility]  Exercises - 180 Degree Pivot Turn with Single Point Cane  - 1 x daily - 5 x weekly - 2 sets - 10 reps - Forward T with Counter Support  - 1 x daily - 7 x weekly - 5 reps - Walking with Head Rotation  - 1 x daily - 7 x weekly - 3 sets - 10 reps - Oncologist  Feet Together: Eyes Open With Head Turns  - 1 x daily - 7 x weekly - 1-2 sets - 5 reps - Corner Balance Feet Together: Eyes Closed With Head Turns  - 1 x daily - 7 x weekly - 1-2 sets - 5 reps - Standing Balance with Eyes Closed on Foam  - 1 x daily - 7 x weekly - 1 sets - 3 reps - 30 sec hold      ------------------------------------------------  Note: Objective measures were completed at Evaluation unless otherwise noted.  DIAGNOSTIC FINDINGS: MRI 06/13/23 IMPRESSION: MRI scan of the brain without contrast showing moderate changes of age-related chronic small vessel disease and high degree of generalized cerebral atrophy.  These changes appear slightly progressed compared with previous MRI from 2019.   IMPRESSION: MRI cervical spine without contrast showing prominent spondylitic changes from C3-C6 7 most prominent at C5-6 where there is multi canal narrowing with mild cord compression and bilateral  foraminal narrowing.  C4-5 also shows mild canal narrowing with severe bilateral foraminal narrowing.   COGNITION: Overall cognitive status: History of cognitive impairments - at baseline    POSTURE:  rounded shoulders and forward head  Cervical ROM:    Active A/PROM (deg) eval  Flexion 22 with dizziness  Extension 31  Right lateral flexion   Left lateral flexion   Right rotation 33  Left rotation 35  (Blank rows = not tested) *generally swimmy headed with ROM testing*  GAIT: Gait pattern: WFL Distance walked: clinic distances Assistive device utilized: None Level of assistance: Complete Independence   PATIENT SURVEYS:  DHI 24  VESTIBULAR ASSESSMENT:  GENERAL OBSERVATION: Pt in no acute distress.   SYMPTOM BEHAVIOR:  Subjective history: Been having "holes in vision", headaches and dizziness, that have been going on for several years; worsening in prevalence in past 1-6 months.  Had virus in the past month, but it was not Covid  Non-Vestibular symptoms: headaches, migraine symptoms, and black spots/holes in vision  Type of dizziness: Imbalance (Disequilibrium), Unsteady with head/body turns, Lightheadedness/Faint, and "drunk feeling"  Frequency: >3x/wk  Duration: minutes  Aggravating factors: Induced by motion: turning body quickly and turning head quickly and definite changes in light (going from bright outside light to indoors)  Relieving factors: lying supine, closing eyes, and rest  Progression of symptoms: worse  OCULOMOTOR EXAM:  Wears progressive lenses; blinks frequently and reports this is due to dry eyes  Ocular Alignment: normal  Ocular ROM: No Limitations  Spontaneous Nystagmus: absent  Gaze-Induced Nystagmus: absent  Smooth Pursuits: intact  Saccades: intact  Convergence/Divergence: 2 cm   VESTIBULAR - OCULAR REFLEX:   Slow VOR: Comment: horizontal 3/10 swimmy headed; no symptoms vertical  VOR Cancellation: Corrective Saccades with horizontal  6/10  Head-Impulse Test: HIT Right: positive HIT Left: positive Increased symptoms bilaterally  Dynamic Visual Acuity:  NT   POSITIONAL TESTING: Other: NT due to time constraints and pt does not report any spinning sensations  MOTION SENSITIVITY:NT  ORTHOSTATICS: Seated 125/86, HR 70; standing 90/71, HR 96 bpm   M-CTSIB  Condition 1: Firm Surface, EO 30 Sec, Mild Sway  Condition 2: Firm Surface, EC 30 Sec, Moderate Sway  Condition 3: Foam Surface, EO 30 Sec, Mild Sway  Condition 4: Foam Surface, EC 23Sec, Severe Sway     PATIENT EDUCATION: Education details: Eval results, POC, rationale for vestibular rehab; recommendations for general activity level and hydration; educated on BP measures and orthostatic BP  Person educated: Patient Education method: Explanation Education comprehension: verbalized  understanding  GOALS: Goals reviewed with patient? Yes  SHORT TERM GOALS: Target date: 06/06/2023  Pt will be independent with HEP for improved balance, decreased dizziness/headache symptoms. Baseline: Goal status: MET   LONG TERM GOALS: Target date: 07/04/2023  Pt will be independent with progression of HEP for improved balance, decreased dizziness and headache symptoms. Baseline:  Goal status: IN PROGRESS  2.  Pt will improve MCTSIB Condition 4 to 30 seconds with mod sway or less for improved balance and functional mobility. Baseline: mild-moderate Goal status: MET  3.  FGA to be assessed with pt to score >/=22/30 for decreased fall risk Baseline: 22/30 Goal status: MET  4.  DHI score to improve to </= 20 to demo decreased dizziness. Baseline:  Goal status: IN PROGRESS  ASSESSMENT:  CLINICAL IMPRESSION: Pt is a transfer from [facility] for easier commute. Continued to work on balance and vestibular exercises. Worked on gaze stabilization with metronome. Remains challenged on compliant surface especially with vision removed. Overall, symptoms have been  improving. Did not yet change her HEP as it was just updated earlier this week.   OBJECTIVE IMPAIRMENTS: decreased balance, decreased mobility, decreased ROM, dizziness, postural dysfunction, pain, and headaches .   ACTIVITY LIMITATIONS: standing, transfers, locomotion level, and caring for others  PARTICIPATION LIMITATIONS: meal prep, cleaning, laundry, driving, shopping, and community activity  PERSONAL FACTORS: 3+ comorbidities: see above  are also affecting patient's functional outcome.   REHAB POTENTIAL: Good  CLINICAL DECISION MAKING: Stable/uncomplicated  EVALUATION COMPLEXITY: Low   PLAN:  PT FREQUENCY: 2x/week  PT DURATION: 6 weeks including eval week  PLANNED INTERVENTIONS: 97110-Therapeutic exercises, 97530- Therapeutic activity, 97112- Neuromuscular re-education, 97535- Self Care, 29562- Manual therapy, [phone]- Gait training, [phone]- Canalith repositioning, Patient/Family education, Balance training, and Vestibular training  PLAN FOR NEXT SESSION: Balance exercises on compliant surfaces, vision removed; review updates to HEP and progress as able   [facility] April Ma L Kaleel Schmieder, PT 06/18/23 12:27 PM

## 2023-06-19 ENCOUNTER — Ambulatory Visit: Admitting: Physical Therapy

## 2023-06-19 ENCOUNTER — Telehealth: Payer: Self-pay | Admitting: Neurology

## 2023-06-19 NOTE — Telephone Encounter (Signed)
 See addendum by Dr Pearlean Brownie for  his interpretation of MRI brain.   Melvyn Novas, MD

## 2023-06-21 ENCOUNTER — Encounter: Payer: Self-pay | Admitting: Neurology

## 2023-06-23 ENCOUNTER — Ambulatory Visit: Admitting: Physical Therapy

## 2023-06-23 ENCOUNTER — Encounter: Payer: Self-pay | Admitting: Physical Therapy

## 2023-06-23 VITALS — BP 139/88 | HR 82

## 2023-06-23 DIAGNOSIS — R42 Dizziness and giddiness: Secondary | ICD-10-CM

## 2023-06-23 DIAGNOSIS — R2681 Unsteadiness on feet: Secondary | ICD-10-CM | POA: Diagnosis not present

## 2023-06-23 NOTE — Therapy (Signed)
 OUTPATIENT PHYSICAL THERAPY VESTIBULAR TREATMENT   Patient Name: Kara Tapia MRN: [id number] DOB:06-08-1940, 83 y.o., female Today's Date: 06/23/2023  END OF SESSION:  PT End of Session - 06/23/23 1527     Visit Number 9    Number of Visits 13    Date for PT Re-Evaluation 07/04/23    Authorization Type [employer] Medicare/[employer]    Progress Note Due on Visit 10    PT Start Time 1526    PT Stop Time 1608    PT Time Calculation (min) 42 min    Equipment Utilized During Treatment Gait belt    Activity Tolerance Patient tolerated treatment well    Behavior During Therapy WFL for tasks assessed/performed                  Past Medical History:  Diagnosis Date   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/2014   PVD (peripheral vascular disease) (HCC)    Reflux esophagitis    REM sleep behavior disorder 08/09/2014   Scotoma 08/09/2014   Sleep behavior disorder, REM 06/04/2017   Past Surgical History:  Procedure Laterality Date   CARPAL TUNNEL RELEASE Right    CATARACT EXTRACTION, BILATERAL Bilateral    Patient Active Problem List   Diagnosis Date Noted   Mixed hyperlipidemia 01/09/2023   Mild memory disturbance 01/09/2023   Medicare annual wellness visit, subsequent 01/09/2023   Gastroesophageal reflux disease  without esophagitis 01/09/2023   Elevated blood pressure reading 01/09/2023   Dizziness 01/09/2023   COPD mixed type (HCC) 01/09/2023   Hematuria 01/09/2023   Abnormal cervical Papanicolaou smear 01/09/2023   Senile osteoporosis 08/03/2021   Pain in joint of right shoulder 04/29/2019   Syncope 01/12/2019   Hypertension 01/12/2019   Lisping 12/02/2017   Sleep behavior disorder, REM 06/04/2017   MCI (mild cognitive impairment) 06/04/2017   Insomnia 07/06/2015   Nightmares REM-sleep type 07/06/2015   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   Acquired trigger finger 06/14/2015   Parasomnia, organic 10/04/2014   Migraine with status migrainosus 08/09/2014   Scotoma 08/09/2014   Intractable migraine with aura with status migrainosus 08/09/2014   REM sleep behavior disorder 08/09/2014    PCP: Melida Quitter, MD REFERRING PROVIDER: Dohmeier, Porfirio Mylar, MD   REFERRING DIAG:  G43.109 (ICD-10-CM) - Migraine aura occurring with and without headache  G47.52 (ICD-10-CM) - Sleep behavior disorder, REM  G31.84 (ICD-10-CM) - MCI (mild cognitive impairment)  H81.4 (ICD-10-CM) - Vertigo of central origin  H81.90 (ICD-10-CM) - Vestibulopathy, unspecified laterality   From new orders 06/17/23: H81.90 (ICD-10-CM) - Vestibulopathy, unspecified laterality  H81.4 (ICD-10-CM) - Vertigo of central origin  R93.89 (ICD-10-CM) - Abnormal MRI, neck    THERAPY DIAG:  Dizziness and giddiness  Unsteadiness on feet  ONSET DATE: 05/21/2023 MD referral  Rationale for Evaluation and Treatment: Rehabilitation  SUBJECTIVE:   SUBJECTIVE STATEMENT: Reports today she is feeling really dizzy. Has sporadic days of dizziness. Reports just looking at the therapist feels like her eyes are moving. Feels like space is kinda unsteady. Reports this is normally how her dizziness feels. Not having a migraine now, feels like she just learned to live with it. Notes  switching from bright to dimmer light will make dizziness worse, but now it is not as relevant. Standing on the air ex is challenging for her balance. Did it yesterday with pretty good success. Since starting therapy, has been having less episodes of dizziness. Notes dizziness has been going on for a while. Dizziness got worse about a month ago, happened after she was sick.  Pt accompanied by: self  PERTINENT HISTORY: Per Dr. Vickey Huger note:  recent optic migraine spells and beginning on 22 January a migraine that lasted about 10 minutes triggered by rapid movement, she had said to repeat because she felt otherwise at risk of falling.  On 1-23 another migraine this time 25 minutes long while working in the kitchen and standing she had to sit.  On 127 50 minutes by painting.  On 129 about 2 PM while reading, duration not stated.  On February 14 she has another 1 with a rapid movement on February 15 while walking into her kitchen at 7 pM.  On February 16 at 715 movements triggered this 1 as well on February 24 while driving and she had to take a break.  On February 25 which was yesterday while watching TV at 7.  She also gave me the correlating list of blood pressures  PMH of RBD, MC, lumbar radiculopathy, areflexia at patella level, hx of vertigo and optical migraine  PAIN:  Are you having pain? No  PRECAUTIONS: Fall  RED FLAGS: None   WEIGHT BEARING RESTRICTIONS: No  FALLS: Has patient fallen in last 6 months? Yes. Number of falls 1  LIVING ENVIRONMENT: Lives with: lives alone Lives in: House/apartment Stairs: Yes: Internal: basement and upstairs; pt doesn't go up or down these often steps; outdoor steps (4) and handrail Has following equipment at home: None  PLOF: Independent and Leisure: enjoys yardwork  PATIENT GOALS:  Pt's goals for therapy are to get rid of the dizziness and help with the ability to read without dizziness    ------------------------------------------------  Note:  Objective measures were completed at Evaluation unless otherwise noted.  DIAGNOSTIC FINDINGS: MRI 06/13/23 IMPRESSION: MRI scan of the brain without contrast showing moderate changes of age-related chronic small vessel disease and high degree of generalized cerebral atrophy.  These changes appear slightly progressed compared with previous MRI from 2019.   IMPRESSION: MRI cervical spine without contrast showing prominent spondylitic changes from C3-C6 7 most prominent at C5-6 where there is multi canal narrowing with mild cord compression and bilateral foraminal narrowing.  C4-5 also shows mild canal narrowing with severe bilateral foraminal narrowing.   COGNITION: Overall cognitive status: History of cognitive impairments - at baseline    POSTURE:  rounded shoulders and forward head  Cervical ROM:    Active A/PROM (deg) eval  Flexion 22 with dizziness  Extension 31  Right lateral flexion   Left lateral flexion   Right rotation 33  Left rotation 35  (Blank rows = not tested) *generally swimmy headed with ROM testing*  GAIT: Gait pattern: WFL Distance walked: clinic distances Assistive device utilized: None Level of assistance:  Complete Independence   PATIENT SURVEYS:  DHI 24  VESTIBULAR ASSESSMENT:  GENERAL OBSERVATION: Pt in no acute distress.   SYMPTOM BEHAVIOR:  Subjective history: Been having "holes in vision", headaches and dizziness, that have been going on for several years; worsening in prevalence in past 1-6 months.  Had virus in the past month, but it was not Covid  Non-Vestibular symptoms: headaches, migraine symptoms, and black spots/holes in vision  Type of dizziness: Imbalance (Disequilibrium), Unsteady with head/body turns, Lightheadedness/Faint, and "drunk feeling"  Frequency: >3x/wk  Duration: minutes  Aggravating factors: Induced by motion: turning body quickly and turning head quickly and definite changes in light (going from bright outside light to  indoors)  Relieving factors: lying supine, closing eyes, and rest  Progression of symptoms: worse  OCULOMOTOR EXAM:  Wears progressive lenses; blinks frequently and reports this is due to dry eyes  Ocular Alignment: normal  Ocular ROM: No Limitations  Spontaneous Nystagmus: absent  Gaze-Induced Nystagmus: absent  Smooth Pursuits: intact  Saccades: intact  Convergence/Divergence: 2 cm   VESTIBULAR - OCULAR REFLEX:   Slow VOR: Comment: horizontal 3/10 swimmy headed; no symptoms vertical  VOR Cancellation: Corrective Saccades with horizontal 6/10  Head-Impulse Test: HIT Right: positive HIT Left: positive Increased symptoms bilaterally  Dynamic Visual Acuity: NT   POSITIONAL TESTING: Other: NT due to time constraints and pt does not report any spinning sensations  MOTION SENSITIVITY:NT  ORTHOSTATICS: Seated 125/86, HR 70; standing 90/71, HR 96 bpm   M-CTSIB  Condition 1: Firm Surface, EO 30 Sec, Mild Sway  Condition 2: Firm Surface, EC 30 Sec, Moderate Sway  Condition 3: Foam Surface, EO 30 Sec, Mild Sway  Condition 4: Foam Surface, EC 23 Sec, Severe Sway    VESTIBULAR TREATMENT: 06/23/23   Therapeutic Activity:   Vitals:   06/23/23 1535  BP: 139/88  Pulse: 82   DVA: Static: Line 9 Dynamic: Line 6 Pt having dizziness during  NMR:   Gaze Adaptation: x1 Viewing Horizontal: Position: Standing, Time: 30 seconds, Reps: 2, and Comment: pt reporting mild dizziness, cues to keep shoulders still and just to move head  x1 Viewing Vertical:  Position: Standing, Time: 30 seconds, Reps: 2, and Comment: pt reporting no incr in dizziness, pt already having a dizzy day    On air ex: With feet slightly apart: EC 3 x 30 seconds, pt needing intermittent fingertip support  Wide wide BOS EC 2 x 10 reps head turns, 2 x 10 reps, pt needing intermittent fingertip support  Sit to stands with EO: 5 reps, with EC: 5 reps with BUE support, 2 x 5 reps with no UE support and initially  CGA With feet hip width > together Edwyna Shell Chart 2 sets of 5 lines, with pt with good speed and accuracy of saccades, just felt more unsteady when bringing feet together    PATIENT EDUCATION: Education details: Purpose of VOR and results of DVA testing, standing VOR x1 addition to HEP  Person educated: Patient Education method: Explanation, Demonstration, Tactile cues, Verbal cues, and Handouts Education comprehension: verbalized understanding and returned demonstration   HOME EXERCISE PROGRAM  Access Code: [facility] URL: [email or web address] Date: 06/16/2023 Prepared by: [facility] - Outpatient  Rehab - [facility]  Exercises - 180 Degree Pivot Turn with Single Point Cane  - 1 x daily - 5 x weekly - 2 sets - 10 reps - Forward T with Counter Support  - 1 x daily - 7 x weekly - 5 reps - Walking with  Head Rotation  - 1 x daily - 7 x weekly - 3 sets - 10 reps - Corner Balance Feet Together: Eyes Open With Head Turns  - 1 x daily - 7 x weekly - 1-2 sets - 5 reps - Corner Balance Feet Together: Eyes Closed With Head Turns  - 1 x daily - 7 x weekly - 1-2 sets - 5 reps - Standing Balance with Eyes Closed on Foam  - 1 x daily - 7 x weekly - 1 sets - 3 reps - 30 sec hold  Standing VOR x1 in horizontal and vertical direction for 30 seconds    GOALS: Goals reviewed with patient? Yes  SHORT TERM GOALS: Target date: 06/06/2023  Pt will be independent with HEP for improved balance, decreased dizziness/headache symptoms. Baseline: Goal status: MET   LONG TERM GOALS: Target date: 07/04/2023  Pt will be independent with progression of HEP for improved balance, decreased dizziness and headache symptoms. Baseline:  Goal status: IN PROGRESS  2.  Pt will improve MCTSIB Condition 4 to 30 seconds with mod sway or less for improved balance and functional mobility. Baseline: mild-moderate Goal status: MET  3.  FGA to be assessed with pt to score >/=22/30 for decreased fall  risk Baseline: 22/30 Goal status: MET  4.  DHI score to improve to </= 20 to demo decreased dizziness. Baseline:  Goal status: IN PROGRESS  ASSESSMENT:  CLINICAL IMPRESSION: Pt reporting feeling a little more dizzy today. Assessed BP and WNL for therapy. Assessed DVA with pt having a 3 line difference and incr dizziness, indicating impaired VOR. Added standing VOR x1 to HEP with pt more challenged in the horizontal direction. Remainder of session focused on balance with incr vestibular input on compliant surfaces. Pt with incr postural sway with balance with EC. Will continue per POC.    OBJECTIVE IMPAIRMENTS: decreased balance, decreased mobility, decreased ROM, dizziness, postural dysfunction, pain, and headaches .   ACTIVITY LIMITATIONS: standing, transfers, locomotion level, and caring for others  PARTICIPATION LIMITATIONS: meal prep, cleaning, laundry, driving, shopping, and community activity  PERSONAL FACTORS: 3+ comorbidities: see above  are also affecting patient's functional outcome.   REHAB POTENTIAL: Good  CLINICAL DECISION MAKING: Stable/uncomplicated  EVALUATION COMPLEXITY: Low   PLAN:  PT FREQUENCY: 2x/week  PT DURATION: 6 weeks including eval week  PLANNED INTERVENTIONS: 97110-Therapeutic exercises, 97530- Therapeutic activity, [id number]- Neuromuscular re-education, 97535- Self Care, [id number]- Manual therapy, [phone]- Gait training, [phone]- Canalith repositioning, Patient/Family education, Balance training, and Vestibular training  PLAN FOR NEXT SESSION: Will need 10th visit PN   Balance exercises on compliant surfaces, vision removed, progress VOR exercises, vestibular input for balance    Drake Leach, PT, DPT 06/23/23 4:18 PM

## 2023-06-23 NOTE — Patient Instructions (Signed)
 Gaze Stabilization: Standing Feet Apart    Feet shoulder width apart, keeping eyes on target on wall a few feet away, tilt head down 15-30 and move head side to side for __30__ seconds. Repeat while moving head up and down for ___30_ seconds.  Perform 2-3 sets of each.  Do __2__ sessions per day. Use a plain background   Gaze Stabilization: Tip Card  1.Target must remain in focus, not blurry, and appear stationary while head is in motion. 2.Perform exercises with small head movements (45 to either side of midline). 3.Increase speed of head motion so long as target is in focus. 4.If you wear eyeglasses, be sure you can see target through lens (therapist will give specific instructions for bifocal / progressive lenses). 5.These exercises may provoke dizziness or nausea. Work through these symptoms. If too dizzy, slow head movement slightly. Rest between each exercise. 6.Exercises demand concentration; avoid distractions.

## 2023-06-26 ENCOUNTER — Ambulatory Visit

## 2023-06-26 ENCOUNTER — Ambulatory Visit: Attending: Neurology | Admitting: Physical Therapy

## 2023-06-26 DIAGNOSIS — R42 Dizziness and giddiness: Secondary | ICD-10-CM | POA: Diagnosis not present

## 2023-06-26 DIAGNOSIS — R2681 Unsteadiness on feet: Secondary | ICD-10-CM | POA: Insufficient documentation

## 2023-06-26 NOTE — Therapy (Unsigned)
 OUTPATIENT PHYSICAL THERAPY VESTIBULAR TREATMENT/10th VISIT PROGRESS NOTE    Progress Note Reporting Period 05-26-23 to 06-26-23  See note below for Objective Data and Assessment of Progress/Goals.     Patient Name: Kara Tapia MRN: [id number] DOB:1940-10-17, 83 y.o., female Today's Date: 06/27/2023  END OF SESSION:  PT End of Session - 06/27/23 1009     Visit Number 10    Number of Visits 13    Date for PT Re-Evaluation 07/04/23    Authorization Type BCBS Medicare/[employer]    Progress Note Due on Visit 10    PT Start Time 1450    PT Stop Time 1545    PT Time Calculation (min) 55 min    Equipment Utilized During Treatment Gait belt    Activity Tolerance Patient tolerated treatment well    Behavior During Therapy WFL for tasks assessed/performed                   Past Medical History:  Diagnosis Date   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/2014   PVD (peripheral vascular disease) (HCC)    Reflux esophagitis    REM sleep behavior disorder 08/09/2014   Scotoma 08/09/2014   Sleep behavior disorder, REM 06/04/2017   Past Surgical History:  Procedure Laterality Date   CARPAL TUNNEL RELEASE Right    CATARACT EXTRACTION, BILATERAL Bilateral    Patient Active Problem List   Diagnosis Date Noted   Mixed  hyperlipidemia 01/09/2023   Mild memory disturbance 01/09/2023   Medicare annual wellness visit, subsequent 01/09/2023   Gastroesophageal reflux disease without esophagitis 01/09/2023   Elevated blood pressure reading 01/09/2023   Dizziness 01/09/2023   COPD mixed type (HCC) 01/09/2023   Hematuria 01/09/2023   Abnormal cervical Papanicolaou smear 01/09/2023   Senile osteoporosis 08/03/2021   Pain in joint of right shoulder 04/29/2019   Syncope 01/12/2019   Hypertension 01/12/2019   Lisping 12/02/2017   Sleep behavior disorder, REM 06/04/2017   MCI (mild cognitive impairment) 06/04/2017   Insomnia 07/06/2015   Nightmares REM-sleep type 07/06/2015   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   Acquired trigger finger 06/14/2015   Parasomnia, organic 10/04/2014   Migraine with status migrainosus 08/09/2014   Scotoma 08/09/2014   Intractable migraine with aura with status migrainosus 08/09/2014   REM sleep behavior disorder 08/09/2014    PCP: Melida Quitter, MD REFERRING PROVIDER: Dohmeier, Porfirio Mylar, MD   REFERRING DIAG:  G43.109 (ICD-10-CM) - Migraine aura occurring with and without headache  G47.52 (ICD-10-CM) - Sleep behavior disorder, REM  G31.84 (ICD-10-CM) - MCI (mild cognitive impairment)  H81.4 (ICD-10-CM) - Vertigo of central origin  H81.90 (ICD-10-CM) - Vestibulopathy, unspecified laterality   From new orders 06/17/23: H81.90 (ICD-10-CM) - Vestibulopathy, unspecified laterality  H81.4 (  ICD-10-CM) - Vertigo of central origin  R93.89 (ICD-10-CM) - Abnormal MRI, neck    THERAPY DIAG:  Dizziness and giddiness  Unsteadiness on feet  ONSET DATE: 05/21/2023 MD referral  Rationale for Evaluation and Treatment: Rehabilitation  SUBJECTIVE:   SUBJECTIVE STATEMENT: Pt reports she has log of her BP readings - 108/79 recorded earlier this morning - says her BP has been good recently.  Has been doing the letter  exercise as instructed in previous session but states the exercise doesn't really provoke any symptoms.  Pt reports she is going to [place] at end of April for her granddaughter's wedding and will be gone for couple of weeks; would like to schedule an appt after she returns to see if how she is doing after this trip.  Pt accompanied by: self  PERTINENT HISTORY: Per Dr. Vickey Huger note:  recent optic migraine spells and beginning on 22 January a migraine that lasted about 10 minutes triggered by rapid movement, she had said to repeat because she felt otherwise at risk of falling.  On 1-23 another migraine this time 25 minutes long while working in the kitchen and standing she had to sit.  On 127 50 minutes by painting.  On 129 about 2 PM while reading, duration not stated.  On February 14 she has another 1 with a rapid movement on February 15 while walking into her kitchen at 7 pM.  On February 16 at 715 movements triggered this 1 as well on February 24 while driving and she had to take a break.  On February 25 which was yesterday while watching TV at 7.  She also gave me the correlating list of blood pressures  PMH of RBD, MC, lumbar radiculopathy, areflexia at patella level, hx of vertigo and optical migraine  PAIN:  Are you having pain? No  PRECAUTIONS: Fall  RED FLAGS: None   WEIGHT BEARING RESTRICTIONS: No  FALLS: Has patient fallen in last 6 months? Yes. Number of falls 1  LIVING ENVIRONMENT: Lives with: lives alone Lives in: House/apartment Stairs: Yes: Internal: basement and upstairs; pt doesn't go up or down these often steps; outdoor steps (4) and handrail Has following equipment at home: None  PLOF: Independent and Leisure: enjoys yardwork  PATIENT GOALS:  Pt's goals for therapy are to get rid of the dizziness and help with the ability to read without dizziness    ------------------------------------------------  Note: Objective measures were completed at Evaluation unless  otherwise noted.  DIAGNOSTIC FINDINGS: MRI 06/13/23 IMPRESSION: MRI scan of the brain without contrast showing moderate changes of age-related chronic small vessel disease and high degree of generalized cerebral atrophy.  These changes appear slightly progressed compared with previous MRI from 2019.   IMPRESSION: MRI cervical spine without contrast showing prominent spondylitic changes from C3-C6 7 most prominent at C5-6 where there is multi canal narrowing with mild cord compression and bilateral foraminal narrowing.  C4-5 also shows mild canal narrowing with severe bilateral foraminal narrowing.   COGNITION: Overall cognitive status: History of cognitive impairments - at baseline    POSTURE:  rounded shoulders and forward head  Cervical ROM:    Active A/PROM (deg) eval  Flexion 22 with dizziness  Extension 31  Right lateral flexion   Left lateral flexion   Right rotation 33  Left rotation 35  (Blank rows = not tested) *generally swimmy headed with ROM testing*  GAIT: Gait pattern: WFL Distance walked: clinic distances Assistive device utilized: None Level of assistance: Complete Independence  PATIENT SURVEYS:  DHI 24  VESTIBULAR ASSESSMENT:  GENERAL OBSERVATION: Pt in no acute distress.   SYMPTOM BEHAVIOR:  Subjective history: Been having "holes in vision", headaches and dizziness, that have been going on for several years; worsening in prevalence in past 1-6 months.  Had virus in the past month, but it was not Covid  Non-Vestibular symptoms: headaches, migraine symptoms, and black spots/holes in vision  Type of dizziness: Imbalance (Disequilibrium), Unsteady with head/body turns, Lightheadedness/Faint, and "drunk feeling"  Frequency: >3x/wk  Duration: minutes  Aggravating factors: Induced by motion: turning body quickly and turning head quickly and definite changes in light (going from bright outside light to indoors)  Relieving factors: lying supine, closing eyes,  and rest  Progression of symptoms: worse  OCULOMOTOR EXAM:  Wears progressive lenses; blinks frequently and reports this is due to dry eyes  Ocular Alignment: normal  Ocular ROM: No Limitations  Spontaneous Nystagmus: absent  Gaze-Induced Nystagmus: absent  Smooth Pursuits: intact  Saccades: intact  Convergence/Divergence: 2 cm   VESTIBULAR - OCULAR REFLEX:   Slow VOR: Comment: horizontal 3/10 swimmy headed; no symptoms vertical  VOR Cancellation: Corrective Saccades with horizontal 6/10  Head-Impulse Test: HIT Right: positive HIT Left: positive Increased symptoms bilaterally  Dynamic Visual Acuity: NT   POSITIONAL TESTING: Other: NT due to time constraints and pt does not report any spinning sensations  MOTION SENSITIVITY:NT  ORTHOSTATICS: Seated 125/86, HR 70; standing 90/71, HR 96 bpm   M-CTSIB  Condition 1: Firm Surface, EO 30 Sec, Mild Sway  Condition 2: Firm Surface, EC 30 Sec, Moderate Sway  Condition 3: Foam Surface, EO 30 Sec, Mild Sway r  Condition 4: Foam Surface, EC 23 Sec, Severe Sway    VESTIBULAR TREATMENT:  06-26-23  NeuroRe-ed:  Pt performed sit to stand transfers 5x from mat with feet on Airex and with EC with SBA - no LOB  X1 viewing exercise: target 4' away - on plain background x1 Viewing Horizontal: Position: Standing, Time: 30 seconds,  1 rep - cues to perform with smaller ROM and more continuous movement as pt was slightly pausing between head turns Rt/Lt; minimal dizziness reported upon completion of exercise x1 Viewing Vertical:  standing, 30 sec, 1 rep;  pt reported no dizziness upon completion of exercise   Standing Balance: Surface: Airex Position: Narrow Base of Support Completed with: Eyes Open and Eyes Closed; Head Turns x 5 Reps and Head Nods x 5 Reps  Pt performed stepping down to floor and back up onto Airex with contralateral head turn 5 reps to each side with CGA to min assist for balance recovery  Marching on Airex in corner - 10  reps EC no head turns:  5 reps with head turns horizontally   Pt performed amb. Approx. 35' making circles CW x 2 reps, CCW x 2 reps tracking ball for improved gaze stabilization and balance with gait 35' x 1 rep making "+" pattern with ball with tracking ball Amb. 35' x 2 reps with horizontal head turns with SBA; 35' x 1 rep with vertical head turns  Rockerboard inside // bars - 10 reps with bil. UE support with EO; 10 reps without UE support with CGA prn for assist with balance; 10 reps with EC with UE support  Pt stood on blue mat on incline - performed marching in place 10 reps EO with focusing on target outside window; marching with head turns horizontally 5 reps EO and then vertically 5 reps EO Pt performed stepping  forward/back 5 reps each leg with CGA to min assist for balance         PATIENT EDUCATION: Education details: added standing on foam at home, making circles CW and CCW with ball or object for target;  also progressed x1 viewing ex to target 4'-5' away Person educated: Patient Education method: Explanation, Demonstration, Tactile cues, Verbal cues, and Handouts Education comprehension: verbalized understanding and returned demonstration   HOME EXERCISE PROGRAM  Access Code: [facility] URL: [email or web address] Date: 06/16/2023 Prepared by: [facility] - Outpatient  Rehab - [facility]  Exercises - 180 Degree Pivot Turn with Single Point Cane  - 1 x daily - 5 x weekly - 2 sets - 10 reps - Forward T with Counter Support  - 1 x daily - 7 x weekly - 5 reps - Walking with Head Rotation  - 1 x daily - 7 x weekly - 3 sets - 10 reps - Corner Balance Feet Together: Eyes Open With Head Turns  - 1 x daily - 7 x weekly - 1-2 sets - 5 reps - Corner Balance Feet Together: Eyes Closed With Head Turns  - 1 x daily - 7 x weekly - 1-2 sets - 5 reps - Standing Balance with Eyes Closed on Foam  - 1 x daily - 7 x weekly - 1 sets - 3 reps - 30 sec hold  Standing VOR  x1 in horizontal and vertical direction for 30 seconds    GOALS: Goals reviewed with patient? Yes  SHORT TERM GOALS: Target date: 06/06/2023  Pt will be independent with HEP for improved balance, decreased dizziness/headache symptoms. Baseline: Goal status: MET   LONG TERM GOALS: Target date: 07/04/2023  Pt will be independent with progression of HEP for improved balance, decreased dizziness and headache symptoms. Baseline:  Goal status: IN PROGRESS  2.  Pt will improve MCTSIB Condition 4 to 30 seconds with mod sway or less for improved balance and functional mobility. Baseline: mild-moderate Goal status: MET  3.  FGA to be assessed with pt to score >/=22/30 for decreased fall risk Baseline: 22/30 Goal status: MET  4.  DHI score to improve to </= 20 to demo decreased dizziness. Baseline:  Goal status: IN PROGRESS  ASSESSMENT:  CLINICAL IMPRESSION:            This 10th visit progress note covers dates 05-26-23 - 06-26-23;  pt has met LTG's #2 and 3 with LTG's #1 and 4 ongoing. PT session focused on activities to increase vestibular input in maintaining balance.  Pt is most challenged by activities with EC, and > with standing on compliant surface with EC due to symptoms consistent with vestibular hypofunction.  Pt reports symptoms/balance vary on daily basis but reports overall major improvement with PT and exercises.  Plan to place pt on hold after next 2 visits and reassess when she returns from trip to [place] (pt's request).  Cont with POC.    OBJECTIVE IMPAIRMENTS: decreased balance, decreased mobility, decreased ROM, dizziness, postural dysfunction, pain, and headaches .   ACTIVITY LIMITATIONS: standing, transfers, locomotion level, and caring for others  PARTICIPATION LIMITATIONS: meal prep, cleaning, laundry, driving, shopping, and community activity  PERSONAL FACTORS: 3+ comorbidities: see above  are also affecting patient's functional outcome.   REHAB POTENTIAL:  Good  CLINICAL DECISION MAKING: Stable/uncomplicated  EVALUATION COMPLEXITY: Low   PLAN:  PT FREQUENCY: 2x/week  PT DURATION: 6 weeks including eval week  PLANNED INTERVENTIONS: 97110-Therapeutic exercises, 97530- Therapeutic activity, [id number]- Neuromuscular re-education, 97535-  Self Care, 16109- Manual therapy, [phone]- Gait training, [phone]- Canalith repositioning, Patient/Family education, Balance training, and Vestibular training  PLAN FOR NEXT SESSION:   Balance exercises on compliant surfaces, vision removed, progress VOR exercises, vestibular input for balance    Kara Tapia, Donavan Burnet, PT 06/27/23 10:11 AM

## 2023-06-27 ENCOUNTER — Encounter: Payer: Self-pay | Admitting: Physical Therapy

## 2023-06-30 ENCOUNTER — Encounter (HOSPITAL_COMMUNITY): Payer: Self-pay

## 2023-06-30 ENCOUNTER — Ambulatory Visit: Admitting: Physical Therapy

## 2023-06-30 ENCOUNTER — Emergency Department (HOSPITAL_COMMUNITY): Admission: EM | Admit: 2023-06-30 | Discharge: 2023-06-30 | Disposition: A | Discharge status: Home / Self Care

## 2023-06-30 ENCOUNTER — Telehealth: Payer: Self-pay | Admitting: Internal Medicine

## 2023-06-30 ENCOUNTER — Other Ambulatory Visit: Payer: Self-pay

## 2023-06-30 ENCOUNTER — Encounter

## 2023-06-30 ENCOUNTER — Emergency Department (HOSPITAL_COMMUNITY)

## 2023-06-30 ENCOUNTER — Ambulatory Visit: Admitting: Internal Medicine

## 2023-06-30 DIAGNOSIS — R0602 Shortness of breath: Secondary | ICD-10-CM | POA: Insufficient documentation

## 2023-06-30 DIAGNOSIS — R42 Dizziness and giddiness: Secondary | ICD-10-CM | POA: Insufficient documentation

## 2023-06-30 DIAGNOSIS — R531 Weakness: Secondary | ICD-10-CM | POA: Diagnosis not present

## 2023-06-30 LAB — BASIC METABOLIC PANEL WITH GFR
Anion gap: 8 (ref 5–15)
BUN: 11 mg/dL (ref 8–23)
CO2: 27 mmol/L (ref 22–32)
Calcium: 9.4 mg/dL (ref 8.9–10.3)
Chloride: 99 mmol/L (ref 98–111)
Creatinine, Ser: 0.86 mg/dL (ref 0.44–1.00)
GFR, Estimated: >60 mL/min (ref >60)
Glucose, Bld: 86 mg/dL (ref 70–99)
Potassium: 4.3 mmol/L (ref 3.5–5.1)
Sodium: 134 mmol/L — ABNORMAL LOW (ref 135–145)

## 2023-06-30 LAB — TROPONIN I (HIGH SENSITIVITY)
Troponin I (High Sensitivity): <2 ng/L (ref <18)
Troponin I (High Sensitivity): 5 ng/L (ref <18)

## 2023-06-30 LAB — CBC
HCT: 43.1 % (ref 36.0–46.0)
Hemoglobin: 14.5 g/dL (ref 12.0–15.0)
MCH: 33.3 pg (ref 26.0–34.0)
MCHC: 33.6 g/dL (ref 30.0–36.0)
MCV: 99.1 fL (ref 80.0–100.0)
Platelets: 247 10*3/uL (ref 150–400)
RBC: 4.35 MIL/uL (ref 3.87–5.11)
RDW: 12.4 % (ref 11.5–15.5)
WBC: 4.7 10*3/uL (ref 4.0–10.5)
nRBC: 0 % (ref 0.0–0.2)

## 2023-06-30 LAB — URINALYSIS, W/ REFLEX TO CULTURE (INFECTION SUSPECTED)
Bilirubin Urine: NEGATIVE
Glucose, UA: NEGATIVE mg/dL
Ketones, ur: NEGATIVE mg/dL
Leukocytes,Ua: NEGATIVE
Nitrite: NEGATIVE
Protein, ur: NEGATIVE mg/dL
Specific Gravity, Urine: 1.01 (ref 1.005–1.030)
WBC, UA: NONE SEEN WBC/hpf (ref 0–5)
pH: 7.5 (ref 5.0–8.0)

## 2023-06-30 LAB — CBG MONITORING, ED: Glucose-Capillary: 112 mg/dL — ABNORMAL HIGH (ref 70–99)

## 2023-06-30 NOTE — Discharge Instructions (Signed)
 Please follow-up with your primary doctor.  Return immediately if develop fevers, chills, sudden onset headache, facial droop, unilateral weakness, chest pain, shortness of breath, palpitations, lightheadedness, passout, abdominal pain, nausea vomiting or any new or worsening symptoms that are concerning to you.

## 2023-06-30 NOTE — ED Triage Notes (Signed)
 Patient stated she feels anxious and short of breath that began after eating breakfast today. Feels lightheaded. Was supposed to go to her cardiologist today but felt worse. No vomiting.

## 2023-06-30 NOTE — ED Provider Notes (Signed)
 [place] EMERGENCY DEPARTMENT AT [facility] Provider Note   CSN: [id number] Arrival date & time: 06/30/23  7829     History  Chief Complaint  Patient presents with   Shortness of Breath    Kara Tapia is a 83 y.o. female.  This is an 83 year old female presenting to the emergency department with feeling jittery and with generalized weakness.  Symptoms that started this morning after eating breakfast.  Worsening shortness of breath and some lightheadedness reported as well.  Denies chest pain.  No infectious symptoms past several days.  No nausea vomiting diarrhea.  No near syncope/syncope.  No palpitations.   Shortness of Breath      Home Medications Prior to Admission medications   Medication Sig Start Date End Date Taking? Authorizing Provider  albuterol (VENTOLIN HFA) 108 (90 Base) MCG/ACT inhaler Inhale 2 puffs into the lungs every 4 (four) hours as needed. 05/08/23  Yes [provider]  Acetaminophen (TYLENOL PO) Take by mouth as needed.    [provider]  amLODipine (NORVASC) 2.5 MG tablet Take 2.5 mg by mouth daily. 09/24/21   [provider]  CALCIUM-MAGNESIUM-VITAMIN D PO Take 1 tablet by mouth daily.    [provider]  clonazePAM (KLONOPIN) 0.5 MG tablet Take 0.5 tablets (0.25 mg total) by mouth at bedtime. 04/10/23   Lomax, Amy, NP  donepezil (ARICEPT) 10 MG tablet Take 1 tablet (10 mg total) by mouth daily. 04/10/23   Lomax, Amy, NP  rosuvastatin (CRESTOR) 10 MG tablet Take 10 mg by mouth at bedtime. 12/29/22   [provider]  SUMAtriptan (IMITREX) 25 MG tablet Take 1 tablet (25 mg total) by mouth every 2 (two) hours as needed for migraine. May repeat in 2 hours if headache persists or recurs. 05/29/23   Dohmeier, Porfirio Mylar, MD      Allergies    Caine-1 [lidocaine]    Review of Systems   Review of Systems  Respiratory:  Positive for shortness of breath.     Physical Exam Updated Vital Signs BP 124/77    Pulse 90   Temp 97.9 F (36.6 C) (Oral)   Resp (!) 23   Ht 5\' 5"  (1.651 m)   Wt 59.9 kg   SpO2 98%   BMI 21.97 kg/m  Physical Exam Vitals and nursing note reviewed.  Constitutional:      General: She is not in acute distress.    Appearance: She is not toxic-appearing.  HENT:     Head: Normocephalic.  Cardiovascular:     Rate and Rhythm: Normal rate and regular rhythm.  Pulmonary:     Effort: Pulmonary effort is normal.     Breath sounds: No decreased breath sounds, wheezing, rhonchi or rales.  Musculoskeletal:        General: Normal range of motion.  Skin:    General: Skin is warm and dry.     Capillary Refill: Capillary refill takes less than 2 seconds.  Neurological:     Mental Status: She is alert and oriented to person, place, and time.  Psychiatric:        Mood and Affect: Mood normal.        Behavior: Behavior normal.     ED Results / Procedures / Treatments   Labs (all labs ordered are listed, but only abnormal results are displayed) Labs Reviewed  BASIC METABOLIC PANEL WITH GFR - Abnormal; Notable for the following components:      Result Value   Sodium 134 (*)  All other components within normal limits  URINALYSIS, W/ REFLEX TO CULTURE (INFECTION SUSPECTED) - Abnormal; Notable for the following components:   Hgb urine dipstick SMALL (*)    Bacteria, UA RARE (*)    All other components within normal limits  CBG MONITORING, ED - Abnormal; Notable for the following components:   Glucose-Capillary 112 (*)    All other components within normal limits  CBC  TROPONIN I (HIGH SENSITIVITY)  TROPONIN I (HIGH SENSITIVITY)    EKG EKG Interpretation Date/Time:  Monday June 30 2023 09:48:27 EDT Ventricular Rate:  94 PR Interval:  187 QRS Duration:  85 QT Interval:  383 QTC Calculation: 479 R Axis:   39  Text Interpretation: Unknown rhythm, irregular rate RSR' in V1 or V2, right VCD or RVH Confirmed by Estanislado Pandy [phone]) on 06/30/2023 10:34:00  AM  Radiology DG Chest 2 View Result Date: 06/30/2023 CLINICAL DATA:  Shortness of breath and feeling lightheaded EXAM: CHEST - 2 VIEW COMPARISON:  Chest radiograph dated 10/05/2018 FINDINGS: Normal lung volumes. No focal consolidations. No pleural effusion or pneumothorax. Enlarged cardiomediastinal silhouette is likely projectional. No acute osseous abnormality. IMPRESSION: No active cardiopulmonary disease. Electronically Signed   By: Agustin Cree M.D.   On: 06/30/2023 11:07    Procedures Procedures    Medications Ordered in ED Medications - No data to display  ED Course/ Medical Decision Making/ A&P Clinical Course as of 06/30/23 1718  Mon Jun 30, 2023  1222 MRI brain 06/13/23: " IMPRESSION: MRI scan of the brain without contrast showing moderate changes of age-related chronic small vessel disease and high degree of generalized cerebral atrophy.  These changes appear slightly progressed compared with previous MRI from 2019.   " [TY]  1222 Troponin I (High Sensitivity): <2 Reassuring. [TY]  1222 CBC No leukocytosis that would suggest infectious process.  No anemia to explain symptoms [TY]  1222 Basic metabolic panel(!) Minor hyponatremia, per chart review seems to be near baseline [TY]  1223 DG Chest 2 View No pneumonia or pneumothorax on my independent interpretation. [TY]  1223 DG Chest 2 View IMPRESSION: No active cardiopulmonary disease.   Electronically Signed   By: Agustin Cree M.D.   [TY]  [phone] Patient feeling improved.  Tolerating p.o.  Ambulatory to the bathroom.  Workup as noted in the ED course unremarkable.  Does have some centrally acting medications; Klonopin.  Presentation could be secondary to polypharmacy versus anxiety/panic.  Discussed follow-up with primary doctor for further evaluation.  Return precautions given. [TY]    Clinical Course User Index [TY] Coral Spikes, DO                                 Medical Decision Making This is an 83 year old  female presenting emergency department for generalized weakness, shortness of breath..  Per chart review has a history of hyponatremia, heart murmur, migraines, hypertension.  Does not appear to be in obvious distress on exam. Lungs clear.  No localized deficits on exam.  Patient appears to be in sinus rhythm on the monitor on my independent interpretation with a rate in the 70s.,  Initially hypertensive, improved without mention.  Will get screening cardiac labs.  And reevaluate.  See ED course for further MDM disposition.  Amount and/or Complexity of Data Reviewed Independent Historian:     Details: Daughter notes significant fluctuation of patient's blood pressure External Data Reviewed:     Details:  See ED course Labs: ordered. Decision-making details documented in ED Course. Radiology: ordered. Decision-making details documented in ED Course.  Risk Decision regarding hospitalization.          Final Clinical Impression(s) / ED Diagnoses Final diagnoses:  Weakness    Rx / DC Orders ED Discharge Orders     None         Coral Spikes, DO 06/30/23 1718

## 2023-06-30 NOTE — Telephone Encounter (Signed)
 Pt daughter called in asking to speak with Dr. Charlott Rakes or Dr. Lubertha Basque nurse.

## 2023-06-30 NOTE — Telephone Encounter (Signed)
 Kara Tapia [phone]    Pts daughter called to report that the pt called her daughter to come and pick her up early for her appt this morning since she was not feeling well but on the way to see Dr Tenny Craw... the pt says she thought she better to go to the ED.Marland Kitchen she was very SOB, dizzy, and unsteady on her feet. So her daughter took her to the ED for assessment.   They went to the ED and her workup was okay to send her home.... she was worried that her BP in the ED was 173/111.   I will send to Dr Tenny Craw and Dr Ladona Ridgel for review... she has an appt 07/10/23 with Dr Ladona Ridgel. She is currently still at the hospital but being sent home soon.

## 2023-07-02 ENCOUNTER — Observation Stay (HOSPITAL_COMMUNITY)
Admission: EM | Admit: 2023-07-02 | Discharge: 2023-07-04 | Disposition: A | Discharge status: Home / Self Care | Attending: Internal Medicine | Admitting: Internal Medicine

## 2023-07-02 ENCOUNTER — Other Ambulatory Visit: Payer: Self-pay

## 2023-07-02 ENCOUNTER — Emergency Department (HOSPITAL_COMMUNITY)

## 2023-07-02 ENCOUNTER — Encounter (HOSPITAL_COMMUNITY): Payer: Self-pay

## 2023-07-02 DIAGNOSIS — R42 Dizziness and giddiness: Secondary | ICD-10-CM | POA: Diagnosis not present

## 2023-07-02 DIAGNOSIS — E782 Mixed hyperlipidemia: Secondary | ICD-10-CM | POA: Diagnosis not present

## 2023-07-02 DIAGNOSIS — R0789 Other chest pain: Secondary | ICD-10-CM

## 2023-07-02 DIAGNOSIS — G3184 Mild cognitive impairment, so stated: Secondary | ICD-10-CM

## 2023-07-02 DIAGNOSIS — I25110 Atherosclerotic heart disease of native coronary artery with unstable angina pectoris: Secondary | ICD-10-CM | POA: Insufficient documentation

## 2023-07-02 DIAGNOSIS — Z79899 Other long term (current) drug therapy: Secondary | ICD-10-CM | POA: Insufficient documentation

## 2023-07-02 DIAGNOSIS — I2510 Atherosclerotic heart disease of native coronary artery without angina pectoris: Secondary | ICD-10-CM

## 2023-07-02 DIAGNOSIS — F17210 Nicotine dependence, cigarettes, uncomplicated: Secondary | ICD-10-CM | POA: Diagnosis not present

## 2023-07-02 DIAGNOSIS — J449 Chronic obstructive pulmonary disease, unspecified: Secondary | ICD-10-CM | POA: Diagnosis not present

## 2023-07-02 DIAGNOSIS — R0609 Other forms of dyspnea: Secondary | ICD-10-CM

## 2023-07-02 DIAGNOSIS — Z85828 Personal history of other malignant neoplasm of skin: Secondary | ICD-10-CM | POA: Diagnosis not present

## 2023-07-02 DIAGNOSIS — I10 Essential (primary) hypertension: Secondary | ICD-10-CM | POA: Diagnosis not present

## 2023-07-02 DIAGNOSIS — R079 Chest pain, unspecified: Secondary | ICD-10-CM | POA: Diagnosis not present

## 2023-07-02 DIAGNOSIS — E871 Hypo-osmolality and hyponatremia: Secondary | ICD-10-CM

## 2023-07-02 DIAGNOSIS — F0390 Unspecified dementia without behavioral disturbance: Secondary | ICD-10-CM | POA: Diagnosis not present

## 2023-07-02 DIAGNOSIS — I200 Unstable angina: Secondary | ICD-10-CM | POA: Diagnosis not present

## 2023-07-02 LAB — BASIC METABOLIC PANEL WITH GFR
Anion gap: 7 (ref 5–15)
BUN: 10 mg/dL (ref 8–23)
CO2: 26 mmol/L (ref 22–32)
Calcium: 9.2 mg/dL (ref 8.9–10.3)
Chloride: 97 mmol/L — ABNORMAL LOW (ref 98–111)
Creatinine, Ser: 0.83 mg/dL (ref 0.44–1.00)
GFR, Estimated: >60 mL/min (ref >60)
Glucose, Bld: 123 mg/dL — ABNORMAL HIGH (ref 70–99)
Potassium: 3.7 mmol/L (ref 3.5–5.1)
Sodium: 130 mmol/L — ABNORMAL LOW (ref 135–145)

## 2023-07-02 LAB — CBC
HCT: 39.6 % (ref 36.0–46.0)
Hemoglobin: 13.1 g/dL (ref 12.0–15.0)
MCH: 32.9 pg (ref 26.0–34.0)
MCHC: 33.1 g/dL (ref 30.0–36.0)
MCV: 99.5 fL (ref 80.0–100.0)
Platelets: 229 10*3/uL (ref 150–400)
RBC: 3.98 MIL/uL (ref 3.87–5.11)
RDW: 12.2 % (ref 11.5–15.5)
WBC: 5.6 10*3/uL (ref 4.0–10.5)
nRBC: 0 % (ref 0.0–0.2)

## 2023-07-02 LAB — SODIUM, URINE, RANDOM: Sodium, Ur: 56 mmol/L

## 2023-07-02 LAB — CREATININE, URINE, RANDOM: Creatinine, Urine: 182 mg/dL

## 2023-07-02 LAB — HEPATIC FUNCTION PANEL
ALT: 20 U/L (ref 0–44)
AST: 22 U/L (ref 15–41)
Albumin: 3.8 g/dL (ref 3.5–5.0)
Alkaline Phosphatase: 55 U/L (ref 38–126)
Bilirubin, Direct: 0.1 mg/dL (ref 0.0–0.2)
Indirect Bilirubin: 0.6 mg/dL (ref 0.3–0.9)
Total Bilirubin: 0.7 mg/dL (ref 0.0–1.2)
Total Protein: 6.4 g/dL — ABNORMAL LOW (ref 6.5–8.1)

## 2023-07-02 LAB — CK: Total CK: 85 U/L (ref 38–234)

## 2023-07-02 LAB — PHOSPHORUS: Phosphorus: 3.2 mg/dL (ref 2.5–4.6)

## 2023-07-02 LAB — TROPONIN I (HIGH SENSITIVITY)
Troponin I (High Sensitivity): 3 ng/L (ref <18)
Troponin I (High Sensitivity): 4 ng/L (ref <18)

## 2023-07-02 LAB — MAGNESIUM: Magnesium: 2 mg/dL (ref 1.7–2.4)

## 2023-07-02 MED ORDER — ONDANSETRON HCL 4 MG/2ML IJ SOLN: 4.0000 mg | Freq: Four times a day (QID) | INTRAMUSCULAR | Status: DC | PRN

## 2023-07-02 MED ORDER — CLONAZEPAM 0.125 MG PO TBDP
0.2500 mg | ORAL_TABLET | Freq: Once | ORAL | Status: AC
  Administered 2023-07-03: 0.25 mg via ORAL
  Filled 2023-07-02: qty 2

## 2023-07-02 MED ORDER — ACETAMINOPHEN 325 MG PO TABS: 650.0000 mg | ORAL_TABLET | Freq: Four times a day (QID) | ORAL | Status: DC | PRN

## 2023-07-02 MED ORDER — ROSUVASTATIN CALCIUM 20 MG PO TABS: 10.0000 mg | ORAL_TABLET | Freq: Every day | ORAL | Status: DC

## 2023-07-02 MED ORDER — ACETAMINOPHEN 650 MG RE SUPP: 650.0000 mg | Freq: Four times a day (QID) | RECTAL | Status: DC | PRN

## 2023-07-02 MED ORDER — MELATONIN 3 MG PO TABS: 3.0000 mg | ORAL_TABLET | Freq: Every day | ORAL | Status: DC

## 2023-07-02 MED ORDER — SODIUM CHLORIDE 0.9 % IV SOLN: INTRAVENOUS | Status: AC

## 2023-07-02 MED ORDER — HYDROCODONE-ACETAMINOPHEN 5-325 MG PO TABS: 1.0000 | ORAL_TABLET | ORAL | Status: DC | PRN

## 2023-07-02 MED ORDER — FAMOTIDINE 20 MG PO TABS
20.0000 mg | ORAL_TABLET | Freq: Once | ORAL | Status: AC
  Administered 2023-07-02: 20 mg via ORAL
  Filled 2023-07-02: qty 1

## 2023-07-02 MED ORDER — ROSUVASTATIN CALCIUM 10 MG PO TABS
10.0000 mg | ORAL_TABLET | Freq: Every day | ORAL | Status: DC
Start: 2023-07-03 — End: 2023-07-04
  Administered 2023-07-03 – 2023-07-04 (×2): 10 mg via ORAL
  Filled 2023-07-02 (×2): qty 1

## 2023-07-02 MED ORDER — ONDANSETRON HCL 4 MG PO TABS
4.0000 mg | ORAL_TABLET | Freq: Four times a day (QID) | ORAL | Status: DC | PRN
  Administered 2023-07-04: 4 mg via ORAL
  Filled 2023-07-02: qty 1

## 2023-07-02 MED ORDER — ASPIRIN 81 MG PO CHEW
324.0000 mg | CHEWABLE_TABLET | Freq: Once | ORAL | Status: AC
  Administered 2023-07-02: 324 mg via ORAL
  Filled 2023-07-02: qty 4

## 2023-07-02 MED ORDER — NITROGLYCERIN 0.4 MG SL SUBL: 0.4000 mg | SUBLINGUAL_TABLET | SUBLINGUAL | Status: DC | PRN

## 2023-07-02 MED ORDER — ALUM & MAG HYDROXIDE-SIMETH 200-200-20 MG/5ML PO SUSP
30.0000 mL | Freq: Once | ORAL | Status: AC
  Administered 2023-07-02: 30 mL via ORAL
  Filled 2023-07-02: qty 30

## 2023-07-02 MED ORDER — DONEPEZIL HCL 10 MG PO TABS
10.0000 mg | ORAL_TABLET | Freq: Every day | ORAL | Status: DC
  Administered 2023-07-03 – 2023-07-04 (×2): 10 mg via ORAL
  Filled 2023-07-02 (×2): qty 1

## 2023-07-02 NOTE — Assessment & Plan Note (Signed)
 Labile recently her PCP has taken her off of Norvasc because blood pressure was dropping she restarted in the past few days because family was concerned because blood pressure was going up into 140-160s. Family member who is internal medicine first year resident's is suggested for family to have patient tested for pheochromocytoma. Discussed with family at length recommend if they are interested in pursuing this to have this done as an outpatient setting  Other differential diagnosis for labile blood pressure would be dysautonomia In elderly avoiding hypotension would be paramount to avoid falls and injury For tonight continue to hold Norvasc blood pressure will be monitored carefully recommend following up with primary care provider regarding long-term management may also benefit fromcardiology visit

## 2023-07-02 NOTE — Subjective & Objective (Signed)
 Pt Had substernal pressure 2 days ago was negative trop Occurs at rest, not relieved by anything Hx of GERD  With persistent chest pressure  Some lightheadedness Family wanted her to be seen and possibly get a stress test Has labile BP

## 2023-07-02 NOTE — H&P (Signed)
 Kara Tapia:[id number] DOB: 1940/06/09 DOA: 07/02/2023     PCP: Melida Quitter, MD   Outpatient Specialists:   CARDS: Dr.  Ladona Ridgel    NEurology    Dr. Lahoma Rocker  Patient arrived to ER on 07/02/23 at 1303 Referred by Attending Franne Forts, DO   Patient coming from:    home Lives With family     Chief Complaint:   Chief Complaint  Patient presents with   Chest Pain    HPI: Kara Tapia is a 83 y.o. female with medical history significant of  GERD, dementia, Vertigo, hypertension    Presented with  chest pressure Pt Had substernal pressure 2 days ago was negative trop Occurs at rest, not relieved by anything Hx of GERD  With persistent chest pressure  Some lightheadedness Family wanted her to be seen and possibly get a stress test Has labile BP    Pt got up had a normal AM and then had to lay down bc she felt fatigued and lightheaded She has had those episodes That has cut down on her normal activities  Her PCP was worried about over controlling her BP bc it drops down rapidly  Family states patient eats like a bird but they have been trying to encourage her to drink plenty of water they bought her a 16 ounce jug of water encouraged her to drink     Denies significant ETOH intake   Does not smoke   Lab Results  Component Value Date   SARSCOV2NAA NEGATIVE 10/05/2018       Regarding pertinent Chronic problems:    Hyperlipidemia -  on statins Crestor Lipid Panel  No results found for: "CHOL", "TRIG", "HDL", "CHOLHDL", "VLDL", "LDLCALC", "LDLDIRECT", "LABVLDL"   HTN on Norvasc    last echo  Recent Results (from the past 95621 hours)  ECHOCARDIOGRAM COMPLETE   Collection Time: 11/24/18  1:43 PM   Narrative     ECHOCARDIOGRAM REPORT     Sonographer Comments: Respiratory motion. IMPRESSIONS    1. The left ventricle has normal systolic function with an ejection fraction of 60-65%. The cavity size was normal. Left ventricular diastolic Doppler  parameters are consistent with pseudonormalization.  2. The right ventricle has normal systolic function. The cavity was normal. There is no increase in right ventricular wall thickness.  3. No evidence of mitral valve stenosis.  4. No stenosis of the aortic valve.  5. The aorta is abnormal unless otherwise noted.  6. There is mild dilatation of the ascending aorta.  7. The atrial septum is grossly normal.         COPD - not  followed by pulmonology   not  on baseline oxygen         OSA - noncompliant with CPAP      Mild Dementia - on Aricept    While in ER:    Troponin negative ECG non acute    Lab Orders         Basic metabolic panel         CBC      CXR -  NON acute   Following Medications were ordered in ER: Medications  nitroGLYCERIN (NITROSTAT) SL tablet 0.4 mg (has no administration in time range)  alum & mag hydroxide-simeth (MAALOX/MYLANTA) 200-200-20 MG/5ML suspension 30 mL (30 mLs Oral Given 07/02/23 1738)  famotidine (PEPCID) tablet 20 mg (20 mg Oral Given 07/02/23 1738)  aspirin chewable tablet 324 mg (324 mg Oral Given  07/02/23 1856)    _______________________________________________________ ER Provider Called:    [facility]   Dr.Sullivan  They Recommend admit to medicine   Will see in AM        ED Triage Vitals  Encounter Vitals Group     BP 07/02/23 1310 (!) 133/92     Systolic BP Percentile --      Diastolic BP Percentile --      Pulse Rate 07/02/23 1310 82     Resp 07/02/23 1310 18     Temp 07/02/23 1310 97.8 F (36.6 C)     Temp Source 07/02/23 1310 Oral     SpO2 07/02/23 1310 100 %     Weight 07/02/23 1309 132 lb (59.9 kg)     Height 07/02/23 1309 5\' 5"  (1.651 m)     Head Circumference --      Peak Flow --      Pain Score 07/02/23 1309 7     Pain Loc --      Pain Education --      Exclude from Growth Chart --   ZOXW(96)@     _________________________________________ Significant initial  Findings: Abnormal Labs Reviewed  BASIC METABOLIC  PANEL WITH GFR - Abnormal; Notable for the following components:      Result Value   Sodium 130 (*)    Chloride 97 (*)    Glucose, Bld 123 (*)    All other components within normal limits      _________________________ Troponin   Cardiac Panel (last 3 results) Recent Labs    06/30/23 1147 07/02/23 1340 07/02/23 1925  CKTOTAL  --   --  85  TROPONINIHS 5 3 4      ECG: Ordered Personally reviewed and interpreted by me showing: HR : 94 Rhythm:  NSR,  no evidence of ischemic changes QTC 479  BNP (last 3 results) No results for input(s): "BNP" in the last 8760 hours.   COVID-19 Labs  No results for input(s): "DDIMER", "FERRITIN", "LDH", "CRP" in the last 72 hours.  Lab Results  Component Value Date   SARSCOV2NAA NEGATIVE 10/05/2018    ____________________ This patient meets SIRS Criteria and may be septic.  The recent clinical data is shown below. Vitals:   07/02/23 1545 07/02/23 1730 07/02/23 1745 07/02/23 1800  BP: 104/64 (!) 143/86 (!) 160/88 (!) 155/106  Pulse: 62 69 68 62  Resp: (!) 21 20 (!) 27 17  Temp: 98.4 F (36.9 C)     TempSrc: Oral     SpO2: 98% 95% 98% 96%  Weight:      Height:         WBC     Component Value Date/Time   WBC 5.6 07/02/2023 1340   LYMPHSABS 1.8 05/21/2023 0932   MONOABS 0.3 10/02/2021 1643   EOSABS 0.1 05/21/2023 0932   BASOSABS 0.1 05/21/2023 0932     Lactic Acid, Venous No results found for: "LATICACIDVEN"   Procalcitonin   Ordered      UA   no evidence of UTI      Urine analysis:    Component Value Date/Time   COLORURINE YELLOW 06/30/2023 1008   APPEARANCEUR CLEAR 06/30/2023 1008   LABSPEC 1.010 06/30/2023 1008   PHURINE 7.5 06/30/2023 1008   GLUCOSEU NEGATIVE 06/30/2023 1008   HGBUR SMALL (A) 06/30/2023 1008   BILIRUBINUR NEGATIVE 06/30/2023 1008   KETONESUR NEGATIVE 06/30/2023 1008   PROTEINUR NEGATIVE 06/30/2023 1008   UROBILINOGEN 0.2 05/13/2011 1713   NITRITE NEGATIVE 06/30/2023  1008   LEUKOCYTESUR  NEGATIVE 06/30/2023 1008    Results for orders placed or performed in visit on 10/29/22  Calprotectin, Fecal     Status: None   Collection Time: 10/29/22  3:39 PM   Specimen: Stool  Result Value Ref Range Status   Calprotectin, Fecal 69 0 - 120 ug/g Final    Comment: Concentration     Interpretation   Follow-Up < 5 - 50 ug/g     Normal           None >50 -120 ug/g     Borderline       Re-evaluate in 4-6 weeks     >120 ug/g     Abnormal         Repeat as clinically                                    indicated     __________________________________________________________ Recent Labs  Lab 06/30/23 0955 07/02/23 1340 07/02/23 1925  NA 134* 130*  --   K 4.3 3.7  --   CO2 27 26  --   GLUCOSE 86 123*  --   BUN 11 10  --   CREATININE 0.86 0.83  --   CALCIUM 9.4 9.2  --   MG  --   --  2.0  PHOS  --   --  3.2    Cr   stable,   Lab Results  Component Value Date   CREATININE 0.83 07/02/2023   CREATININE 0.86 06/30/2023   CREATININE 0.93 05/21/2023    Recent Labs  Lab 07/02/23 1925  AST 22  ALT 20  ALKPHOS 55  BILITOT 0.7  PROT 6.4*  ALBUMIN 3.8   Lab Results  Component Value Date   CALCIUM 9.2 07/02/2023       Plt: Lab Results  Component Value Date   PLT 229 07/02/2023    Recent Labs  Lab 06/30/23 0955 07/02/23 1340  WBC 4.7 5.6  HGB 14.5 13.1  HCT 43.1 39.6  MCV 99.1 99.5  PLT 247 229    HG/HCT stable,      Component Value Date/Time   HGB 13.1 07/02/2023 1340   HGB 14.9 05/21/2023 0932   HCT 39.6 07/02/2023 1340   HCT 43.6 05/21/2023 0932   MCV 99.5 07/02/2023 1340   MCV 100 (H) 05/21/2023 0932     _______________________________________________ Hospitalist was called for admission for  Chest pain  The following Work up has been ordered so far:  Orders Placed This Encounter  Procedures   DG Chest 2 View   Basic metabolic panel   CBC   Document Height and Actual Weight   Orthostatic vital signs   ED Cardiac monitoring   Inpatient  consult to Cardiology   Consult to hospitalist   ED EKG   EKG 12-Lead     OTHER Significant initial  Findings:  labs showing:     DM  labs:  HbA1C: Recent Labs    05/21/23 0932  HGBA1C 5.6    CBG (last 3)  Recent Labs    06/30/23 1011  GLUCAP 112*          Cultures: No results found for: "SDES", "SPECREQUEST", "CULT", "REPTSTATUS"   Radiological Exams on Admission: DG Chest 2 View Result Date: 07/02/2023 CLINICAL DATA:  Chest pressure EXAM: CHEST - 2 VIEW COMPARISON:  None Available. FINDINGS: Heart size is unchanged. No  focal consolidation, pleural effusion, or pneumothorax. IMPRESSION: No active cardiopulmonary disease. Electronically Signed   By: Lowell Guitar M.D.   On: 07/02/2023 14:41   _______________________________________________________________________________________________________ Latest  Blood pressure (!) 155/106, pulse 62, temperature 98.4 F (36.9 C), temperature source Oral, resp. rate 17, height 5\' 5"  (1.651 m), weight 59.9 kg, SpO2 96%.   Vitals  labs and radiology finding personally reviewed  Review of Systems:    Pertinent positives include:   chest pain,   Constitutional:  No weight loss, night sweats, Fevers, chills, fatigue, weight loss  HEENT:  No headaches, Difficulty swallowing,Tooth/dental problems,Sore throat,  No sneezing, itching, ear ache, nasal congestion, post nasal drip,  Cardio-vascular:  NoOrthopnea, PND, anasarca, dizziness, palpitations.no Bilateral lower extremity swelling  GI:  No heartburn, indigestion, abdominal pain, nausea, vomiting, diarrhea, change in bowel habits, loss of appetite, melena, blood in stool, hematemesis Resp:  no shortness of breath at rest. No dyspnea on exertion, No excess mucus, no productive cough, No non-productive cough, No coughing up of blood.No change in color of mucus.No wheezing. Skin:  no rash or lesions. No jaundice GU:  no dysuria, change in color of urine, no urgency or frequency. No  straining to urinate.  No flank pain.  Musculoskeletal:  No joint pain or no joint swelling. No decreased range of motion. No back pain.  Psych:  No change in mood or affect. No depression or anxiety. No memory loss.  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 She has never been exposed to tobacco smoke. She has never used smokeless tobacco. She reports current alcohol use of about 5.0 standard drinks of alcohol per week. She reports that she does not use drugs.    Family History:   Family History  Problem Relation Age of Onset   Diabetes Mother    Heart disease Mother    Cancer Mother        type unknown   Bone cancer Father    Diabetes Sister    Lung disease Sister    COPD Sister    Heart attack Paternal  Grandfather    Down syndrome Daughter    Dementia Daughter    Other Daughter        colon surgery   ______________________________________________________________________________________________ Allergies: Allergies  Allergen Reactions   Caine-1 [Lidocaine] Rash and Other (See Comments)    Affects her BP     Prior to Admission medications   Medication Sig Start Date End Date Taking? Authorizing Provider  Acetaminophen (TYLENOL PO) Take by mouth as needed.    [provider]  albuterol (VENTOLIN HFA) 108 (90 Base) MCG/ACT inhaler Inhale 2 puffs into the lungs every 4 (four) hours as needed. 05/08/23   [provider]  amLODipine (NORVASC) 2.5 MG tablet Take 2.5 mg by mouth daily. 09/24/21   [provider]  CALCIUM-MAGNESIUM-VITAMIN D PO Take 1 tablet by mouth daily.    [provider]  clonazePAM (KLONOPIN) 0.5 MG tablet Take 0.5 tablets (0.25 mg total) by mouth at bedtime. 04/10/23   Lomax, Amy, NP  donepezil (ARICEPT) 10 MG tablet Take 1 tablet (10 mg total) by mouth daily. 04/10/23   Lomax, Amy, NP  rosuvastatin (CRESTOR) 10 MG tablet Take 10 mg by mouth at bedtime. 12/29/22   [provider]  SUMAtriptan (IMITREX) 25 MG tablet Take 1 tablet (25 mg total) by mouth every 2 (two) hours as needed for migraine. May repeat in 2 hours if headache persists or recurs. 05/29/23   Dohmeier, Porfirio Mylar, MD    ___________________________________________________________________________________________________ Physical Exam:    07/02/2023    6:00 PM 07/02/2023    5:45 PM 07/02/2023    5:30 PM  Vitals with BMI  Systolic 155 160 161  Diastolic 106 88 86  Pulse 62 68 69     1. General:  in No  Acute distress    Chronically ill-appearing 2. Psychological: Alert and   Oriented 3. Head/ENT:    Dry Mucous Membranes                          Head Non traumatic, neck supple                          Poor Dentition 4. SKIN:  decreased Skin turgor,  Skin clean Dry and intact no rash    5. Heart: Regular rate and rhythm no  Murmur, no Rub or gallop 6. Lungs  no wheezes or crackles   7. Abdomen: Soft,  non-tender, Non distended bowel sounds present 8. Lower extremities: no clubbing, cyanosis, no  edema 9. Neurologically Grossly intact, moving all 4 extremities equally   10. MSK: Normal range of motion Chest wall tenderness around sternum and zygomatic process    Chart has been reviewed  ______________________________________________________________________________________________  Assessment/Plan 83 y.o. female with medical history significant of  GERD, dementia, Vertigo, hypertension   Admitted for Chest pain and hyponatremia   Present on Admission:  Chest pain  Amnestic MCI (mild cognitive impairment with memory loss)  COPD mixed type (HCC)  Hypertension  Mixed hyperlipidemia  Hyponatremia     Amnestic MCI (mild cognitive impairment with memory loss) For tonight continue Aricept Monitor for any sign of sundowning  Chest pain Seems to be musculoskeletal in nature reproducible by palpation I suspect costochondritis may benefit from pain management have to be very careful with NSAIDs in elderly could consider low-dose Given her advanced age and family concern we will order echogram and monitor on telemetry troponin has been unremarkable EKG nonischemic Patient has  follow-up with cardiology   COPD mixed type (HCC) Chronic stable  Hypertension Labile recently her PCP has taken her off of Norvasc because blood pressure was dropping she restarted in the past few days because family was concerned because blood pressure was going up into 140-160s. Family member who is internal medicine  first year resident's is suggested for family to have patient tested for pheochromocytoma. Discussed with family at length recommend if they are interested in pursuing this to have this done as an outpatient setting  Other differential diagnosis for labile blood pressure would be dysautonomia In elderly avoiding hypotension would be paramount to avoid falls and injury For tonight continue to hold Norvasc blood pressure will be monitored carefully recommend following up with primary care provider regarding long-term management may also benefit fromcardiology visit   Mixed hyperlipidemia Continue Crestor 10 mg a day  Hyponatremia Patient's fatigue and feeling of lightheadedness could be in the setting of fluid imbalance in the setting of hyponatremia.  Per family she does not eat a lot but they have tried to encourage her p.o. intake of water bought her a large jug of water about 16 ounce and encouraged her to drink. They are not quite sure if she has been compliant or not Educated family and patient regarding importance of solute intake not just plain water for rehydration   Other plan as per orders.  DVT prophylaxis:  SCD      Code Status:    Code Status: Not on file FULL CODE     as per patient   I had personally discussed CODE STATUS with patient   ACP   none    Family Communication:   Family  at  Bedside  plan of care was discussed   with   Daughter,   Diet regular   Disposition Plan:           To home once workup is complete and patient is stable   Following barriers for discharge:                             Chest pain   work up is complete                            Electrolytes corrected                               Anemia corrected h/H stable                             Pain controlled with PO medications                               Afebrile, white count improving able to transition to PO antibiotics                             Will need to be able to tolerate PO                             Will likely need home health, home O2, set up  Will need consultants to evaluate patient prior to discharge       Consult Orders  (From admission, onward)           Start     Ordered   07/02/23 1844  Consult to hospitalist  Once       Provider:  (Not yet assigned)  Question Answer Comment  Place call to: [facility]   Reason for Consult Admit      07/02/23 1843                               Would benefit from PT/OT eval prior to DC  Ordered                                       Consults called:  cardiology   Admission status:  ED Disposition     ED Disposition  Admit   Condition  --   Comment  Hospital Area: [facility] [[id number]]  Level of Care: Progressive [102]  Admit to Progressive based on following criteria: CARDIOVASCULAR & THORACIC of moderate stability with acute coronary syndrome symptoms/low risk myocardial infarction/hypertensive urgency/arrhythmias/heart failure potentially compromising stability and stable post cardiovascular intervention patients.  May place patient in observation at [facility] or [facility] if equivalent level of care is available:: No  Covid Evaluation: Asymptomatic - no recent exposure (last 10 days) testing not required  Diagnosis: Chest pain [[id number]]  Admitting Physician: Therisa Doyne [[id number]]  Attending Physician: Therisa Doyne [[id number]]          Obs      Level of care    progressive       Therisa Doyne 07/02/2023, 9:28 PM    [facility]     after 2 AM please page floor coverage PA If 7AM-7PM, please contact the day team taking care of the patient using [email or web address]

## 2023-07-02 NOTE — Assessment & Plan Note (Signed)
 Chronic-stable.

## 2023-07-02 NOTE — Assessment & Plan Note (Addendum)
 Seems to be musculoskeletal in nature reproducible by palpation I suspect costochondritis may benefit from pain management have to be very careful with NSAIDs in elderly could consider low-dose Given her advanced age and family concern we will order echogram and monitor on telemetry troponin has been unremarkable EKG nonischemic Patient has follow-up with cardiology

## 2023-07-02 NOTE — Telephone Encounter (Signed)
 Pt daughter asking for a c/b

## 2023-07-02 NOTE — Assessment & Plan Note (Addendum)
 Patient's fatigue and feeling of lightheadedness could be in the setting of fluid imbalance in the setting of hyponatremia.  Per family she does not eat a lot but they have tried to encourage her p.o. intake of water bought her a large jug of water about 16 ounce and encouraged her to drink. They are not quite sure if she has been compliant or not Educated family and patient regarding importance of solute intake not just plain water for rehydration

## 2023-07-02 NOTE — Telephone Encounter (Signed)
 Spoke with daughter per DPR and she wanted to see if Dr. Tenny Craw had a sooner appointment than next week. She is aware we do not have any sooner appointments. Patient went to ED and was told to follow up with cardiology. She does have appointment scheduled with Dr. Ladona Ridgel.   She states her symptoms are not getting worse but patient is still have no energy. She is eating and staying hydrated. Advised if symptoms are getting worse she can go back to the ED.

## 2023-07-02 NOTE — Assessment & Plan Note (Signed)
 For tonight continue Aricept Monitor for any sign of sundowning

## 2023-07-02 NOTE — Assessment & Plan Note (Signed)
Continue Crestor 10 mg a day.

## 2023-07-02 NOTE — ED Provider Notes (Signed)
 [place] EMERGENCY DEPARTMENT AT [facility] Provider Note   CSN: [id number] Arrival date & time: 07/02/23  1303     History  Chief Complaint  Patient presents with   Chest Pain    Kara Tapia is a 83 y.o. female history of MCI, hypertension, COPD, GERD, reflux esophagitis presented for chest pain for the past 2 days.  Patient was seen 2 days ago for this and ultimately discharged with reassuring workup but states she has had persistent substernal chest pain.  Patient states that nothing makes the pain better or worse.  Patient denies any radiation of this pain.  Patient states it is constant and does not radiate to the back or to her head or left arm.  Patient denies any nausea vomiting or abdominal pain and cannot say if food is related to this.  Patient was told to follow-up with her cardiologist however cannot get into the [facility].  Patient and daughter are requesting admission for echo and stress test they are concerned patient is having a heart attack.    Home Medications Prior to Admission medications   Medication Sig Start Date End Date Taking? Authorizing Provider  Acetaminophen (TYLENOL PO) Take by mouth as needed.    [provider]  albuterol (VENTOLIN HFA) 108 (90 Base) MCG/ACT inhaler Inhale 2 puffs into the lungs every 4 (four) hours as needed. 05/08/23   [provider]  amLODipine (NORVASC) 2.5 MG tablet Take 2.5 mg by mouth daily. 09/24/21   [provider]  CALCIUM-MAGNESIUM-VITAMIN D PO Take 1 tablet by mouth daily.    [provider]  clonazePAM (KLONOPIN) 0.5 MG tablet Take 0.5 tablets (0.25 mg total) by mouth at bedtime. 04/10/23   Lomax, Amy, NP  donepezil (ARICEPT) 10 MG tablet Take 1 tablet (10 mg total) by mouth daily. 04/10/23   Lomax, Amy, NP  rosuvastatin (CRESTOR) 10 MG tablet Take 10 mg by mouth at bedtime. 12/29/22   [provider]  SUMAtriptan (IMITREX) 25 MG tablet Take 1 tablet (25 mg total) by mouth  every 2 (two) hours as needed for migraine. May repeat in 2 hours if headache persists or recurs. 05/29/23   Dohmeier, Porfirio Mylar, MD      Allergies    Caine-1 [lidocaine]    Review of Systems   Review of Systems  Cardiovascular:  Positive for chest pain.    Physical Exam Updated Vital Signs BP (!) 149/92   Pulse 61   Temp 98 F (36.7 C)   Resp (!) 24   Ht 5\' 5"  (1.651 m)   Wt 59.9 kg   SpO2 97%   BMI 21.97 kg/m  Physical Exam Vitals reviewed.  Constitutional:      General: She is not in acute distress. HENT:     Head: Normocephalic and atraumatic.  Eyes:     Extraocular Movements: Extraocular movements intact.     Conjunctiva/sclera: Conjunctivae normal.     Pupils: Pupils are equal, round, and reactive to light.  Cardiovascular:     Rate and Rhythm: Normal rate and regular rhythm.     Pulses: Normal pulses.     Heart sounds: Normal heart sounds.     Comments: 2+ bilateral radial/dorsalis pedis pulses with regular rate Pulmonary:     Effort: Pulmonary effort is normal. No respiratory distress.     Breath sounds: Normal breath sounds.  Abdominal:     Palpations: Abdomen is soft.     Tenderness: There is no abdominal tenderness.  There is no guarding or rebound.  Musculoskeletal:        General: Normal range of motion.     Cervical back: Normal range of motion and neck supple.     Right lower leg: No tenderness. No edema.     Left lower leg: No tenderness. No edema.     Comments: 5 out of 5 bilateral grip/leg extension strength  Skin:    General: Skin is warm and dry.     Capillary Refill: Capillary refill takes less than 2 seconds.  Neurological:     General: No focal deficit present.     Mental Status: She is alert and oriented to person, place, and time.     Comments: Sensation intact in all 4 limbs  Psychiatric:        Mood and Affect: Mood normal.     ED Results / Procedures / Treatments   Labs (all labs ordered are listed, but only abnormal results are  displayed) Labs Reviewed  BASIC METABOLIC PANEL WITH GFR - Abnormal; Notable for the following components:      Result Value   Sodium 130 (*)    Chloride 97 (*)    Glucose, Bld 123 (*)    All other components within normal limits  HEPATIC FUNCTION PANEL - Abnormal; Notable for the following components:   Total Protein 6.4 (*)    All other components within normal limits  CBC  MAGNESIUM  PHOSPHORUS  CK  OSMOLALITY, URINE  CBC WITH DIFFERENTIAL/PLATELET  CREATININE, URINE, RANDOM  SODIUM, URINE, RANDOM  TSH  CBC WITH DIFFERENTIAL/PLATELET  OSMOLALITY  MAGNESIUM  PHOSPHORUS  COMPREHENSIVE METABOLIC PANEL WITH GFR  CBC  BASIC METABOLIC PANEL WITH GFR  TSH  TROPONIN I (HIGH SENSITIVITY)  TROPONIN I (HIGH SENSITIVITY)    EKG EKG Interpretation Date/Time:  Wednesday July 02 2023 13:17:05 EDT Ventricular Rate:  70 PR Interval:  163 QRS Duration:  95 QT Interval:  418 QTC Calculation: 451 R Axis:   20  Text Interpretation: Sinus rhythm Atrial premature complexes RSR' in V1 or V2, probably normal variant Confirmed by Edwin Dada ([id number]) on 07/02/2023 5:25:53 PM  Radiology DG Chest 2 View Result Date: 07/02/2023 CLINICAL DATA:  Chest pressure EXAM: CHEST - 2 VIEW COMPARISON:  None Available. FINDINGS: Heart size is unchanged. No focal consolidation, pleural effusion, or pneumothorax. IMPRESSION: No active cardiopulmonary disease. Electronically Signed   By: Lowell Guitar M.D.   On: 07/02/2023 14:41    Procedures .Critical Care  Performed by: Netta Corrigan, PA-C Authorized by: Netta Corrigan, PA-C   Critical care provider statement:    Critical care time (minutes):  40   Critical care time was exclusive of:  Separately billable procedures and treating other patients   Critical care was necessary to treat or prevent imminent or life-threatening deterioration of the following conditions: ACS.   Critical care was time spent personally by me on the following activities:   Blood draw for specimens, development of treatment plan with patient or surrogate, discussions with consultants, evaluation of patient's response to treatment, examination of patient, obtaining history from patient or surrogate, review of old charts, re-evaluation of patient's condition, pulse oximetry, ordering and review of radiographic studies, ordering and review of laboratory studies and ordering and performing treatments and interventions   I assumed direction of critical care for this patient from another provider in my specialty: no     Care discussed with: admitting provider       Medications  Ordered in ED Medications  nitroGLYCERIN (NITROSTAT) SL tablet 0.4 mg (has no administration in time range)  donepezil (ARICEPT) tablet 10 mg (has no administration in time range)  rosuvastatin (CRESTOR) tablet 10 mg (has no administration in time range)  0.9 %  sodium chloride infusion (has no administration in time range)  acetaminophen (TYLENOL) tablet 650 mg (has no administration in time range)    Or  acetaminophen (TYLENOL) suppository 650 mg (has no administration in time range)  HYDROcodone-acetaminophen (NORCO/VICODIN) 5-325 MG per tablet 1-2 tablet (has no administration in time range)  ondansetron (ZOFRAN) tablet 4 mg (has no administration in time range)    Or  ondansetron (ZOFRAN) injection 4 mg (has no administration in time range)  alum & mag hydroxide-simeth (MAALOX/MYLANTA) 200-200-20 MG/5ML suspension 30 mL (30 mLs Oral Given 07/02/23 1738)  famotidine (PEPCID) tablet 20 mg (20 mg Oral Given 07/02/23 1738)  aspirin chewable tablet 324 mg (324 mg Oral Given 07/02/23 1856)    ED Course/ Medical Decision Making/ A&P             HEART Score: 5                    Medical Decision Making Amount and/or Complexity of Data Reviewed Labs: ordered. Radiology: ordered.  Risk OTC drugs. Prescription drug management. Decision regarding hospitalization.   Tomie China 83 y.o.  presented today for chest pain. Working DDx that I considered at this time includes, but not limited to, ACS, pulmonary embolism, community-acquired pneumonia, aortic dissection, pneumothorax, underlying bony abnormality, anemia, thyrotoxicosis, HTN urgency/emergency, esophageal rupture, CHF exacerbation, valvular disorder, myocarditis, pericarditis, endocarditis, pericardial effusion/cardiac tamponade, pulmonary edema, gastritis/PUD/GERD, esophagitis, MSK.  R/o Dx: pulmonary embolism, community-acquired pneumonia, aortic dissection, pneumothorax, underlying bony abnormality, anemia, thyrotoxicosis, HTN urgency/emergency, esophageal rupture, CHF exacerbation, valvular disorder, myocarditis, pericarditis, endocarditis, pericardial effusion/cardiac tamponade, pulmonary edema, gastritis/PUD/GERD, esophagitis, MSK: These are considered less likely due to history of present illness and physical exam findings. PE: Well's Score is 0 and so advanced imaging will not be ordered as alternative diagnoses are more likely at this time Aortic Dissection: less likely based on the location, quality, onset, and severity of symptoms in this case. Patient also has a lack of underlying history of AD or TAA.   Review of prior external notes: 06/30/2023 ambulatory summary  Unique Tests and My Interpretation:  EKG: Rate, rhythm, axis, intervals all examined and without medically relevant abnormality. ST segments without concerns for elevations Troponin: 3 CXR: No acute findings CBC: Unremarkable BMP: Unremarkable  Social Determinants of Health: none  Discussion with Independent Historian:  Daughter  Discussion of Management of Tests:  Dout[place], MD Hospitalist ; Lendell Caprice, MD  Risk: High: hospitalization or escalation of hospital-level care  Risk Stratification Score: HEART Score: 5  Staffed with Wallace Cullens, DO  Plan: On exam patient was in no acute distress with stable vitals. Patient's physical was remarkable for  feeling lightheaded for few moments after going from a lying to an upright position.  Patient states she has been lightheaded recently when going from a lying to standing position so we will get orthostatics as I do suspect she has orthostatic hypotension causing her symptoms. Labs and CXR will be ordered.  The cardiac monitor was ordered secondary to the patient's history of chest pain and to monitor the patient for dysrhythmia. The cardiac monitor revealed normal sinus as interpreted by me.  Patient daughter requested admission for echo and stress test however I explained to them that  we cannot admit unless there are lab abnormalities or cardiology feels the need to be admitted.  With her labs being reassuring the other day we can repeat these but they requested that I reach out to patient's cardiologist Dr. Lewayne Bunting to see if she can be seen sooner than 17[facility] in the outpatient setting.  Dr. Ladona Ridgel was message via epic.  Will obtain labs and give patient Protonix as patient does have history of acid reflux with esophagitis and so do question if this is causing her chest discomfort.  Will give Maalox as patient does have allergy to lidocaine.  Attending evaluated patient and believes patient will need to be admitted for unstable angina.  Will give aspirin and nitro and consult cardiology and hospitalist for admission as patient will need echo.  Hospitalist will admit patient.  I spoke to the cardiologist and they state the patient is at [place] [facility] and that the day team will see her to do an echo.  This chart was dictated using voice recognition software.  Despite best efforts to proofread,  errors can occur which can change the documentation meaning.        Final Clinical Impression(s) / ED Diagnoses Final diagnoses:  Unstable angina [facility])    Rx / DC Orders ED Discharge Orders     None         Remi Deter 07/02/23 2111    Edwin Dada P, DO 07/02/23 2357

## 2023-07-03 ENCOUNTER — Observation Stay (HOSPITAL_BASED_OUTPATIENT_CLINIC_OR_DEPARTMENT_OTHER)

## 2023-07-03 ENCOUNTER — Encounter: Admitting: Physical Therapy

## 2023-07-03 DIAGNOSIS — E782 Mixed hyperlipidemia: Secondary | ICD-10-CM | POA: Diagnosis not present

## 2023-07-03 DIAGNOSIS — I2510 Atherosclerotic heart disease of native coronary artery without angina pectoris: Secondary | ICD-10-CM

## 2023-07-03 DIAGNOSIS — R079 Chest pain, unspecified: Secondary | ICD-10-CM

## 2023-07-03 DIAGNOSIS — R0789 Other chest pain: Secondary | ICD-10-CM | POA: Diagnosis not present

## 2023-07-03 DIAGNOSIS — R42 Dizziness and giddiness: Secondary | ICD-10-CM | POA: Diagnosis not present

## 2023-07-03 DIAGNOSIS — R0609 Other forms of dyspnea: Secondary | ICD-10-CM | POA: Diagnosis not present

## 2023-07-03 DIAGNOSIS — I10 Essential (primary) hypertension: Secondary | ICD-10-CM | POA: Diagnosis not present

## 2023-07-03 LAB — MAGNESIUM: Magnesium: 2.1 mg/dL (ref 1.7–2.4)

## 2023-07-03 LAB — COMPREHENSIVE METABOLIC PANEL WITH GFR
ALT: 18 U/L (ref 0–44)
AST: 17 U/L (ref 15–41)
Albumin: 3.4 g/dL — ABNORMAL LOW (ref 3.5–5.0)
Alkaline Phosphatase: 50 U/L (ref 38–126)
Anion gap: 5 (ref 5–15)
BUN: 10 mg/dL (ref 8–23)
CO2: 25 mmol/L (ref 22–32)
Calcium: 9 mg/dL (ref 8.9–10.3)
Chloride: 106 mmol/L (ref 98–111)
Creatinine, Ser: 0.78 mg/dL (ref 0.44–1.00)
GFR, Estimated: >60 mL/min (ref >60)
Glucose, Bld: 99 mg/dL (ref 70–99)
Potassium: 3.7 mmol/L (ref 3.5–5.1)
Sodium: 136 mmol/L (ref 135–145)
Total Bilirubin: 1 mg/dL (ref 0.0–1.2)
Total Protein: 5.9 g/dL — ABNORMAL LOW (ref 6.5–8.1)

## 2023-07-03 LAB — CBC WITH DIFFERENTIAL/PLATELET
Abs Immature Granulocytes: 0.02 10*3/uL (ref 0.00–0.07)
Basophils Absolute: 0.1 10*3/uL (ref 0.0–0.1)
Basophils Relative: 1 %
Eosinophils Absolute: 0.1 10*3/uL (ref 0.0–0.5)
Eosinophils Relative: 2 %
HCT: 37.6 % (ref 36.0–46.0)
Hemoglobin: 12.6 g/dL (ref 12.0–15.0)
Immature Granulocytes: 0 %
Lymphocytes Relative: 50 %
Lymphs Abs: 2.7 10*3/uL (ref 0.7–4.0)
MCH: 33.8 pg (ref 26.0–34.0)
MCHC: 33.5 g/dL (ref 30.0–36.0)
MCV: 100.8 fL — ABNORMAL HIGH (ref 80.0–100.0)
Monocytes Absolute: 0.4 10*3/uL (ref 0.1–1.0)
Monocytes Relative: 8 %
Neutro Abs: 2.1 10*3/uL (ref 1.7–7.7)
Neutrophils Relative %: 39 %
Platelets: 223 10*3/uL (ref 150–400)
RBC: 3.73 MIL/uL — ABNORMAL LOW (ref 3.87–5.11)
RDW: 12.3 % (ref 11.5–15.5)
WBC: 5.5 10*3/uL (ref 4.0–10.5)
nRBC: 0 % (ref 0.0–0.2)

## 2023-07-03 LAB — ECHOCARDIOGRAM COMPLETE
AR max vel: 1.83 cm2
AV Area VTI: 1.63 cm2
AV Area mean vel: 1.58 cm2
AV Mean grad: 3 mmHg
AV Peak grad: 4.6 mmHg
Ao pk vel: 1.07 m/s
Area-P 1/2: 4.93 cm2
Calc EF: 60.9 %
Height: 65 in
MV VTI: 2.31 cm2
S' Lateral: 2.3 cm
Single Plane A2C EF: 61.7 %
Single Plane A4C EF: 67.2 %
Weight: 2112 [oz_av]

## 2023-07-03 LAB — OSMOLALITY, URINE: Osmolality, Ur: 431 mosm/kg (ref 300–900)

## 2023-07-03 LAB — OSMOLALITY: Osmolality: 285 mosm/kg (ref 275–295)

## 2023-07-03 LAB — TSH: TSH: 1.47 u[IU]/mL (ref 0.350–4.500)

## 2023-07-03 LAB — PHOSPHORUS: Phosphorus: 3.7 mg/dL (ref 2.5–4.6)

## 2023-07-03 MED ORDER — LORATADINE 10 MG PO TABS
10.0000 mg | ORAL_TABLET | Freq: Every day | ORAL | Status: DC
  Administered 2023-07-03 – 2023-07-04 (×2): 10 mg via ORAL
  Filled 2023-07-03 (×2): qty 1

## 2023-07-03 MED ORDER — FLUTICASONE PROPIONATE 50 MCG/ACT NA SUSP
2.0000 | Freq: Every day | NASAL | Status: DC
  Administered 2023-07-03 – 2023-07-04 (×2): 2 via NASAL
  Filled 2023-07-03: qty 16

## 2023-07-03 MED ORDER — MECLIZINE HCL 25 MG PO TABS
12.5000 mg | ORAL_TABLET | Freq: Three times a day (TID) | ORAL | Status: DC | PRN
  Administered 2023-07-04: 12.5 mg via ORAL
  Filled 2023-07-03: qty 1

## 2023-07-03 MED ORDER — MECLIZINE HCL 25 MG PO TABS
25.0000 mg | ORAL_TABLET | Freq: Two times a day (BID) | ORAL | Status: DC | PRN
  Administered 2023-07-03 (×2): 25 mg via ORAL
  Filled 2023-07-03 (×2): qty 1

## 2023-07-03 NOTE — Consult Note (Signed)
 Cardiology Consultation   Patient ID: [name] MRN: [id number]; DOB: 05/07/1940  Admit date: 07/02/2023 Date of Consult: 07/03/2023  PCP:  Melida Quitter, MD   [facility] Providers Cardiologist:  None        Patient Profile:   Kara Tapia is a 83 y.o. female with a history of syncope, hypertension, hyperlipidemia, vertigo, migraines, GERD, mild cognitive impairment, and tobacco abuse who is being seen 07/03/2023 for the evaluation of chest pain at the request of Evlyn Kanner (Emergency Department PA-C).  History of Present Illness:   Ms. Kara Tapia is a 83 year old female with the above history who has been seen by Dr. Ladona Ridgel in the past. She was referred to Dr. Ladona Ridgel in 10/2018 for further evaluation of syncope. Echo and outpatient monitor were ordered for further evaluation.  Echo showed LVEF of 60-65% with grade 2 diastolic dysfunction and no significant valvular disease.  Monitor normal sinus rhythm with sinus arrhythmia sinus bradycardia, and sinus tachycardia but no obvious PACs/PVCs and no significant arrhythmias.  Syncope was felt to possibly be due to labile BP with sudden drops in her BP.  She has not been seen by Cardiology since 07/2020.  Patient presented to the [facility] ED on 06/30/2023 for further evaluation of generalized weakness and feeling jittery. She also reported some worsening shortness of breath and lightheadedness but denied any chest pain at that time.  Was unremarkable.  EKG showed normal sinus rhythm with PACs.  High-sensitivity troponin was negative x2.  Chest x-ray showed no acute findings.  Labs are notable for slight hyponatremia with a sodium level of 134 but otherwise were normal.  Urinalysis showed small hemoglobin and rare bacteria but no leukocytes, WBCs, or nitrite.    She presented back to the [facility] ED on 07/02/2023 further evaluation of chest pain.  EKG showed normal sinus rhythm, rate 70 bpm, with no acute ischemic changes.   High-sensitivity troponin negative x2 (3 >> 4).  Chest x-ray showed no acute findings.  WBC 5.6, Hgb 13.1, Plts 229. Na 130, K 3.7, Cl 97, Glucose 123, Cr 0.83, BUN 10.  Total protein low at 6.4 but otherwise LFTs normal.  CK normal.  Magnesium 2.0. Patient was admitted and Cardiology consulted for further evaluation.  At the time of this evaluation evaluation, patient is resting comfortably in no acute distress.  Patient has some memory issues has some difficulty elaborating on details.  When asked what brought her into the hospital, she states she has been having headaches, fatigue, and lethargy for several months now.  However, when asked what brought her into the hospital yesterday, she states she started having "hot and cold sweats," nausea, and a severe headache.  She also reported point substernal chest pain that she states actually started while she was in the ED on 06/30/2023.  This pain occurs intermittently and last for couple of minutes at a time resolving on its own.  Nothing seems to make it better or worse.  It is not worse after meals or with exertion.  No pleuritic or positional pain.  It is reproducible with palpation.  She also reports dyspnea on exertion.  She states she has had this for years but does feel like it is getting worse.  She denies any chest pain when walking around her house but states if she will walk to her mailbox or longer distances she will get out of breath and have to stop.  No dyspnea at rest.  No orthopnea, PND, or edema.  She has frequent palpitations that last for about 5 minutes at a time.  She also has dizziness due to vertigo but this is improved with vestibular rehab.  No recent syncopal episodes she does states she felt like she was about to pass out in the ED yesterday. She also describes fatigue and generalized weakness but this does not seem to be new.  In fact, she was having the symptoms back when she first saw Dr. Ladona Ridgel in 2020. She reports recent URI symptoms  but could not tell me when this was.  I asked about GI symptoms, but she was unable to give me a clear answer on this.  No abnormal bleeding in urine or stools.    She denies any chest pain at this time unless I palpate the exact area where she was having pain yesterday.  She does still have a mild headache and some lightheadedness.  She does report that she has had chest pain before but states it felt different. Prior chest pain was more diffuse. However, she could not remember when the last time she had this pain was.  He has a long history of tobacco use and states she is smoked since she was in her late 73s to early 21s.  She currently smokes 1/2 pack per day.  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 Authorizing Provider  albuterol (VENTOLIN HFA) 108 (90 Base) MCG/ACT inhaler Inhale 2  puffs into the lungs every 4 (four) hours as needed for wheezing or shortness of breath. 05/08/23  Yes [provider]  amLODipine (NORVASC) 2.5 MG tablet Take 2.5 mg by mouth daily. 09/24/21  Yes [provider]  CALCIUM PO Take 1 tablet by mouth daily after breakfast.   Yes [provider]  Cholecalciferol (VITAMIN D3 PO) Take 1 capsule by mouth daily after breakfast.   Yes [provider]  clonazePAM (KLONOPIN) 0.5 MG tablet Take 0.5 tablets (0.25 mg total) by mouth at bedtime. Patient taking differently: Take 0.25 mg by mouth at bedtime as needed (for sleep). 04/10/23  Yes Lomax, Amy, NP  Cyanocobalamin (VITAMIN B12 PO) Take 1 tablet by mouth daily after breakfast.   Yes [provider]  donepezil (ARICEPT) 10 MG tablet Take 1 tablet (10 mg total) by mouth daily. 04/10/23  Yes Lomax, Amy, NP  MAGNESIUM PO Take 1 tablet by mouth daily after breakfast.   Yes [provider]  rosuvastatin (CRESTOR) 10 MG tablet Take 10 mg by mouth daily. 12/29/22  Yes [provider]  SUMAtriptan (IMITREX) 25 MG tablet Take 1 tablet (25 mg total) by mouth every 2 (two) hours as needed for migraine. May repeat in 2 hours if headache persists or recurs. 05/29/23  Yes Dohmeier, Porfirio Mylar, MD  TYLENOL 500 MG tablet Take 500 mg by mouth every 6 (six) hours as needed (for headaches).   Yes [provider]    Inpatient Medications: Scheduled Meds:  donepezil  10 mg Oral Daily   rosuvastatin  10 mg Oral Daily   Continuous Infusions:  PRN Meds: acetaminophen **OR** acetaminophen, HYDROcodone-acetaminophen, nitroGLYCERIN, ondansetron **OR** ondansetron (ZOFRAN) IV  Allergies:    Allergies  Allergen Reactions   Lidocaine Rash and Other (See Comments)    Affected the blood pressure- almost passed out also   Novocain [Procaine] Other (See Comments)    An injection at the dentist's caused salivation for 90+ days afterwards.    Social History:   Social  History   Socioeconomic History   Marital status: Divorced    Spouse name: Not on file   Number of children: 3   Years of education: 16   Highest education level: Not on file  Occupational History   Occupation: retired  Tobacco Use   Smoking status: Every Day    Current packs/day: 0.50    Types: Cigarettes    Passive exposure: Never   Smokeless tobacco: Never  Vaping Use   Vaping status: Never Used  Substance and Sexual Activity   Alcohol use: Yes    Alcohol/week: 5.0 standard drinks of alcohol    Types: 5 Glasses of wine per week    Comment: 1 glass of wine nightly   Drug use: No   Sexual activity: Not on file  Other Topics Concern   Not on file  Social History Narrative   Drinks 3-4 cups of caffeine daily.   Social Drivers of Corporate investment banker Strain: Not on file  Food Insecurity: No Food Insecurity (07/03/2023)   Hunger Vital Sign    Worried About Running Out of Food in the Last Year: Never true    Ran Out of Food in the Last Year: Never true  Transportation Needs: No Transportation Needs (07/03/2023)   PRAPARE - Administrator, Civil Service (Medical): No    Lack of Transportation (Non-Medical): No  Physical Activity: Not on file  Stress: Not on file  Social Connections: Moderately Integrated (07/03/2023)   Social Connection and Isolation Panel [NHANES]    Frequency of Communication with Friends and Family: Twice a week    Frequency of Social Gatherings with Friends and Family: Once a week    Attends Religious Services: 1 to 4 times per year    Active Member of [employer] or Organizations: Yes    Attends Banker Meetings: 1 to 4 times per year    Marital Status: Divorced  Intimate Partner Violence: Not At Risk (07/03/2023)   Humiliation, Afraid, Rape, and Kick questionnaire    Fear of Current or Ex-Partner: No    Emotionally Abused: No    Physically Abused: No    Sexually Abused: No    Family History:   Family History  Problem  Relation Age of Onset   Diabetes Mother    Heart disease Mother  Cancer Mother        type unknown   Bone cancer Father    Diabetes Sister    Lung disease Sister    COPD Sister    Heart attack Paternal Grandfather    Down syndrome Daughter    Dementia Daughter    Other Daughter        colon surgery     ROS:  Please see the history of present illness.   Physical Exam/Data:   Vitals:   07/02/23 2045 07/02/23 2200 07/02/23 2243 07/03/23 0350  BP: (!) 149/92 126/74 117/70 118/73  Pulse: 61 70 65 61  Resp: (!) 24 19 18 17   Temp:  98.1 F (36.7 C) 97.6 F (36.4 C) 98.1 F (36.7 C)  TempSrc:  Oral Oral   SpO2: 97% 96% 97% 98%  Weight:      Height:       No intake or output data in the 24 hours ending 07/03/23 0722    07/02/2023    1:09 PM 06/30/2023    9:53 AM 05/21/2023    8:27 AM  Last 3 Weights  Weight (lbs) 132 lb 132 lb 128 lb 6.4 oz  Weight (kg) 59.875 kg 59.875 kg 58.242 kg     Body mass index is 21.97 kg/m.  General: 83 y.o. thin Caucasian female resting comfortably in no acute distress. HEENT: Normocephalic and atraumatic. Sclera clear.  Neck: Supple. No carotid bruits. No JVD. Heart: RRR. No murmurs, gallops, or rubs. Radial and distal pedal pulses 2+ and equal bilaterally. Lungs: No increased work of breathing. Clear to ausculation bilaterally. No wheezes, rhonchi, or rales.  Abdomen: Soft, non-distended, and non-tender to palpation.  MSK: Normal strength and tone for age. Extremities: No lower extremity edema.    Skin: Warm and dry. Neuro: No focal deficits. Psych: Normal affect. Responds appropriately.   EKG:  The EKG was personally reviewed and demonstrates:  Normal sinus rhythm, rate 70 bpm, with no acute ischemic changes.  Telemetry:  Telemetry was personally reviewed and demonstrates:  Sinus arrhythmia vs PACs. Rates in the 50s to 60s at rest and up to the 80s with activity.  Relevant CV Studies:  Echocardiogram 11/24/2018: Impressions: 1. The  left ventricle has normal systolic function with an ejection  fraction of 60-65%. The cavity size was normal. Left ventricular diastolic  Doppler parameters are consistent with pseudonormalization.   2. The right ventricle has normal systolic function. The cavity was  normal. There is no increase in right ventricular wall thickness.   3. No evidence of mitral valve stenosis.   4. No stenosis of the aortic valve.   5. The aorta is abnormal unless otherwise noted.   6. There is mild dilatation of the ascending aorta.   7. The atrial septum is grossly normal.  _______________  Monitor 12/2018: 1. NSR with sinus arrhythmia, sinus brady and sinus tachycardia 2. No VT or SVT and no obvious PAC's or PVC's 3. No prolonged pauses  Laboratory Data:  High Sensitivity Troponin:   Recent Labs  Lab 06/30/23 1008 06/30/23 1147 07/02/23 1340 07/02/23 1925  TROPONINIHS 2 5 3 4      Chemistry Recent Labs  Lab 06/30/23 0955 07/02/23 1340 07/02/23 1925 07/03/23 0411  NA 134* 130*  --  136  K 4.3 3.7  --  3.7  CL 99 97*  --  106  CO2 27 26  --  25  GLUCOSE 86 123*  --  99  BUN 11 10  --  10  CREATININE 0.86 0.83  --  0.78  CALCIUM 9.4 9.2  --  9.0  MG  --   --  2.0 2.1  GFRNONAA >60 >60  --  >60  ANIONGAP 8 7  --  5    Recent Labs  Lab 07/02/23 1925 07/03/23 0411  PROT 6.4* 5.9*  ALBUMIN 3.8 3.4*  AST 22 17  ALT 20 18  ALKPHOS 55 50  BILITOT 0.7 1.0   Lipids No results for input(s): "CHOL", "TRIG", "HDL", "LABVLDL", "LDLCALC", "CHOLHDL" in the last 168 hours.  Hematology Recent Labs  Lab 06/30/23 0955 07/02/23 1340 07/03/23 0411  WBC 4.7 5.6 5.5  RBC 4.35 3.98 3.73*  HGB 14.5 13.1 12.6  HCT 43.1 39.6 37.6  MCV 99.1 99.5 100.8*  MCH 33.3 32.9 33.8  MCHC 33.6 33.1 33.5  RDW 12.4 12.2 12.3  PLT 247 229 223   Thyroid  Recent Labs  Lab 07/03/23 0411  TSH 1.470    BNPNo results for input(s): "BNP", "PROBNP" in the last 168 hours.  DDimer No results for input(s):  "DDIMER" in the last 168 hours.   Radiology/Studies:  DG Chest 2 View Result Date: 07/02/2023 CLINICAL DATA:  Chest pressure EXAM: CHEST - 2 VIEW COMPARISON:  None Available. FINDINGS: Heart size is unchanged. No focal consolidation, pleural effusion, or pneumothorax. IMPRESSION: No active cardiopulmonary disease. Electronically Signed   By: Lowell Guitar M.D.   On: 07/02/2023 14:41   DG Chest 2 View Result Date: 06/30/2023 CLINICAL DATA:  Shortness of breath and feeling lightheaded EXAM: CHEST - 2 VIEW COMPARISON:  Chest radiograph dated 10/05/2018 FINDINGS: Normal lung volumes. No focal consolidations. No pleural effusion or pneumothorax. Enlarged cardiomediastinal silhouette is likely projectional. No acute osseous abnormality. IMPRESSION: No active cardiopulmonary disease. Electronically Signed   By: Agustin Cree M.D.   On: 06/30/2023 11:07     Assessment and Plan:   Atypical Chest Pain Dyspnea on Exertion Patient presented with multiple complaints including intermittent chest pain for the last 2-3 days.  She also reports she has had dyspnea on exertion for years but states it is getting worse. No dyspnea at rest. EKG shows normal sinus rhythm no acute ischemic changes.  High-sensitivity troponin negative x 2. Chest x-ray showed no acute findings.  - She has pinpoint chest pain that is reproducible with palpation. She does states that she has had more diffuse chest pain in the past but cannot tell me when the last time she had this was.  - She is euvolemic on exam. No other signs or symptoms of CHF. - Will order an Echo. - Current chest pain does not sound cardiac in nature and sounds like musculoskeletal chest pain. She does states that she has had more diffuse chest pain in the past but cannot tell me when the last time she had this was. Dyspnea could be an anginal equivalent. However, could also be due to underlying COPD and continued tobacco use. It does not look like she has ever had an  ischemic evaluation. However, chest CT in 2014 did show some coronary artery calcifications. Will start with an Echo as above. If Echo shows normal LV function and wall motion, could consider outpatient ischemic evaluation with cardiac PET stress test vs coronary CTA.  Palpitations She does report frequent palpitations that will last for 5 minutes at a time. Prior monitor in 2020 showed no significant arrhythmias.  - Telemetry shows sinus arrhythmia vs PACs but no tachyarrhythmias.  - TSH normal.  -  Continue to monitor on telemetry while inpatient. Consider repeat outpatient monitor.   Hypertension Has a history of hypertension with labile BP. Systolic BP has ranged from 104 to 160 since being here. - She was previously on Amlodipine but PCP recently stopped this because her BP was dropping significantly with this. - Consider renal artery dopplers to assess for renal artery stenosis. - Per H&P, a family member who is an Internal Medicine 1st year resident was concerned that she may have pheochromocytoma. This is rare. However, she does report intermittent palpitations, headaches, and diaphoresis. Will discuss screening for this with MD.   Hyperlipidemia - Continue Crestor 10mg  daily.  - Will check fasting lipid panel tomorrow.  History of Syncope She has a history of syncope for which she was evaluated by Dr. Ladona Ridgel several years ago but denies any recent episodes of this.   Otherwise, per primary team: - Hyponatremia - COPD  - Headache/ migraines  - Vertigo - Mild cognitive impairment  Risk Assessment/Risk Scores:   For questions or updates, please contact [facility] Please consult [email or web address] for contact info under    Signed, Corrin Parker, PA-C  07/03/2023 7:22 AM

## 2023-07-03 NOTE — Progress Notes (Signed)
 Transition of Care [facility]) - Inpatient Brief Assessment  Patient Details  Name: Kara Tapia MRN: [id number] Date of Birth: 02-04-1941  [facility] CM/SW Contact:    Ewing Schlein, LCSW Phone Number: 07/03/2023, 2:30 PM  Clinical Narrative: PT evaluation recommended OPPT. Patient confirmed she already goes to [facility] and needs to reschedule her appointment that was scheduled for tomorrow. No TOC needs at this time.   07/03/23 1413  TOC Brief Assessment  Insurance and Status Reviewed  Patient has primary care physician Yes  Home environment has been reviewed Resides alone in single family home  Prior level of function: Independent with ADLs at baseline  Prior/Current Home Services No current home services  Social Drivers of Health Review SDOH reviewed no interventions necessary  Readmission risk has been reviewed Yes  Transition of care needs no transition of care needs at this time

## 2023-07-03 NOTE — Progress Notes (Signed)
  Echocardiogram 2D Echocardiogram has been performed.  Ocie Doyne RDCS 07/03/2023, 2:54 PM

## 2023-07-03 NOTE — Care Management Obs Status (Signed)
 MEDICARE OBSERVATION STATUS NOTIFICATION   Patient Details  Name: Kara Tapia MRN: [id number] Date of Birth: 15-May-1940   Medicare Observation Status Notification Given:  Yes    Ewing Schlein, LCSW 07/03/2023, 3:53 PM

## 2023-07-03 NOTE — Progress Notes (Signed)
 PROGRESS NOTE Kara Tapia  ZOX:[id number] DOB: 12-03-1940 DOA: 07/02/2023 PCP: Melida Quitter, MD  Brief Narrative/Hospital Course: 83 y.o. female with medical history significant of  GERD, dementia, Vertigo, hypertension presented to the ED for evaluation of chest pressure, occurs at rest, not relieved by anything. Her PCP was worried about over controlling her BP because it drops down rapidly with her level blood pressure. Patient was seen initially on 4/7 in the ED with close shortness of breath, had a workup with negative troponin stable labs  cxr- normal.  Seen in the ED again-troponin negative.  Labs okay, cardiology consulted and admitted for further management Echocardiogram obtained, serial troponins negative, did well with PT OT and no further PT recommendation  Subjective: Seen examined this morning no chest pain resting well Overnight afebrile BP stable Labs reviewed stable CBC BMP TSH 1.4  Assessment and Plan:  Chest pain: Question musculoskeletal, cardiology has been consulted, await further recommendation. Echocardiogram obtained, serial troponin negative.   Amnestic MCI continue Aricept. Monitor for any sign of sundowning    COPD mixed type: Chronic stable  HLD: Continue statins..  Labile hypertension: her PCP has taken her off of Norvasc because blood pressure was dropping she restarted in the past few days because family was concerned because blood pressure was going up into 100-150s mostly.Family member who is internal medicine first year resident's is suggested for family to have patient tested for pheochromocytoma-on admission admitting discussed with family at length if they are interested in pursuing this to have this done as an outpatient setting  Other differential diagnosis for labile blood pressure would be dysautonomia. In elderly avoiding hypotension would be paramount to avoid falls and injury.  Holding antihypertensives.  On review her blood pressure has  been running from 100-150s. 24hr urine C, metanephrine has been ordered per cardio.   Vertigo: She did well with PT OT this morning. Cont to follow-up outpatient vestibular rehab, added meclizine PRN   Mixed hyperlipidemia Continue Crestor 10 mg a day   Hyponatremia Resolved.  Monitor .  Ensure adequate oral intake     DVT prophylaxis: SCDs Start: 07/02/23 2057 Code Status:   Code Status: Full Code Family Communication: plan of care discussed with patient/daughter at bedside. Patient status is: Remains hospitalized because of severity of illness Level of care: Progressive   Dispo: The patient is from: home            Anticipated disposition: likely in 24 hrs  Objective: Vitals last 24 hrs: Vitals:   07/02/23 2243 07/03/23 0350 07/03/23 0739 07/03/23 1157  BP: 117/70 118/73 (!) 148/77 (!) 145/84  Pulse: 65 61 (!) 55 63  Resp: 18 17 16 16   Temp: 97.6 F (36.4 C) 98.1 F (36.7 C) 98.2 F (36.8 C) 98.7 F (37.1 C)  TempSrc: Oral  Oral Oral  SpO2: 97% 98% 98% 97%  Weight:      Height:       Weight change:   Physical Examination: General exam: alert awake, older than stated age HEENT:Oral mucosa moist, Ear/Nose WNL grossly Respiratory system: Bilaterally diminished BS, no use of accessory muscle Cardiovascular system: S1 & S2 +. Gastrointestinal system: Abdomen soft, NT,ND,BS+ Nervous System: Alert, awake,following commands. Extremities: LE edema neg, moving arms leg, warm legs Skin: No rashes,warm. MSK: Normal muscle bulk/tone.   Medications reviewed:  Scheduled Meds:  donepezil  10 mg Oral Daily   fluticasone  2 spray Each Nare Daily   loratadine  10 mg Oral Daily  rosuvastatin  10 mg Oral Daily  Continuous Infusions:  Diet Order             Diet regular Room service appropriate? Yes; Fluid consistency: Thin  Diet effective now                  Intake/Output Summary (Last 24 hours) at 07/03/2023 1407 Last data filed at 07/03/2023 1029 Gross per 24  hour  Intake 220 ml  Output --  Net 220 ml   Net IO Since Admission: 220 mL [07/03/23 1407]  Wt Readings from Last 3 Encounters:  07/02/23 59.9 kg  06/30/23 59.9 kg  05/21/23 58.2 kg    Unresulted Labs (From admission, onward)     Start     Ordered   07/04/23 0500  Lipid panel  Tomorrow morning,   R       Question:  Specimen collection method  Answer:  Lab=Lab collect   07/03/23 0848   07/03/23 1246  Catecholamines,Ur.,Free,24 Hh  Once,   R        07/03/23 1246   07/03/23 1246  Metanephrines, urine, 24 hour  Once,   R        07/03/23 1246   07/03/23 0500  Lipoprotein A (LPA)  Tomorrow morning,   R        07/02/23 2125   07/02/23 1945  CBC with Differential/Platelet  Once,   R        07/02/23 1945   07/02/23 1914  CBC with Differential/Platelet  Add-on,   AD       Question:  Release to patient  Answer:  Immediate   07/02/23 1913          Data Reviewed: I have personally reviewed following labs and imaging studies ( see epic result tab) CBC: Recent Labs  Lab 06/30/23 0955 07/02/23 1340 07/03/23 0411  WBC 4.7 5.6 5.5  NEUTROABS  --   --  2.1  HGB 14.5 13.1 12.6  HCT 43.1 39.6 37.6  MCV 99.1 99.5 100.8*  PLT 247 229 223   CMP: Recent Labs  Lab 06/30/23 0955 07/02/23 1340 07/02/23 1925 07/03/23 0411  NA 134* 130*  --  136  K 4.3 3.7  --  3.7  CL 99 97*  --  106  CO2 27 26  --  25  GLUCOSE 86 123*  --  99  BUN 11 10  --  10  CREATININE 0.86 0.83  --  0.78  CALCIUM 9.4 9.2  --  9.0  MG  --   --  2.0 2.1  PHOS  --   --  3.2 3.7   GFR: Estimated Creatinine Clearance: 49.6 mL/min (by C-G formula based on SCr of 0.78 mg/dL). Recent Labs  Lab 07/02/23 1925 07/03/23 0411  AST 22 17  ALT 20 18  ALKPHOS 55 50  BILITOT 0.7 1.0  PROT 6.4* 5.9*  ALBUMIN 3.8 3.4*   Recent Labs  Lab 06/30/23 1011  GLUCAP 112*  No results for input(s): "CHOL", "HDL", "LDLCALC", "TRIG", "CHOLHDL", "LDLDIRECT" in the last 72 hours. Recent Labs    07/03/23 0411  TSH 1.470   Antimicrobials/Microbiology: Anti-infectives (From admission, onward)    None      No results found for: "SDES", "SPECREQUEST", "CULT", "REPTSTATUS"  Radiology Studies: DG Chest 2 View Result Date: 07/02/2023 CLINICAL DATA:  Chest pressure EXAM: CHEST - 2 VIEW COMPARISON:  None Available. FINDINGS: Heart size is unchanged. No focal consolidation, pleural effusion, or pneumothorax.  IMPRESSION: No active cardiopulmonary disease. Electronically Signed   By: Lowell Guitar M.D.   On: 07/02/2023 14:41    LOS: 0 days   Total time spent in review of labs and imaging, patient evaluation, formulation of plan, documentation and communication with patient/family: 35 minutes  Lanae Boast, MD [facility] 07/03/2023, 2:07 PM

## 2023-07-03 NOTE — Progress Notes (Signed)
 Pt O2 stayed at 97-98% while ambulating in hallway

## 2023-07-03 NOTE — Hospital Course (Addendum)
 83 y.o. female with medical history significant of  GERD, dementia, Vertigo, hypertension presented to the ED for evaluation of chest pressure, occurs at rest, not relieved by anything. Her PCP was worried about over controlling her BP because it drops down rapidly with her level blood pressure. Patient was seen initially on 4/7 in the ED with close shortness of breath, had a workup with negative troponin stable labs  cxr- normal.  Seen in the ED again-troponin negative.  Labs okay, cardiology consulted and admitted for further management Echocardiogram obtained, serial troponins negative, did well with PT OT and no further PT recommendation Echo showed EF 60 to 65% no RWMA RV size normal and normal function no other acute finding 24-hour urine sample ordered. Cardiology plan for outpatient follow-up and close monitoring and CTA to further evaluate. Cardio has cleared for discharge home today  Subjective: Seen and examined this morning Some dizziness No new complaints. Overnight afebrile BP holding in 114-140s, on room air   Discharge diagnoses:  Chest pain Coronary atherosclerosis Hyperlipidemia: Atypical chest pain serial troponin negative, TTE with normal EF, no wall motion abnormalities,LDL is stable at 47.Seen and cleared by cardiology  Aspirin added, continue Crestor, plan for outpatient CT coronaries   Amnestic MCI continue Aricept. Monitor for any sign of sundowning    COPD mixed type: Chronic stable  Labile hypertension: her PCP has taken her off of Norvasc because blood pressure was dropping she restarted in the past few days because family was concerned because blood pressure was going up into 100-150s mostly.Family member who is internal medicine first year resident's is suggested for family to have patient tested for pheochromocytoma-on admission admitting discussed with family at length if they are interested in pursuing this to have this done as an outpatient setting   Other differential diagnosis for labile blood pressure would be dysautonomia. In elderly avoiding hypotension would be paramount to avoid falls and injury.  Holding antihypertensives.  On review her blood pressure has been running from 100-150s. 24hr urine CA, metanephrine has been ordered by  cardio for completeness  will be completed this afternoon-patient will need to follow-up with cardiology as outpatient for the results. added pr nhydralazine po for home  Vertigo: She did well with PT OT .Following follow-up outpatient vestibular rehab, added meclizine as needed Will do referral for vestibular PT.  Advised compression stocking and adequate hydration with 2 to 3 L of fluid daily   Mixed hyperlipidemia Continue Crestor 10 mg a day   Hyponatremia Resolved.  Monitor .  Ensure adequate oral intake

## 2023-07-03 NOTE — Evaluation (Signed)
 Physical Therapy Evaluation Patient Details Name: Kara Tapia MRN: [id number] DOB: February 11, 1941 Today's Date: 07/03/2023  History of Present Illness  83 y.o. female admitted with chest pain, suspected to be musculoskeletal. Pt with medical history significant of  GERD, dementia, Vertigo, hypertension.  Clinical Impression  Pt is mobilizing well, she ambulated 350' without an assistive device without loss of balance. She did report some vertigo while walking but did not stagger, did not lose balance. Pt reports she's been going to outpatient vestibular PT, and that it's been helpful, her next appointment is tomorrow. HR walking 90, SpO2 98% on room air. She is ready to DC from a PT standpoint, no further PT indicated.         If plan is discharge home, recommend the following:     Can travel by private vehicle        Equipment Recommendations None recommended by PT  Recommendations for Other Services       Functional Status Assessment Patient has not had a recent decline in their functional status     Precautions / Restrictions Precautions Precautions: Fall Recall of Precautions/Restrictions: Intact Precaution/Restrictions Comments: denies falls in past 6 months, but has had severe vertigo for which she's been going to outpatient vestibular PT (next appointment is tomorrow) Restrictions Weight Bearing Restrictions Per Provider Order: No      Mobility  Bed Mobility Overal bed mobility: Modified Independent             General bed mobility comments: use of bedrail    Transfers Overall transfer level: Independent                      Ambulation/Gait Ambulation/Gait assistance: Independent Gait Distance (Feet): 350 Feet Assistive device: None Gait Pattern/deviations: Decreased stride length, Step-through pattern Gait velocity: WFL     General Gait Details: steady, pt did report some vertigo while walking but did not lose her balance, VCs to focus on an  object straight ahead, HR 90 walking, SpO2 98% on room air walking  Stairs            Wheelchair Mobility     Tilt Bed    Modified Rankin (Stroke Patients Only)       Balance Overall balance assessment: Mild deficits observed, not formally tested                                           Pertinent Vitals/Pain Pain Assessment Pain Assessment: No/denies pain    Home Living Family/patient expects to be discharged to:: Private residence Living Arrangements: Alone Available Help at Discharge: Family;Available PRN/intermittently               Additional Comments: has hiking sticks, doesn't use them    Prior Function Prior Level of Function : Driving;Independent/Modified Independent             Mobility Comments: walks without AD, when experiencing vertigo she sits down, denies falls in past 6 months, can drive but doesn't if she's dizzy ADLs Comments: independent     Extremity/Trunk Assessment   Upper Extremity Assessment Upper Extremity Assessment: Overall WFL for tasks assessed    Lower Extremity Assessment Lower Extremity Assessment: Overall WFL for tasks assessed    Cervical / Trunk Assessment Cervical / Trunk Assessment: Normal  Communication   Communication Communication: No apparent difficulties    Cognition  Arousal: Alert Behavior During Therapy: WFL for tasks assessed/performed   PT - Cognitive impairments: No apparent impairments                       PT - Cognition Comments: h/o dementia per chart Following commands: Intact       Cueing       General Comments      Exercises     Assessment/Plan    PT Assessment All further PT needs can be met in the next venue of care  PT Problem List Decreased balance       PT Treatment Interventions      PT Goals (Current goals can be found in the Care Plan section)  Acute Rehab PT Goals PT Goal Formulation: All assessment and education complete, DC  therapy    Frequency       Co-evaluation               AM-PAC PT "6 Clicks" Mobility  Outcome Measure Help needed turning from your back to your side while in a flat bed without using bedrails?: None Help needed moving from lying on your back to sitting on the side of a flat bed without using bedrails?: None Help needed moving to and from a bed to a chair (including a wheelchair)?: None Help needed standing up from a chair using your arms (e.g., wheelchair or bedside chair)?: None Help needed to walk in hospital room?: None Help needed climbing 3-5 steps with a railing? : None 6 Click Score: 24    End of Session Equipment Utilized During Treatment: Gait belt Activity Tolerance: Patient tolerated treatment well Patient left: in bed;with bed alarm set;with call bell/phone within reach Nurse Communication: Mobility status PT Visit Diagnosis: Dizziness and giddiness (R42)    Time: 0865-7846 PT Time Calculation (min) (ACUTE ONLY): 14 min   Charges:   PT Evaluation $PT Eval Low Complexity: 1 Low   PT General Charges $$ ACUTE PT VISIT: 1 Visit        Tamala Ser PT 07/03/2023  Acute Rehabilitation Services  Office [phone]

## 2023-07-03 NOTE — Evaluation (Signed)
 Occupational Therapy Evaluation Patient Details Name: Kara Tapia MRN: [id number] DOB: 1941-01-20 Today's Date: 07/03/2023   History of Present Illness   83 yr old female admitted with chest pain, dizziness, and shortness of breath. Pt with medical history significant of  GERD, dementia, vertigo, hypertension.     Clinical Impressions The pt performed all assessed tasks without the need for physical assistance, including lower body dressing, supine to sit, sit to stand, ambulation into the hallway. She reported experiencing sporadic episodes of dizziness, wooziness, and shortness of breath over the past ~3 months. OT subsequently educated her on general safety techniques  and simple tasks modifications to implement as needed during self-care tasks; she verbalized understanding. No further OT services are warranted, therefore OT will sign off. OT will sign off and recommend she resume outpatient vestibular rehab.      If plan is discharge home, recommend the following:   Assist for transportation     Functional Status Assessment   Patient has not had a recent decline in their functional status     Equipment Recommendations   None recommended by OT     Recommendations for Other Services         Precautions/Restrictions   Precautions Precaution/Restrictions Comments: denies falls in past 6 months, but has had severe vertigo for which she's been going to outpatient vestibular PT (next appointment is tomorrow) Restrictions Weight Bearing Restrictions Per Provider Order: No     Mobility Bed Mobility Overal bed mobility: Modified Independent             General bed mobility comments: use of bedrail    Transfers Overall transfer level: Independent          Balance Overall balance assessment: Mild deficits observed, not formally tested         ADL either performed or assessed with clinical judgement   ADL Overall ADL's : Modified  independent;Independent;At baseline         Vision   Additional Comments: She correctly read the time depicted on the wall clock.            Pertinent Vitals/Pain Pain Assessment Pain Assessment: No/denies pain     Extremity/Trunk Assessment Upper Extremity Assessment Upper Extremity Assessment: Overall WFL for tasks assessed;Right hand dominant   Lower Extremity Assessment Lower Extremity Assessment: Overall WFL for tasks assessed     Communication Communication Communication: No apparent difficulties   Cognition Arousal: Alert Behavior During Therapy: WFL for tasks assessed/performed               OT - Cognition Comments: Oriented x4                 Following commands: Intact                  Home Living Family/patient expects to be discharged to:: Private residence Living Arrangements: Alone Available Help at Discharge: Family Type of Home: House       Home Layout: Multi-level;Able to live on main level with bedroom/bathroom Alternate Level Stairs-Number of Steps: basement, main, and upper levels   Bathroom Shower/Tub: Walk-in shower         Home Equipment: Shower seat - built in   Additional Comments: has hiking sticks, doesn't use them      Prior Functioning/Environment Prior Level of Function : Driving;Independent/Modified Independent             Mobility Comments: walks without AD, when experiencing vertigo she sits down, denies falls  in past 6 months, can drive but doesn't if she's dizzy ADLs Comments: independent              OT Frequency:   N/A       AM-PAC OT "6 Clicks" Daily Activity     Outcome Measure Help from another person eating meals?: None Help from another person taking care of personal grooming?: None Help from another person toileting, which includes using toliet, bedpan, or urinal?: None Help from another person bathing (including washing, rinsing, drying)?: None Help from another person to  put on and taking off regular upper body clothing?: None Help from another person to put on and taking off regular lower body clothing?: None 6 Click Score: 24   End of Session Equipment Utilized During Treatment: Other (comment) (N/A) Nurse Communication: Other (comment) (blood pressure readings)  Activity Tolerance: Patient tolerated treatment well Patient left: in bed;with call bell/phone within reach  OT Visit Diagnosis: Dizziness and giddiness (R42)                Time: 1610-9604 OT Time Calculation (min): 23 min Charges:  OT General Charges $OT Visit: 1 Visit OT Evaluation $OT Eval Low Complexity: 1 Low    Bellamie Turney L [place] Wadas, OTR/L 07/03/2023, 2:08 PM

## 2023-07-04 ENCOUNTER — Other Ambulatory Visit (HOSPITAL_COMMUNITY): Payer: Self-pay

## 2023-07-04 ENCOUNTER — Ambulatory Visit: Payer: Self-pay | Admitting: Physical Therapy

## 2023-07-04 ENCOUNTER — Other Ambulatory Visit: Payer: Self-pay | Admitting: Student

## 2023-07-04 DIAGNOSIS — R079 Chest pain, unspecified: Secondary | ICD-10-CM | POA: Diagnosis not present

## 2023-07-04 DIAGNOSIS — I2510 Atherosclerotic heart disease of native coronary artery without angina pectoris: Secondary | ICD-10-CM | POA: Diagnosis not present

## 2023-07-04 DIAGNOSIS — E782 Mixed hyperlipidemia: Secondary | ICD-10-CM | POA: Diagnosis not present

## 2023-07-04 DIAGNOSIS — R0789 Other chest pain: Secondary | ICD-10-CM | POA: Diagnosis not present

## 2023-07-04 DIAGNOSIS — R0609 Other forms of dyspnea: Secondary | ICD-10-CM | POA: Diagnosis not present

## 2023-07-04 DIAGNOSIS — R072 Precordial pain: Secondary | ICD-10-CM

## 2023-07-04 DIAGNOSIS — R42 Dizziness and giddiness: Secondary | ICD-10-CM | POA: Diagnosis not present

## 2023-07-04 LAB — LIPID PANEL
Cholesterol: 128 mg/dL (ref 0–200)
HDL: 68 mg/dL (ref >40)
LDL Cholesterol: 47 mg/dL (ref 0–99)
Total CHOL/HDL Ratio: 1.9 ratio
Triglycerides: 64 mg/dL (ref <150)
VLDL: 13 mg/dL (ref 0–40)

## 2023-07-04 LAB — LIPOPROTEIN A (LPA): Lipoprotein (a): <8.4 nmol/L (ref <75.0)

## 2023-07-04 MED ORDER — METOPROLOL TARTRATE 25 MG PO TABS
25.0000 mg | ORAL_TABLET | Freq: Once | ORAL | 0 refills | Status: DC
Start: 2023-07-04 — End: 2023-11-26

## 2023-07-04 MED ORDER — HYDRALAZINE HCL 25 MG PO TABS: 25.0000 mg | ORAL_TABLET | Freq: Two times a day (BID) | ORAL | 0 refills | Status: DC | PRN

## 2023-07-04 MED ORDER — MECLIZINE HCL 25 MG PO TABS
12.5000 mg | ORAL_TABLET | Freq: Three times a day (TID) | ORAL | 0 refills | Status: DC | PRN
  Filled 2023-07-04: qty 15, 10d supply, fill #0

## 2023-07-04 MED ORDER — ASPIRIN 81 MG PO TBEC
81.0000 mg | DELAYED_RELEASE_TABLET | Freq: Every day | ORAL | 0 refills | Status: AC
  Filled 2023-07-04: qty 30, 30d supply, fill #0

## 2023-07-04 MED ORDER — NICOTINE 14 MG/24HR TD PT24
14.0000 mg | MEDICATED_PATCH | Freq: Every day | TRANSDERMAL | Status: DC
  Administered 2023-07-04: 14 mg via TRANSDERMAL
  Filled 2023-07-04: qty 1

## 2023-07-04 MED ORDER — ASPIRIN 81 MG PO TBEC
81.0000 mg | DELAYED_RELEASE_TABLET | Freq: Every day | ORAL | Status: DC
  Administered 2023-07-04: 81 mg via ORAL
  Filled 2023-07-04: qty 1

## 2023-07-04 NOTE — Progress Notes (Signed)
 Discharge medications delivered to bedside [facility]

## 2023-07-04 NOTE — Discharge Instructions (Signed)
 Your cardiac CT will be scheduled at one of the below locations:   [facility] [address] [place], [place] [address] [phone]  OR  [facility] [address] [place], [place] [address] [phone]  OR   [facility] [address] [place], [place] [address] [phone]  OR   [facility] [address] [place], [place] [address] [phone]  OR   [facility] [address]  [place], [place] [address] Opening July 21, 2023  If scheduled at [facility], please arrive at the [facility] and Children's Entrance (Entrance C2) of [facility] 30 minutes prior to test start time. You can use the FREE valet parking offered at entrance C (encouraged to control the heart rate for the test)  Proceed to the [facility] Radiology Department (first floor) to check-in and test prep.  All radiology patients and guests should use entrance C2 at [facility], accessed from [facility], even though the hospital's physical address listed is [address]    If scheduled at [facility] or [facility], please arrive 15 mins early for check-in and test prep.  There is spacious parking and easy access to the radiology department from the [facility] Heart and Vascular entrance. Please enter here and check-in with the desk attendant.   If scheduled at [facility], please arrive 30 minutes early for check-in and test prep.  Please follow these instructions carefully (unless otherwise directed):  An IV will be required for this test and Nitroglycerin will be given.  Hold all erectile dysfunction medications at least 3 days (72 hrs) prior to test. (Ie viagra, cialis, sildenafil, tadalafil, etc)   On the Night Before the Test: Be sure to Drink plenty of water. Do  not consume any caffeinated/decaffeinated beverages or chocolate 12 hours prior to your test. Do not take any antihistamines 12 hours prior to your test. If the patient has contrast allergy: Patient will need a prescription for Prednisone and very clear instructions (as follows): Prednisone 50 mg - take 13 hours prior to test Take another Prednisone 50 mg 7 hours prior to test Take another Prednisone 50 mg 1 hour prior to test Take Benadryl 50 mg 1 hour prior to test Patient must complete all four doses of above prophylactic medications. Patient will need a ride after test due to Benadryl.  On the Day of the Test: Drink plenty of water until 1 hour prior to the test. Do not eat any food 1 hour prior to test. You may take your regular medications prior to the test.  Take metoprolol (Lopressor) two hours prior to test. Patients who wear a continuous glucose monitor MUST remove the device prior to scanning. FEMALES- please wear underwire-free bra if available, avoid dresses & tight clothing       After the Test: Drink plenty of water. After receiving IV contrast, you may experience a mild flushed feeling. This is normal. On occasion, you may experience a mild rash up to 24 hours after the test. This is not dangerous. If this occurs, you can take Benadryl 25 mg, Zyrtec, Claritin, or Allegra and increase your fluid intake. (Patients taking Tikosyn should avoid Benadryl, and may take Zyrtec, Claritin, or Allegra) If you experience trouble breathing, this can be serious. If it is severe call 911 IMMEDIATELY. If it is mild, please  call our office.  We will call to schedule your test 2-4 weeks out understanding that some insurance companies will need an authorization prior to the service being performed.   For more information and frequently asked questions, please visit our website : [email or web address]  For non-scheduling related questions, please contact the cardiac imaging nurse  navigator should you have any questions/concerns: Cardiac Imaging Nurse Navigators Direct Office Dial: [phone]   For scheduling needs, including cancellations and rescheduling, please call [place], [phone].

## 2023-07-04 NOTE — Progress Notes (Signed)
 Ordered outpatient coronary CTA for further evaluation of chest pain and dyspnea on exertion. Please see rounding note from today for more information. Will prescribe one time dose of Lopressor 25mg  for patient to take 2 hours prior to CTA.   Corrin Parker, PA-C 07/04/2023 12:14 PM

## 2023-07-04 NOTE — Progress Notes (Signed)
 Rounding Note    Patient Name: Kara Tapia Date of Encounter: 07/04/2023  [facility] Cardiologist: Chilton Si, MD   Subjective   No acute overnight events. Patient denies any chest pain today. Her biggest complaints today continue to be lethargy, blurry vision, and lightheadedness which she has been having for months. I had a long discussion with patient and daughter Zella Ball) - do not think these symptoms are cardiac in nature.   Inpatient Medications    Scheduled Meds:  donepezil  10 mg Oral Daily   fluticasone  2 spray Each Nare Daily   loratadine  10 mg Oral Daily   rosuvastatin  10 mg Oral Daily   Continuous Infusions:  PRN Meds: acetaminophen **OR** acetaminophen, HYDROcodone-acetaminophen, meclizine, meclizine, nitroGLYCERIN, ondansetron **OR** ondansetron (ZOFRAN) IV   Vital Signs    Vitals:   07/03/23 1157 07/03/23 1954 07/04/23 0616 07/04/23 0928  BP: (!) 145/84 (!) 127/97 114/72 129/63  Pulse: 63 76 (!) 56 66  Resp: 16 20    Temp: 98.7 F (37.1 C) 98.2 F (36.8 C) 97.6 F (36.4 C) 98 F (36.7 C)  TempSrc: Oral Oral Oral Oral  SpO2: 97% 98% 97% 100%  Weight:      Height:        Intake/Output Summary (Last 24 hours) at 07/04/2023 0941 Last data filed at 07/04/2023 0923 Gross per 24 hour  Intake 830 ml  Output 1700 ml  Net -870 ml      07/02/2023    1:09 PM 06/30/2023    9:53 AM 05/21/2023    8:27 AM  Last 3 Weights  Weight (lbs) 132 lb 132 lb 128 lb 6.4 oz  Weight (kg) 59.875 kg 59.875 kg 58.242 kg      Telemetry    Sinus arrhythmia vs normal sinus rhythm with PACs. Rates in the 50s to 60s at rest (but up to the 70s to 90s at times - suspect this is with activity). - Personally Reviewed  ECG    No new ECG tracing today. - Personally Reviewed  Physical Exam   GEN: No acute distress.   Neck: No JVD. Cardiac: RRR. No murmurs, rubs, or gallops.  Respiratory: Clear to auscultation bilaterally. MS: No lower extremity edema. No  deformity. Neuro:  No focal deficits. Psych: Normal affect. Responds appropriately.  Labs    High Sensitivity Troponin:   Recent Labs  Lab 06/30/23 1008 06/30/23 1147 07/02/23 1340 07/02/23 1925  TROPONINIHS 2 5 3 4      Chemistry Recent Labs  Lab 06/30/23 0955 07/02/23 1340 07/02/23 1925 07/03/23 0411  NA 134* 130*  --  136  K 4.3 3.7  --  3.7  CL 99 97*  --  106  CO2 27 26  --  25  GLUCOSE 86 123*  --  99  BUN 11 10  --  10  CREATININE 0.86 0.83  --  0.78  CALCIUM 9.4 9.2  --  9.0  MG  --   --  2.0 2.1  PROT  --   --  6.4* 5.9*  ALBUMIN  --   --  3.8 3.4*  AST  --   --  22 17  ALT  --   --  20 18  ALKPHOS  --   --  55 50  BILITOT  --   --  0.7 1.0  GFRNONAA >60 >60  --  >60  ANIONGAP 8 7  --  5    Lipids  Recent  Labs  Lab 07/04/23 0318  CHOL 128  TRIG 64  HDL 68  LDLCALC 47  CHOLHDL 1.9    Hematology Recent Labs  Lab 06/30/23 0955 07/02/23 1340 07/03/23 0411  WBC 4.7 5.6 5.5  RBC 4.35 3.98 3.73*  HGB 14.5 13.1 12.6  HCT 43.1 39.6 37.6  MCV 99.1 99.5 100.8*  MCH 33.3 32.9 33.8  MCHC 33.6 33.1 33.5  RDW 12.4 12.2 12.3  PLT 247 229 223   Thyroid  Recent Labs  Lab 07/03/23 0411  TSH 1.470    BNPNo results for input(s): "BNP", "PROBNP" in the last 168 hours.  DDimer No results for input(s): "DDIMER" in the last 168 hours.   Radiology    ECHOCARDIOGRAM COMPLETE Result Date: 07/03/2023    ECHOCARDIOGRAM REPORT   Patient Name:   Kara Tapia Date of Exam: 07/03/2023 Medical Rec #:  [id number]     Height:       65.0 in Accession #:    [id number]    Weight:       132.0 lb Date of Birth:  [birth year]     BSA:          1.658 m Patient Age:    83 years      BP:           145/84 mmHg Patient Gender: F             HR:           61 bpm. Exam Location:  Inpatient Procedure: 2D Echo, Cardiac Doppler and Color Doppler (Both Spectral and Color            Flow Doppler were utilized during procedure). Indications:    Chest pain  History:        Patient has  prior history of Echocardiogram examinations, most                 recent 11/24/2018. CAD, COPD, Signs/Symptoms:Dyspnea, Chest Pain                 and Syncope; Risk Factors:Hypertension.  Sonographer:    Vern Claude Referring Phys: [id number] Avalie Oconnor E Kai Calico IMPRESSIONS  1. Left ventricular ejection fraction, by estimation, is 60 to 65%. The left ventricle has normal function. The left ventricle has no regional wall motion abnormalities. Left ventricular diastolic parameters were normal.  2. Right ventricular systolic function is normal. The right ventricular size is normal. There is normal pulmonary artery systolic pressure.  3. The mitral valve is normal in structure. No evidence of mitral valve regurgitation. No evidence of mitral stenosis.  4. Tricuspid valve regurgitation is mild to moderate.  5. The aortic valve is normal in structure. Aortic valve regurgitation is not visualized. No aortic stenosis is present.  6. The inferior vena cava is normal in size with greater than 50% respiratory variability, suggesting right atrial pressure of 3 mmHg. FINDINGS  Left Ventricle: Left ventricular ejection fraction, by estimation, is 60 to 65%. The left ventricle has normal function. The left ventricle has no regional wall motion abnormalities. The left ventricular internal cavity size was normal in size. There is  no left ventricular hypertrophy. Left ventricular diastolic parameters were normal. Right Ventricle: The right ventricular size is normal. No increase in right ventricular wall thickness. Right ventricular systolic function is normal. There is normal pulmonary artery systolic pressure. The tricuspid regurgitant velocity is 2.44 m/s, and  with an assumed right atrial pressure of 3 mmHg, the estimated right ventricular systolic  pressure is 26.8 mmHg. Left Atrium: Left atrial size was normal in size. Right Atrium: Right atrial size was normal in size. Pericardium: There is no evidence of pericardial effusion.  Presence of epicardial fat layer. Mitral Valve: The mitral valve is normal in structure. No evidence of mitral valve regurgitation. No evidence of mitral valve stenosis. MV peak gradient, 2.7 mmHg. The mean mitral valve gradient is 1.0 mmHg. Tricuspid Valve: The tricuspid valve is normal in structure. Tricuspid valve regurgitation is mild to moderate. No evidence of tricuspid stenosis. Aortic Valve: The aortic valve is normal in structure. Aortic valve regurgitation is not visualized. No aortic stenosis is present. Aortic valve mean gradient measures 3.0 mmHg. Aortic valve peak gradient measures 4.6 mmHg. Aortic valve area, by VTI measures 1.63 cm. Pulmonic Valve: The pulmonic valve was normal in structure. Pulmonic valve regurgitation is not visualized. No evidence of pulmonic stenosis. Aorta: The aortic root is normal in size and structure. Venous: The inferior vena cava is normal in size with greater than 50% respiratory variability, suggesting right atrial pressure of 3 mmHg. IAS/Shunts: No atrial level shunt detected by color flow Doppler.  LEFT VENTRICLE PLAX 2D LVIDd:         3.90 cm      Diastology LVIDs:         2.30 cm      LV e' medial:    6.96 cm/s LV PW:         0.70 cm      LV E/e' medial:  8.9 LV IVS:        0.80 cm      LV e' lateral:   10.10 cm/s LVOT diam:     1.80 cm      LV E/e' lateral: 6.1 LV SV:         38 LV SV Index:   23 LVOT Area:     2.54 cm  LV Volumes (MOD) LV vol d, MOD A2C: 63.9 ml LV vol d, MOD A4C: 123.0 ml LV vol s, MOD A2C: 24.5 ml LV vol s, MOD A4C: 40.4 ml LV SV MOD A2C:     39.4 ml LV SV MOD A4C:     123.0 ml LV SV MOD BP:      54.8 ml RIGHT VENTRICLE             IVC RV Basal diam:  3.30 cm     IVC diam: 1.10 cm RV Mid diam:    2.30 cm RV S prime:     11.50 cm/s TAPSE (M-mode): 2.2 cm LEFT ATRIUM             Index        RIGHT ATRIUM           Index LA diam:        1.90 cm 1.15 cm/m   RA Area:     10.50 cm LA Vol (A2C):   16.7 ml 10.07 ml/m  RA Volume:   19.00 ml  11.46  ml/m LA Vol (A4C):   25.6 ml 15.44 ml/m LA Biplane Vol: 23.1 ml 13.93 ml/m  AORTIC VALVE                    PULMONIC VALVE AV Area (Vmax):    1.83 cm     PV Vmax:       0.60 m/s AV Area (Vmean):   1.58 cm     PV Peak grad:  1.5 mmHg AV Area (  VTI):     1.63 cm AV Vmax:           107.00 cm/s AV Vmean:          76.900 cm/s AV VTI:            0.232 m AV Peak Grad:      4.6 mmHg AV Mean Grad:      3.0 mmHg LVOT Vmax:         77.00 cm/s LVOT Vmean:        47.600 cm/s LVOT VTI:          0.149 m LVOT/AV VTI ratio: 0.64  AORTA Ao Root diam: 3.20 cm Ao Asc diam:  3.30 cm MITRAL VALVE               TRICUSPID VALVE MV Area (PHT): 4.93 cm    TR Peak grad:   23.8 mmHg MV Area VTI:   2.31 cm    TR Vmax:        244.00 cm/s MV Peak grad:  2.7 mmHg MV Mean grad:  1.0 mmHg    SHUNTS MV Vmax:       0.82 m/s    Systemic VTI:  0.15 m MV Vmean:      51.3 cm/s   Systemic Diam: 1.80 cm MV Decel Time: 154 msec MV E velocity: 61.70 cm/s MV A velocity: 75.00 cm/s MV E/A ratio:  0.82 Kardie Tobb DO Electronically signed by Thomasene Ripple DO Signature Date/Time: 07/03/2023/4:09:12 PM    Final    DG Chest 2 View Result Date: 07/02/2023 CLINICAL DATA:  Chest pressure EXAM: CHEST - 2 VIEW COMPARISON:  None Available. FINDINGS: Heart size is unchanged. No focal consolidation, pleural effusion, or pneumothorax. IMPRESSION: No active cardiopulmonary disease. Electronically Signed   By: Lowell Guitar M.D.   On: 07/02/2023 14:41    Cardiac Studies   Echocardiogram 07/03/2023: Impressions: 1. Left ventricular ejection fraction, by estimation, is 60 to 65%. The  left ventricle has normal function. The left ventricle has no regional  wall motion abnormalities. Left ventricular diastolic parameters were  normal.   2. Right ventricular systolic function is normal. The right ventricular  size is normal. There is normal pulmonary artery systolic pressure.   3. The mitral valve is normal in structure. No evidence of mitral valve   regurgitation. No evidence of mitral stenosis.   4. Tricuspid valve regurgitation is mild to moderate.   5. The aortic valve is normal in structure. Aortic valve regurgitation is  not visualized. No aortic stenosis is present.   6. The inferior vena cava is normal in size with greater than 50%  respiratory variability, suggesting right atrial pressure of 3 mmHg.    Patient Profile     83 y.o. female  with a history of syncope, hypertension, hyperlipidemia, vertigo, migraines, GERD, mild cognitive impairment, and tobacco abuse who presented to the ED on 07/02/2023 with multiple complaints including atypical chest pain for which Cardiology was consulted.   Assessment & Plan    Atypical Chest Pain Dyspnea on Exertion Coronary Artery Calcifications Patient has a history of coronary artery calcifications on a chest CT in 2014 but has never had any formal ischemic evaluation. She presented with multiple complaints including intermittent atypical chest pain for the last 2-3 days. She was having pinpoint substernal chest pain that was reproducible with palpation. She also reports she has had dyspnea on exertion for years but states it is getting worse. No dyspnea at rest.  EKG shows normal sinus rhythm no acute ischemic changes.  High-sensitivity troponin negative x 2. Chest x-ray showed no acute findings. Echo showed LVEF of 60-65% with normal wall motion and diastolic parameters, normal RV function, and mild to moderate TR. - She denies any chest pain today. - She is euvolemic on exam. No other signs or symptoms of CHF. - Continue aspirin and statin. - Current chest pain does not sound cardiac. Likely musculoskeletal. No additional inpatient ischemic work-up necessary. However, can plan for outpatient coronary CTA given coronary artery calcifications noted on prior CT in 2014 and dyspnea on exertion. Of note, she does have an irregular rhythm on telemetry (sinus arrhythmias vs PACs). Will confirm with  MD that this mild irregularity will not interfere with coronary CTA images. If so, may need to consider cardiac PET stress test instead.  Palpitations She does report frequent palpitations that will last for 5 minutes at a time. Prior monitor in 2020 showed no significant arrhythmias.  - Telemetry shows sinus arrhythmia vs PACs but no tachyarrhythmias.  - TSH normal.    Hypertension Has a history of hypertension with labile BP. Systolic BP has ranged from 104 to 160 since being here. - BP well controlled this morning. - She was previously on Amlodipine but PCP recently stopped this because her BP was dropping significantly with this. - Consider PRN Hydralazine for systolic BP >160 at discharge. Will discuss with MD. - A family member who is an Internal Medicine 1st year resident was concerned that she may have pheochromocytoma. This is rare. However, she does report intermittent palpitations, headaches, and diaphoresis. 24-hour urine catecholamines and metanephrines were ordered and are pending. If this is negative, consider renal artery dopplers to look for renal artery stenosis as cause of labile BP.   Hyperlipidemia Lipid panel this admission: Total Cholesterol 128, Triglycerides 64, HDL 68, LDL 47.  - Continue Crestor 10mg  daily.    History of Syncope She has a history of syncope for which she was evaluated by Dr. Ladona Ridgel several years ago but denies any recent episodes of this.    Otherwise, per primary team: - Hyponatremia - COPD  - Headache/ migraines  - Vertigo - Mild cognitive impairment  For questions or updates, please contact [facility] Please consult [email or web address] for contact info under        Signed, Corrin Parker, PA-C  07/04/2023, 9:41 AM

## 2023-07-04 NOTE — Plan of Care (Signed)
  Problem: Clinical Measurements: Goal: Will remain free from infection Outcome: Adequate for Discharge   Problem: Clinical Measurements: Goal: Diagnostic test results will improve Outcome: Adequate for Discharge   Problem: Clinical Measurements: Goal: Respiratory complications will improve Outcome: Adequate for Discharge   Problem: Clinical Measurements: Goal: Cardiovascular complication will be avoided Outcome: Adequate for Discharge   Problem: Activity: Goal: Risk for activity intolerance will decrease Outcome: Adequate for Discharge

## 2023-07-04 NOTE — Discharge Summary (Signed)
 Physician Discharge Summary  Kara Tapia YNW:[id number] DOB: Feb 01, 1941 DOA: 07/02/2023  PCP: Melida Quitter, MD  Admit date: 07/02/2023 Discharge date: 07/04/2023 Recommendations for Outpatient Follow-up:  Follow up with PCP in 1 weeks-call for appointment Follow-up with cardiology for outpatient CTA Please obtain BMP/CBC in one week  Discharge Dispo: Home Discharge Condition: Stable Code Status:   Code Status: Full Code Diet recommendation:  Diet Order             Diet regular Room service appropriate? Yes; Fluid consistency: Thin  Diet effective now                    Brief/Interim Summary: 83 y.o. female with medical history significant of  GERD, dementia, Vertigo, hypertension presented to the ED for evaluation of chest pressure, occurs at rest, not relieved by anything. Her PCP was worried about over controlling her BP because it drops down rapidly with her level blood pressure. Patient was seen initially on 4/7 in the ED with close shortness of breath, had a workup with negative troponin stable labs  cxr- normal.  Seen in the ED again-troponin negative.  Labs okay, cardiology consulted and admitted for further management Echocardiogram obtained, serial troponins negative, did well with PT OT and no further PT recommendation Echo showed EF 60 to 65% no RWMA RV size normal and normal function no other acute finding 24-hour urine sample ordered. Cardiology plan for outpatient follow-up and close monitoring and CTA to further evaluate. Cardio has cleared for discharge home today  Subjective: Seen and examined this morning Some dizziness No new complaints. Overnight afebrile BP holding in 114-140s, on room air   Discharge diagnoses:  Chest pain Coronary atherosclerosis Hyperlipidemia: Atypical chest pain serial troponin negative, TTE with normal EF, no wall motion abnormalities,LDL is stable at 47.Seen and cleared by cardiology  Aspirin added, continue Crestor, plan for  outpatient CT coronaries   Amnestic MCI continue Aricept. Monitor for any sign of sundowning    COPD mixed type: Chronic stable  Labile hypertension: her PCP has taken her off of Norvasc because blood pressure was dropping she restarted in the past few days because family was concerned because blood pressure was going up into 100-150s mostly.Family member who is internal medicine first year resident's is suggested for family to have patient tested for pheochromocytoma-on admission admitting discussed with family at length if they are interested in pursuing this to have this done as an outpatient setting  Other differential diagnosis for labile blood pressure would be dysautonomia. In elderly avoiding hypotension would be paramount to avoid falls and injury.  Holding antihypertensives.  On review her blood pressure has been running from 100-150s. 24hr urine CA, metanephrine has been ordered by  cardio for completeness  will be completed this afternoon-patient will need to follow-up with cardiology as outpatient for the results. added pr nhydralazine po for home  Vertigo: She did well with PT OT .Following follow-up outpatient vestibular rehab, added meclizine as needed Will do referral for vestibular PT.  Advised compression stocking and adequate hydration with 2 to 3 L of fluid daily   Mixed hyperlipidemia Continue Crestor 10 mg a day   Hyponatremia Resolved.  Monitor .  Ensure adequate oral intake    Discharge Exam: Vitals:   07/04/23 0616 07/04/23 0928  BP: 114/72 129/63  Pulse: (!) 56 66  Resp:    Temp: 97.6 F (36.4 C) 98 F (36.7 C)  SpO2: 97% 100%   General: Pt is  alert, awake, not in acute distress Cardiovascular: RRR, S1/S2 +, no rubs, no gallops Respiratory: CTA bilaterally, no wheezing, no rhonchi Abdominal: Soft, NT, ND, bowel sounds + Extremities: no edema, no cyanosis  Discharge Instructions  Discharge Instructions     Discharge instructions   Complete by: As  directed    Please call call MD or return to ER for similar or worsening recurring problem that brought you to hospital or if any fever,nausea/vomiting,abdominal pain, uncontrolled pain, chest pain,  shortness of breath or any other alarming symptoms.  Please follow-up your doctor as instructed in a week time and call the office for appointment.  Please avoid alcohol, smoking, or any other illicit substance and maintain healthy habits including taking your regular medications as prescribed.  You were cared for by a hospitalist during your hospital stay. If you have any questions about your discharge medications or the care you received while you were in the hospital after you are discharged, you can call the unit and ask to speak with the hospitalist on call if the hospitalist that took care of you is not available.  Once you are discharged, your primary care physician will handle any further medical issues. Please note that NO REFILLS for any discharge medications will be authorized once you are discharged, as it is imperative that you return to your primary care physician (or establish a relationship with a primary care physician if you do not have one) for your aftercare needs so that they can reassess your need for medications and monitor your lab values   Increase activity slowly   Complete by: As directed       Allergies as of 07/04/2023       Reactions   Lidocaine Rash, Other (See Comments)   Affected the blood pressure- almost passed out also   Novocain [procaine] Other (See Comments)   An injection at the dentist's caused salivation for 90+ days afterwards.        Medication List     STOP taking these medications    amLODipine 2.5 MG tablet Commonly known as: NORVASC       TAKE these medications    albuterol 108 (90 Base) MCG/ACT inhaler Commonly known as: VENTOLIN HFA Inhale 2 puffs into the lungs every 4 (four) hours as needed for wheezing or shortness of breath.    aspirin EC 81 MG tablet Take 1 tablet (81 mg total) by mouth daily. Swallow whole.   CALCIUM PO Take 1 tablet by mouth daily after breakfast.   clonazePAM 0.5 MG tablet Commonly known as: KLONOPIN Take 0.5 tablets (0.25 mg total) by mouth at bedtime. What changed:  when to take this reasons to take this   donepezil 10 MG tablet Commonly known as: ARICEPT Take 1 tablet (10 mg total) by mouth daily.   hydrALAZINE 25 MG tablet Commonly known as: APRESOLINE Take 1 tablet (25 mg total) by mouth 2 (two) times daily as needed for up to 30 doses (give hydralazine 25 mg to take as needed for SBP over 150).   MAGNESIUM PO Take 1 tablet by mouth daily after breakfast.   meclizine 25 MG tablet Commonly known as: ANTIVERT Take 0.5 tablets (12.5 mg total) by mouth 3 (three) times daily as needed for dizziness.   metoprolol tartrate 25 MG tablet Commonly known as: LOPRESSOR Take 1 tablet (25 mg total) by mouth once for 1 dose. Take 2 hours prior to coronary CTA scan.   rosuvastatin 10 MG tablet Commonly known  as: CRESTOR Take 10 mg by mouth daily.   SUMAtriptan 25 MG tablet Commonly known as: Imitrex Take 1 tablet (25 mg total) by mouth every 2 (two) hours as needed for migraine. May repeat in 2 hours if headache persists or recurs.   TYLENOL 500 MG tablet Generic drug: acetaminophen Take 500 mg by mouth every 6 (six) hours as needed (for headaches).   VITAMIN B12 PO Take 1 tablet by mouth daily after breakfast.   VITAMIN D3 PO Take 1 capsule by mouth daily after breakfast.        Follow-up Information     Melida Quitter, MD Follow up in 1 week(s).   Specialty: Internal Medicine Contact information: [address] [place] [address] [phone]                Allergies  Allergen Reactions   Lidocaine Rash and Other (See Comments)    Affected the blood pressure- almost passed out also   Novocain [Procaine] Other (See Comments)    An injection at the  dentist's caused salivation for 90+ days afterwards.    The results of significant diagnostics from this hospitalization (including imaging, microbiology, ancillary and laboratory) are listed below for reference.    Microbiology: No results found for this or any previous visit (from the past 240 hours).  Procedures/Studies: ECHOCARDIOGRAM COMPLETE Result Date: 07/03/2023    ECHOCARDIOGRAM REPORT   Patient Name:   IRYS NIGH Date of Exam: 07/03/2023 Medical Rec #:  [id number]     Height:       65.0 in Accession #:    [id number]    Weight:       132.0 lb Date of Birth:  07-04-1940     BSA:          1.658 m Patient Age:    82 years      BP:           145/84 mmHg Patient Gender: F             HR:           61 bpm. Exam Location:  Inpatient Procedure: 2D Echo, Cardiac Doppler and Color Doppler (Both Spectral and Color            Flow Doppler were utilized during procedure). Indications:    Chest pain  History:        Patient has prior history of Echocardiogram examinations, most                 recent 11/24/2018. CAD, COPD, Signs/Symptoms:Dyspnea, Chest Pain                 and Syncope; Risk Factors:Hypertension.  Sonographer:    Vern Claude Referring Phys: [id number] CALLIE E GOODRICH IMPRESSIONS  1. Left ventricular ejection fraction, by estimation, is 60 to 65%. The left ventricle has normal function. The left ventricle has no regional wall motion abnormalities. Left ventricular diastolic parameters were normal.  2. Right ventricular systolic function is normal. The right ventricular size is normal. There is normal pulmonary artery systolic pressure.  3. The mitral valve is normal in structure. No evidence of mitral valve regurgitation. No evidence of mitral stenosis.  4. Tricuspid valve regurgitation is mild to moderate.  5. The aortic valve is normal in structure. Aortic valve regurgitation is not visualized. No aortic stenosis is present.  6. The inferior vena cava is normal in size with greater than 50%  respiratory variability, suggesting right atrial pressure  of 3 mmHg. FINDINGS  Left Ventricle: Left ventricular ejection fraction, by estimation, is 60 to 65%. The left ventricle has normal function. The left ventricle has no regional wall motion abnormalities. The left ventricular internal cavity size was normal in size. There is  no left ventricular hypertrophy. Left ventricular diastolic parameters were normal. Right Ventricle: The right ventricular size is normal. No increase in right ventricular wall thickness. Right ventricular systolic function is normal. There is normal pulmonary artery systolic pressure. The tricuspid regurgitant velocity is 2.44 m/s, and  with an assumed right atrial pressure of 3 mmHg, the estimated right ventricular systolic pressure is 26.8 mmHg. Left Atrium: Left atrial size was normal in size. Right Atrium: Right atrial size was normal in size. Pericardium: There is no evidence of pericardial effusion. Presence of epicardial fat layer. Mitral Valve: The mitral valve is normal in structure. No evidence of mitral valve regurgitation. No evidence of mitral valve stenosis. MV peak gradient, 2.7 mmHg. The mean mitral valve gradient is 1.0 mmHg. Tricuspid Valve: The tricuspid valve is normal in structure. Tricuspid valve regurgitation is mild to moderate. No evidence of tricuspid stenosis. Aortic Valve: The aortic valve is normal in structure. Aortic valve regurgitation is not visualized. No aortic stenosis is present. Aortic valve mean gradient measures 3.0 mmHg. Aortic valve peak gradient measures 4.6 mmHg. Aortic valve area, by VTI measures 1.63 cm. Pulmonic Valve: The pulmonic valve was normal in structure. Pulmonic valve regurgitation is not visualized. No evidence of pulmonic stenosis. Aorta: The aortic root is normal in size and structure. Venous: The inferior vena cava is normal in size with greater than 50% respiratory variability, suggesting right atrial pressure of 3 mmHg.  IAS/Shunts: No atrial level shunt detected by color flow Doppler.  LEFT VENTRICLE PLAX 2D LVIDd:         3.90 cm      Diastology LVIDs:         2.30 cm      LV e' medial:    6.96 cm/s LV PW:         0.70 cm      LV E/e' medial:  8.9 LV IVS:        0.80 cm      LV e' lateral:   10.10 cm/s LVOT diam:     1.80 cm      LV E/e' lateral: 6.1 LV SV:         38 LV SV Index:   23 LVOT Area:     2.54 cm  LV Volumes (MOD) LV vol d, MOD A2C: 63.9 ml LV vol d, MOD A4C: 123.0 ml LV vol s, MOD A2C: 24.5 ml LV vol s, MOD A4C: 40.4 ml LV SV MOD A2C:     39.4 ml LV SV MOD A4C:     123.0 ml LV SV MOD BP:      54.8 ml RIGHT VENTRICLE             IVC RV Basal diam:  3.30 cm     IVC diam: 1.10 cm RV Mid diam:    2.30 cm RV S prime:     11.50 cm/s TAPSE (M-mode): 2.2 cm LEFT ATRIUM             Index        RIGHT ATRIUM           Index LA diam:        1.90 cm 1.15 cm/m   RA Area:  10.50 cm LA Vol (A2C):   16.7 ml 10.07 ml/m  RA Volume:   19.00 ml  11.46 ml/m LA Vol (A4C):   25.6 ml 15.44 ml/m LA Biplane Vol: 23.1 ml 13.93 ml/m  AORTIC VALVE                    PULMONIC VALVE AV Area (Vmax):    1.83 cm     PV Vmax:       0.60 m/s AV Area (Vmean):   1.58 cm     PV Peak grad:  1.5 mmHg AV Area (VTI):     1.63 cm AV Vmax:           107.00 cm/s AV Vmean:          76.900 cm/s AV VTI:            0.232 m AV Peak Grad:      4.6 mmHg AV Mean Grad:      3.0 mmHg LVOT Vmax:         77.00 cm/s LVOT Vmean:        47.600 cm/s LVOT VTI:          0.149 m LVOT/AV VTI ratio: 0.64  AORTA Ao Root diam: 3.20 cm Ao Asc diam:  3.30 cm MITRAL VALVE               TRICUSPID VALVE MV Area (PHT): 4.93 cm    TR Peak grad:   23.8 mmHg MV Area VTI:   2.31 cm    TR Vmax:        244.00 cm/s MV Peak grad:  2.7 mmHg MV Mean grad:  1.0 mmHg    SHUNTS MV Vmax:       0.82 m/s    Systemic VTI:  0.15 m MV Vmean:      51.3 cm/s   Systemic Diam: 1.80 cm MV Decel Time: 154 msec MV E velocity: 61.70 cm/s MV A velocity: 75.00 cm/s MV E/A ratio:  0.82 Kardie Tobb DO  Electronically signed by Thomasene Ripple DO Signature Date/Time: 07/03/2023/4:09:12 PM    Final    DG Chest 2 View Result Date: 07/02/2023 CLINICAL DATA:  Chest pressure EXAM: CHEST - 2 VIEW COMPARISON:  None Available. FINDINGS: Heart size is unchanged. No focal consolidation, pleural effusion, or pneumothorax. IMPRESSION: No active cardiopulmonary disease. Electronically Signed   By: Lowell Guitar M.D.   On: 07/02/2023 14:41   DG Chest 2 View Result Date: 06/30/2023 CLINICAL DATA:  Shortness of breath and feeling lightheaded EXAM: CHEST - 2 VIEW COMPARISON:  Chest radiograph dated 10/05/2018 FINDINGS: Normal lung volumes. No focal consolidations. No pleural effusion or pneumothorax. Enlarged cardiomediastinal silhouette is likely projectional. No acute osseous abnormality. IMPRESSION: No active cardiopulmonary disease. Electronically Signed   By: Agustin Cree M.D.   On: 06/30/2023 11:07   MR BRAIN WO CONTRAST Addendum Date: 06/15/2023 ADDENDUM : The  correct impression should state IMPRESSION: MRI scan of the brain without contrast showing moderate changes of age-related chronic small vessel disease and mild degree of generalized cerebral atrophy.  These changes appear slightly progressed compared with previous MRI from 2019.  Result Date: 06/15/2023  [facility] [address], [address] [place], [place] [address] [phone] NEUROIMAGING REPORT STUDY DATE: 06/13/2023 PATIENT NAME: ASYIA HORNUNG DOB: Dec 28, 1940 MRN: [id number] ORDERING CLINICIAN: Dr Dohmeier CLINICAL HISTORY: 83 year old patient being evaluated for mild cognitive impairment COMPARISON FILMS: MRI brain without contrast 10/08/2007 EXAM: MRI brain without  contrast TECHNIQUE: Sagittal T1 axial T1, T2, FLAIR, DWI, ADC map, T2*, coronal T2 images were obtained through the brain CONTRAST: None IMAGING SITE: [facility] imaging FINDINGS: The brain parenchyma shows age-related changes of chronic small vessel disease and mild degree of  generalized cerebral atrophy.  No structural lesion, tumor or infarct is noted.  Diffusion-weighted imaging is negative for acute ischemia.  T2 star images do not show significant microhemorrhages.  Subarachnoid spaces and ventricular system show slight dilatation.  The pituitary gland and cerebellar tonsils appear normal.  Orbits are unremarkable paranasal sinuses show mild chronic mucosal thickening.  The pituitary gland and cerebellar tonsils show no significant abnormalities.  Visualized portion of the cervical spine shows prominent spondylitic changes at C3-4 with possible mild compression.  Flow-voids of large vessel intracranial circulation appear to be patent   MRI scan of the brain without contrast showing moderate changes of age-related chronic small vessel disease and high degree of generalized cerebral atrophy.  These changes appear slightly progressed compared with previous MRI from 2019. INTERPRETING PHYSICIAN: Delia Heady, MD Certified in  Neuroimaging by American Society of Neuroimaging and United Council for Neurological Subspecialities  MR CERVICAL SPINE WO CONTRAST Result Date: 06/14/2023  [facility] [address] [place], [place] [address] [phone] NEUROIMAGING REPORT STUDY DATE: 06/13/2023 PATIENT NAME: MIRIA CAPPELLI DOB: Dec 04, 1940 MRN: [id number] ORDERING CLINICIAN: Dr. Vickey Huger CLINICAL HISTORY: 83 year old patient being evaluated for dizziness COMPARISON FILMS: None EXAM: MRI cervical spine without contrast TECHNIQUE: Sagittal T1, T2, STIR and axial T2 and T2 medic images were obtained through cervical spine CONTRAST: None IMAGING SITE: [facility] imaging FINDINGS: The cervical vertebrae demonstrate abnormal alignment with loss of forward lordotic curvature and posterior subluxation of C4 and 5 over C3 vertebrae.  There are prominent degenerative changes throughout and endplate marrow degenerative changes from C3-C6 with loss of disc height and  anterior and posterior osteophytes. C2-3 shows mild disc degenerative change and focal central disc protrusion without significant compression.  With mild facet hypertrophy left greater than right but no compression. C3-4 shows prominent disc and facet degenerative changes resulting in mild left greater than right foraminal narrowing but moderate for compression. C4-5 shows loss of disc height with prominent anterior and posterior osteophytes and facet hypertrophy with broad-based central disc osteophyte protrusion resulting in effacement of thecal sac ventrally and mild anterior canal narrowing and severe bilateral foraminal narrowing left more than right. C5-6 shows prominent disc spondylitic change with loss of disc height and central broad-based disc osteophyte protrusion as well as facet hypertrophy and ligamentum flavum hypertrophy resulting in multi factorial spinal canal stenosis and mild cord compression and severe bilateral foraminal narrowing with possible encroachment of the exiting nerve roots. C6/7 also shows prominent spondylitic change with disc endplate degenerative changes and osteophyte along with ligamentum flavum and facet hypertrophy resulting in mild canal and moderate left greater than right foraminal narrowing.  Spinal canal dimensions appear adequate except at C5-6.  Spinal cord parenchyma shows no abnormal signal intensities.  Visualized portion of the lower brainstem, craniovertebral junction are unremarkable upper thoracic vertebrae also show mild disc degenerative changes.  Paraspinal soft tissue appear unremarkable.   MRI cervical spine without contrast showing prominent spondylitic changes from C3-C6 7 most prominent at C5-6 where there is multi canal narrowing with mild cord compression and bilateral foraminal narrowing.  C4-5 also shows mild canal narrowing with severe bilateral foraminal narrowing. INTERPRETING PHYSICIAN: Delia Heady, MD Certified in  Neuroimaging by American  Society of Neuroimaging and [employer] for Neurological Subspecialities   Labs: BNP (last 3 results) No results for input(s): "BNP" in the last 8760 hours. Basic Metabolic Panel: Recent Labs  Lab 06/30/23 0955 07/02/23 1340 07/02/23 1925 07/03/23 0411  NA 134* 130*  --  136  K 4.3 3.7  --  3.7  CL 99 97*  --  106  CO2 27 26  --  25  GLUCOSE 86 123*  --  99  BUN 11 10  --  10  CREATININE 0.86 0.83  --  0.78  CALCIUM 9.4 9.2  --  9.0  MG  --   --  2.0 2.1  PHOS  --   --  3.2 3.7   Liver Function Tests: Recent Labs  Lab 07/02/23 1925 07/03/23 0411  AST 22 17  ALT 20 18  ALKPHOS 55 50  BILITOT 0.7 1.0  PROT 6.4* 5.9*  ALBUMIN 3.8 3.4*   No results for input(s): "LIPASE", "AMYLASE" in the last 168 hours. No results for input(s): "AMMONIA" in the last 168 hours. CBC: Recent Labs  Lab 06/30/23 0955 07/02/23 1340 07/03/23 0411  WBC 4.7 5.6 5.5  NEUTROABS  --   --  2.1  HGB 14.5 13.1 12.6  HCT 43.1 39.6 37.6  MCV 99.1 99.5 100.8*  PLT 247 229 223   Cardiac Enzymes: Recent Labs  Lab 07/02/23 1925  CKTOTAL 85   BNP: Invalid input(s): "POCBNP" CBG: Recent Labs  Lab 06/30/23 1011  GLUCAP 112*   D-Dimer No results for input(s): "DDIMER" in the last 72 hours. Hgb A1c No results for input(s): "HGBA1C" in the last 72 hours. Lipid Profile Recent Labs    07/04/23 0318  CHOL 128  HDL 68  LDLCALC 47  TRIG 64  CHOLHDL 1.9   Thyroid function studies Recent Labs    07/03/23 0411  TSH 1.470   Anemia work up No results for input(s): "VITAMINB12", "FOLATE", "FERRITIN", "TIBC", "IRON", "RETICCTPCT" in the last 72 hours. Urinalysis    Component Value Date/Time   COLORURINE YELLOW 06/30/2023 1008   APPEARANCEUR CLEAR 06/30/2023 1008   LABSPEC 1.010 06/30/2023 1008   PHURINE 7.5 06/30/2023 1008   GLUCOSEU NEGATIVE 06/30/2023 1008   HGBUR SMALL (A) 06/30/2023 1008   BILIRUBINUR NEGATIVE 06/30/2023 1008   KETONESUR NEGATIVE 06/30/2023 1008    PROTEINUR NEGATIVE 06/30/2023 1008   UROBILINOGEN 0.2 05/13/2011 1713   NITRITE NEGATIVE 06/30/2023 1008   LEUKOCYTESUR NEGATIVE 06/30/2023 1008   Sepsis Labs Recent Labs  Lab 06/30/23 0955 07/02/23 1340 07/03/23 0411  WBC 4.7 5.6 5.5   Microbiology No results found for this or any previous visit (from the past 240 hours).   Time coordinating discharge: 25 minutes  SIGNED: Lanae Boast, MD  [facility] 07/04/2023, 2:11 PM  If 7PM-7AM, please contact night-coverage [email or web address]

## 2023-07-09 LAB — METANEPHRINES, URINE, 24 HOUR
Metaneph Total, Ur: 33 ug/L
Metanephrines, 24H Ur: 65 ug/(24.h) (ref 36–209)
Normetanephrine, 24H Ur: 146 ug/(24.h) (ref 131–612)
Normetanephrine, Ur: 74 ug/L
Total Volume: 1975

## 2023-07-10 ENCOUNTER — Encounter: Payer: Self-pay | Admitting: Internal Medicine

## 2023-07-10 ENCOUNTER — Ambulatory Visit: Payer: Medicare Other | Attending: Internal Medicine | Admitting: Internal Medicine

## 2023-07-10 VITALS — BP 128/76 | HR 71 | Ht 65.0 in | Wt 130.4 lb

## 2023-07-10 DIAGNOSIS — E785 Hyperlipidemia, unspecified: Secondary | ICD-10-CM | POA: Diagnosis not present

## 2023-07-10 DIAGNOSIS — M8580 Other specified disorders of bone density and structure, unspecified site: Secondary | ICD-10-CM | POA: Diagnosis not present

## 2023-07-10 DIAGNOSIS — R42 Dizziness and giddiness: Secondary | ICD-10-CM | POA: Diagnosis not present

## 2023-07-10 DIAGNOSIS — E782 Mixed hyperlipidemia: Secondary | ICD-10-CM | POA: Diagnosis not present

## 2023-07-10 DIAGNOSIS — I10 Essential (primary) hypertension: Secondary | ICD-10-CM

## 2023-07-10 NOTE — Patient Instructions (Addendum)
 Medication Instructions:  Your physician recommends that you continue on your current medications as directed. Please refer to the Current Medication list given to you today.  *If you need a refill on your cardiac medications before your next appointment, please call your pharmacy*  Lab Work: None ordered.  You may go to any [facility] Location for your lab work:  [facility] - [address] ([facility]) - [address] [phone] [address]  [place] - [address]   [place]  - [address]   [place] - [address] - [address] [employer]  - [address] [place] - [address] ([facility]   If you have labs (blood work) drawn today and your tests are completely normal, you will receive your results only by: [employer] (if you have MyChart)  If you have any lab test that is abnormal or we need to change your treatment, we will call you or send a MyChart message to review the results.  Testing/Procedures: None ordered.  Follow-Up: At [facility], you and your health needs are our priority.  As part of our continuing mission to provide you with exceptional heart care, we have created designated Provider Care Teams.  These Care Teams include your primary Cardiologist (physician) and Advanced Practice Providers (APPs -  Physician Assistants and Nurse Practitioners) who all work together to provide you with the care you need, when you need it.  Your next appointment:   6 months  The format for your next appointment:   In Person  Provider:   Manya Sells, MD{or one of the following Advanced Practice Providers on your designated Care Team:   Mertha Abrahams, [place] Bambi Lever "Jonelle Neri" [place], [place] Neda Balk, NP  Note: Remote monitoring is used to monitor your Pacemaker/ ICD from home. This monitoring reduces the number of office visits required to check your  device to one time per year. It allows us  to keep an eye on the functioning of your device to ensure it is working properly.            Valet parking services will be available as well.

## 2023-07-10 NOTE — Progress Notes (Signed)
 HPI Kara Tapia returns today for followup. She is a pleasant 83 yo woman with a h/o HTN, and syncope who has done well in the interim, she has had problems with feeling bad, including vertigo, palpitations and sob. She has also had non-cardiac chest pain. The patient was in her usual state of health until a month ago. She had the above symptoms. Went to the ED twice. Workup was unremarkable. A 2D echo showed normal LV function and normal valve function. Tele was unremarkable as were cardiac enzymes. She is now "98% better." She admits to being sedentary since her illness.  Allergies  Allergen Reactions   Lidocaine Rash and Other (See Comments)    Affected the blood pressure- almost passed out also   Novocain [Procaine] Other (See Comments)    An injection at the dentist's caused salivation for 90+ days afterwards.     Current Outpatient Medications  Medication Sig Dispense Refill   albuterol (VENTOLIN HFA) 108 (90 Base) MCG/ACT inhaler Inhale 2 puffs into the lungs every 4 (four) hours as needed for wheezing or shortness of breath.     aspirin EC 81 MG tablet Take 1 tablet (81 mg total) by mouth daily. Swallow whole. 30 tablet 0   CALCIUM PO Take 1 tablet by mouth daily after breakfast.     Cholecalciferol (VITAMIN D3 PO) Take 1 capsule by mouth daily after breakfast.     clonazePAM (KLONOPIN) 0.5 MG tablet Take 0.5 tablets (0.25 mg total) by mouth at bedtime. (Patient taking differently: Take 0.25 mg by mouth at bedtime as needed (for sleep).) 15 tablet 5   Cyanocobalamin (VITAMIN B12 PO) Take 1 tablet by mouth daily after breakfast.     donepezil (ARICEPT) 10 MG tablet Take 1 tablet (10 mg total) by mouth daily. 90 tablet 3   hydrALAZINE (APRESOLINE) 25 MG tablet Take 1 tablet (25 mg total) by mouth 2 (two) times daily as needed for up to 30 doses (give hydralazine 25 mg to take as needed for SBP over 150). 30 tablet 0   MAGNESIUM PO Take 1 tablet by mouth daily after breakfast.      meclizine (ANTIVERT) 25 MG tablet Take 0.5 tablets (12.5 mg total) by mouth 3 (three) times daily as needed for dizziness. 15 tablet 0   rosuvastatin (CRESTOR) 10 MG tablet Take 10 mg by mouth daily.     SUMAtriptan (IMITREX) 25 MG tablet Take 1 tablet (25 mg total) by mouth every 2 (two) hours as needed for migraine. May repeat in 2 hours if headache persists or recurs. 10 tablet 0   TYLENOL 500 MG tablet Take 500 mg by mouth every 6 (six) hours as needed (for headaches).     metoprolol tartrate (LOPRESSOR) 25 MG tablet Take 1 tablet (25 mg total) by mouth once for 1 dose. Take 2 hours prior to coronary CTA scan. 1 tablet 0   No current facility-administered medications for this visit.     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   Past Surgical History:  Procedure Laterality Date   CARPAL TUNNEL RELEASE Right    CATARACT EXTRACTION, BILATERAL Bilateral      Family History  Problem Relation Age of Onset   Diabetes Mother    Heart  disease Mother    Cancer Mother        type unknown   Bone cancer Father    Diabetes Sister    Lung disease Sister    COPD Sister    Heart attack Paternal Grandfather    Down syndrome Daughter    Dementia Daughter    Other Daughter        colon surgery     Social History   Socioeconomic History   Marital status: Divorced    Spouse name: Not on file   Number of children: 3   Years of education: 16   Highest education level: Not on file  Occupational History   Occupation: retired  Tobacco Use   Smoking status: Every Day    Current packs/day: 0.50    Types: Cigarettes    Passive exposure: Never   Smokeless tobacco: Never  Vaping Use   Vaping status: Never Used  Substance and Sexual Activity   Alcohol use: Yes    Alcohol/week: 5.0 standard drinks of alcohol    Types: 5 Glasses of wine per week    Comment: 1 glass of wine nightly   Drug use: No   Sexual activity: Not on file  Other Topics Concern   Not on file  Social History Narrative   Drinks 3-4 cups of caffeine daily.   Social Drivers of Corporate investment banker Strain: Not on file  Food Insecurity: No Food Insecurity (07/03/2023)   Hunger Vital Sign    Worried About Running Out of Food in the Last Year: Never true    Ran Out of Food in the Last Year: Never true  Transportation Needs: No Transportation Needs (07/03/2023)   PRAPARE - Administrator, Civil Service (Medical): No    Lack of Transportation (Non-Medical): No  Physical Activity: Not on file  Stress: Not on file  Social Connections: Moderately Integrated (07/03/2023)   Social Connection and Isolation Panel [NHANES]    Frequency of Communication with Friends and Family: Twice a week    Frequency of Social Gatherings with Friends and Family: Once a week    Attends Religious Services: 1 to 4 times per year    Active Member of [employer] or Organizations: Yes    Attends Banker Meetings: 1 to 4 times per year    Marital Status:  Divorced  Intimate Partner Violence: Not At Risk (07/03/2023)   Humiliation, Afraid, Rape, and Kick questionnaire    Fear of Current or Ex-Partner: No    Emotionally Abused: No    Physically Abused: No    Sexually Abused: No     BP 128/76   Pulse 71  Ht 5\' 5"  (1.651 m)   Wt 130 lb 6.4 oz (59.1 kg)   SpO2 99%   BMI 21.70 kg/m   Physical Exam:  Well appearing 83 yo woman, NAD HEENT: Unremarkable Neck:  No JVD, no thyromegally Lymphatics:  No adenopathy Back:  No CVA tenderness Lungs:  Clear with no wheezes HEART:  Regular rate rhythm, no murmurs, no rubs, no clicks Abd:  soft, positive bowel sounds, no organomegally, no rebound, no guarding Ext:  2 plus pulses, no edema, no cyanosis, no clubbing Skin:  No rashes no nodules Neuro:  CN II through XII intact, motor grossly intact  EKG - reviewed from 4/10. NSR  Assess/Plan: Fatigue/malaise/atypical chest pain  now improved - I strongly suspect that she had a viral syndrome which has resolved. HTN - her bp fluctuates and she has been prescribed hydralazine for sbp about 150 which I agree with. We discussed the importance of not missing any meals. Hypotension - review of her bp's shows occaisions of low bp. I encouraged her not to miss any meals and she can increase the sodium if her bp runs low.  Atypical chest pain - she has a CT scan pending.   Pete Brand Kara Longshore,MD

## 2023-07-14 DIAGNOSIS — D72819 Decreased white blood cell count, unspecified: Secondary | ICD-10-CM | POA: Diagnosis not present

## 2023-07-14 NOTE — Telephone Encounter (Signed)
 am happy to have my nurse work you in next week.  We will need to do orthostatic  BP and  eye movements. I also like for you to have  a visit with ENT as you asked for.

## 2023-07-16 NOTE — Telephone Encounter (Signed)
 Error

## 2023-07-17 DIAGNOSIS — J449 Chronic obstructive pulmonary disease, unspecified: Secondary | ICD-10-CM | POA: Diagnosis not present

## 2023-07-17 DIAGNOSIS — Z1331 Encounter for screening for depression: Secondary | ICD-10-CM | POA: Diagnosis not present

## 2023-07-17 DIAGNOSIS — Z1339 Encounter for screening examination for other mental health and behavioral disorders: Secondary | ICD-10-CM | POA: Diagnosis not present

## 2023-07-17 DIAGNOSIS — Z0000 Encounter for general adult medical examination without abnormal findings: Secondary | ICD-10-CM | POA: Diagnosis not present

## 2023-07-29 DIAGNOSIS — Z961 Presence of intraocular lens: Secondary | ICD-10-CM | POA: Diagnosis not present

## 2023-07-29 DIAGNOSIS — H04123 Dry eye syndrome of bilateral lacrimal glands: Secondary | ICD-10-CM | POA: Diagnosis not present

## 2023-07-29 DIAGNOSIS — H26493 Other secondary cataract, bilateral: Secondary | ICD-10-CM | POA: Diagnosis not present

## 2023-07-29 DIAGNOSIS — H43393 Other vitreous opacities, bilateral: Secondary | ICD-10-CM | POA: Diagnosis not present

## 2023-07-30 ENCOUNTER — Ambulatory Visit (INDEPENDENT_AMBULATORY_CARE_PROVIDER_SITE_OTHER): Admitting: Neurology

## 2023-07-30 ENCOUNTER — Encounter (HOSPITAL_COMMUNITY): Payer: Self-pay

## 2023-07-30 VITALS — BP 122/84 | HR 72 | Ht 66.0 in | Wt 130.0 lb

## 2023-07-30 DIAGNOSIS — M6281 Muscle weakness (generalized): Secondary | ICD-10-CM | POA: Diagnosis not present

## 2023-07-30 DIAGNOSIS — R29898 Other symptoms and signs involving the musculoskeletal system: Secondary | ICD-10-CM

## 2023-07-30 DIAGNOSIS — R269 Unspecified abnormalities of gait and mobility: Secondary | ICD-10-CM

## 2023-07-30 NOTE — Patient Instructions (Addendum)
 Kara Tapia is a 83 y.o. female patient who is here for revisit 07/30/2023 for  results of tests.  Has age appropriate atrophy of the brain , non lobar. Cervical spine MRI was interestingly showing severe narrowing of the spinal canal at  C4-5, c 5-6 , no myelopathy but certainly a possible cause for neck and shoulder pain. 06-14-2023.   Carotid dopplers still pending, planned for 08-01-2023 at [facility].   ED visit recently for chest pain, SOB and weakness  Shortness of Breath    Kara Tapia is a 83 y.o. female. This is an 83 year old female presenting to the emergency department with feeling jittery and with generalized weakness.  Symptoms that started this morning after eating breakfast.  Worsening shortness of breath and some lightheadedness reported as well.  Denies chest pain.  No infectious symptoms past several days.  No nausea vomiting diarrhea.  No near syncope/syncope.  No palpitations.     Chief concern according to patient : " I felt so bad in April, still don't feel well but I made that trip to [place] to my granddaughter's wedding.   ASSESSMENT AND PLAN :  83 y.o. year old female  here with:     1)  WEAKNESS _Achiness, soreness weakness and feeling to weak to climb stairs.    2) gait is slowed, dizziness and weakness.    3)  evaluate for CK- level, myopathy/ and for cervical spinal stenosis.   Referral to PT for neck ROM and stretching.    I would like to not go the neurosurgical route.

## 2023-07-30 NOTE — Progress Notes (Signed)
 Provider:  Neomia Banner, MD  Primary Care Physician:  Azalia Leo, MD [address] [place] [address]     Referring Provider: Azalia Leo, Md [address] [place],  [place] [address]          Chief Complaint according to patient   Patient presents with:                HISTORY OF PRESENT ILLNESS:    Chief concern according to patient : " I felt so bad in April, still don't feel well but I made that trip to [place] to my granddaughter's wedding. Both live in [place].     Kara Tapia is a 83 y.o. female patient who is here for revisit 07/30/2023 for  results of tests.  Has age appropriate atrophy of the brain , non lobar. Cervical spine MRI was interestingly showing severe narrowing of the spinal canal at  C4-5, c 5-6 , no myelopathy but certainly a possible cause for neck and shoulder pain. 06-14-2023.   Carotid dopplers still pending, planned for 08-01-2023 at [facility].  ED visit recently for chest pain, SOB and weakness  Shortness of Breath   Kara Tapia is a 83 y.o. female. This is an 83 year old female presenting to the emergency department with feeling jittery and with generalized weakness.  Symptoms that started this morning after eating breakfast.  Worsening shortness of breath and some lightheadedness reported as well.  Denies chest pain.  No infectious symptoms past several days.  No nausea vomiting diarrhea.  No near syncope/syncope.  No palpitations.    Chief concern according to patient : " I felt so bad in April, still don't feel well but I made that trip to [place] to my granddaughter's wedding. Both live in [place].    Review of Systems: Out of a complete 14 system review, the patient complains of only the following symptoms, and all other reviewed systems are negative.:   Social History   Socioeconomic History   Marital status: Divorced    Spouse name: Not on file   Number of children: 3   Years of education: 16   Highest education level:  Not on file  Occupational History   Occupation: retired  Tobacco Use   Smoking status: Every Day    Current packs/day: 0.50    Types: Cigarettes    Passive exposure: Never   Smokeless tobacco: Never  Vaping Use   Vaping status: Never Used  Substance and Sexual Activity   Alcohol use: Yes    Alcohol/week: 5.0 standard drinks of alcohol    Types: 5 Glasses of wine per week    Comment: 1 glass of wine nightly   Drug use: No   Sexual activity: Not on file  Other Topics Concern   Not on file  Social History Narrative   Drinks 3-4 cups of caffeine daily.   Social Drivers of Corporate investment banker Strain: Not on file  Food Insecurity: No Food Insecurity (07/03/2023)   Hunger Vital Sign    Worried About Running Out of Food in the Last Year: Never true    Ran Out of Food in the Last Year: Never true  Transportation Needs: No Transportation Needs (07/03/2023)   PRAPARE - Administrator, Civil Service (Medical): No    Lack of Transportation (Non-Medical): No  Physical Activity: Not on file  Stress: Not on file  Social Connections: Moderately Integrated (  07/03/2023)   Social Connection and Isolation Panel [NHANES]    Frequency of Communication with Friends and Family: Twice a week    Frequency of Social Gatherings with Friends and Family: Once a week    Attends Religious Services: 1 to 4 times per year    Active Member of [employer] or Organizations: Yes    Attends Banker Meetings: 1 to 4 times per year    Marital Status: Divorced    Family History  Problem Relation Age of Onset   Diabetes Mother    Heart disease Mother    Cancer Mother        type unknown   Bone cancer Father    Diabetes Sister    Lung disease Sister    COPD Sister    Heart attack Paternal Grandfather    Down syndrome Daughter    Dementia Daughter    Other Daughter        colon surgery    Past Medical History:  Diagnosis Date   Amnestic MCI (mild cognitive impairment with  memory loss) 07/06/2015   Arthritis    Basal cell carcinoma 11/16/2013   bcc,nod-lft lat clavicle- (EXC)(Dr Haverstock)   Basal cell carcinoma 12/23/2017   bcc, nod- Left superior breast (EXC) ([facility])   Heart murmur    Hematuria    History of palpitations    Hyponatremia    Insomnia 07/06/2015   Intractable migraine with aura with status migrainosus 08/09/2014   Lisping 12/02/2017   MCI (mild cognitive impairment) 06/04/2017   Migraine    Migraine with status migrainosus 08/09/2014   Ocular migraines.   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     aspirin  EC 81 MG tablet Take 1 tablet (81 mg total) by mouth daily. Swallow whole. 30 tablet 0   CALCIUM  PO Take 1 tablet by mouth daily after breakfast.     Cholecalciferol (VITAMIN D3 PO) Take 1 capsule by mouth daily after breakfast.     clonazePAM  (KLONOPIN ) 0.5 MG tablet Take 0.5 tablets (0.25 mg total) by mouth at bedtime. (Patient taking differently: Take 0.25 mg by mouth at bedtime as needed (for sleep).) 15 tablet 5   Cyanocobalamin  (VITAMIN B12 PO) Take 1 tablet by mouth daily after breakfast.     donepezil  (ARICEPT ) 10 MG tablet Take 1 tablet (10 mg total) by mouth daily. 90 tablet 3   hydrALAZINE  (APRESOLINE ) 25 MG tablet Take 1 tablet (25 mg total) by mouth 2 (two) times daily as  needed for up to 30 doses (give hydralazine  25 mg to take as needed for SBP over 150). 30 tablet 0   MAGNESIUM PO Take 1 tablet by mouth daily after breakfast.     meclizine  (ANTIVERT ) 25 MG tablet Take 0.5 tablets (12.5 mg total) by mouth 3 (three) times  daily as needed for dizziness. 15 tablet 0   omeprazole (PRILOSEC) 20 MG capsule Take 20 mg by mouth every morning.     rosuvastatin  (CRESTOR ) 10 MG tablet Take 10 mg by mouth daily.     SUMAtriptan  (IMITREX ) 25 MG tablet Take 1 tablet (25 mg total) by mouth every 2 (two) hours as needed for migraine. May repeat in 2 hours if headache persists or recurs. 10 tablet 0   TYLENOL  500 MG tablet Take 500 mg by mouth every 6 (six) hours as needed (for headaches).     metoprolol  tartrate (LOPRESSOR ) 25 MG tablet Take 1 tablet (25 mg total) by mouth once for 1 dose. Take 2 hours prior to coronary CTA scan. 1 tablet 0   No current facility-administered medications on file prior to visit.    Allergies  Allergen Reactions   Lidocaine Rash and Other (See Comments)    Affected the blood pressure- almost passed out also   Gabapentin Nausea Only    Stomach pain   Novocain [Procaine] Other (See Comments)    An injection at the dentist's caused salivation for 90+ days afterwards.     DIAGNOSTIC DATA (LABS, IMAGING, TESTING) - I reviewed patient records, labs, notes, testing and imaging myself where available.  Lab Results  Component Value Date   WBC 5.5 07/03/2023   HGB 12.6 07/03/2023   HCT 37.6 07/03/2023   MCV 100.8 (H) 07/03/2023   PLT 223 07/03/2023      Component Value Date/Time   NA 136 07/03/2023 0411   NA 135 05/21/2023 0932   K 3.7 07/03/2023 0411   CL 106 07/03/2023 0411   CO2 25 07/03/2023 0411   GLUCOSE 99 07/03/2023 0411   BUN 10 07/03/2023 0411   BUN 12 05/21/2023 0932   CREATININE 0.78 07/03/2023 0411   CALCIUM  9.0 07/03/2023 0411   PROT 5.9 (L) 07/03/2023 0411   PROT 7.0 05/21/2023 0932   ALBUMIN 3.4 (L) 07/03/2023 0411    ALBUMIN 4.9 (H) 05/21/2023 0932   AST 17 07/03/2023 0411   ALT 18 07/03/2023 0411   ALKPHOS 50 07/03/2023 0411   BILITOT 1.0 07/03/2023 0411   BILITOT 0.3 05/21/2023 0932   GFRNONAA >60 07/03/2023 0411   GFRAA 71 11/18/2018 1418   Lab Results  Component Value Date   CHOL 128 07/04/2023   HDL 68 07/04/2023   LDLCALC 47 07/04/2023   TRIG 64 07/04/2023   CHOLHDL 1.9 07/04/2023   Lab Results  Component Value Date   HGBA1C 5.6 05/21/2023   Lab Results  Component Value Date   VITAMINB12 367 05/21/2023   Lab Results  Component Value Date   TSH 1.470 07/03/2023    PHYSICAL EXAM:  Today's Vitals   07/30/23 1513  BP: 122/84  Pulse: 72  Weight: 130 lb (59 kg)  Height: 5\' 6"  (1.676 m)   Body mass index is 20.98 kg/m.   Wt Readings from Last 3 Encounters:  07/30/23 130 lb (59 kg)  07/10/23 130 lb 6.4 oz (59.1 kg)  07/02/23 132 lb (59.9 kg)     Ht Readings from Last 3 Encounters:  07/30/23 5\' 6"  (1.676 m)  07/10/23 5\' 5"  (1.651 m)  07/02/23 5\' 5"  (1.651 m)      General: The patient is awake, alert and appears not in acute distress. The patient is well groomed. Head: Normocephalic, atraumatic. Neck is supple. Cardiovascular:  Regular rate and cardiac rhythm by pulse,  without distended neck veins. Respiratory: Lungs are clear  to auscultation.  Skin:  Without evidence of ankle edema, or rash. Trunk: The patient's posture is erect.   NEUROLOGIC EXAM: The patient is awake and alert, oriented to place and time.   Memory subjective described as intact.  Attention span & concentration ability appears normal.  Speech is fluent,  without  dysarthria, mild  dysphonia , no  aphasia.  Mood and affect are appropriate.   Cranial nerves: no loss of smell or taste reported  Pupils are equal and briskly reactive to light. Funduscopic exam def.  Extraocular movements in vertical and horizontal planes were intact and without nystagmus. No Diplopia. Visual fields by finger  perimetry are intact. Hearing was intact to soft voice and finger rubbing.    Facial sensation intact to fine touch.  Facial motor strength is symmetric and tongue and uvula move midline.  Neck ROM : rotation, tilt and flexion extension were normal for age and shoulder shrug was symmetrical.    Motor exam:  left foot  weakness with dorsiflexion.   Normal tone without cog wheeling, symmetric grip strength .   Sensory:  vibration reduced over both ankles and in both feet   Coordination: Rapid alternating movements in the fingers/hands were of normal speed.  The Finger-to-nose maneuver was intact without evidence of ataxia, dysmetria or tremor.   Gait and station: Patient could rise unassisted from a seated position, walked without assistive device.  She walked slower but slightly unsteady and with fragmented turns.   Toe and heel walk were deferred.  Deep tendon reflexes: in the lower extremities are symmetric and intact.      ASSESSMENT AND PLAN :  83 y.o. year old female  here with:    1)  WEAKNESS _Achiness, soreness weakness and feeling to weak to climb stairs.   2) gait is slowed, dizziness and weakness.   3)  evaluate for CK- level, myopathy/ and for cervical spinal stenosis.  Referral to PT for neck ROM and stretching.   I would like to not go the surgical route.     I plan to follow up either personally or through our NP within 6 months.   I would like to thank Charolet Cope Chales [place], MD  for allowing me to meet with and to take care of this pleasant patient.     After spending a total time of  35  minutes face to face and additional time for physical and neurologic examination, review of laboratory studies,  personal review of imaging studies, reports and results of other testing and review of referral information / records as far as provided in visit,   Electronically signed by: Neomia Banner, MD 07/30/2023 3:37 PM  [facility]  Sleep Board certified by The [employer] of Sleep Medicine and Diplomate of the [employer] of Sleep Medicine. Board certified In Neurology through the ABPN, Fellow of the [employer] of Neurology.

## 2023-08-01 ENCOUNTER — Ambulatory Visit (HOSPITAL_COMMUNITY)
Admission: RE | Admit: 2023-08-01 | Discharge: 2023-08-01 | Disposition: A | Discharge status: Home / Self Care | Source: Ambulatory Visit | Attending: Student | Admitting: Student

## 2023-08-01 DIAGNOSIS — I2510 Atherosclerotic heart disease of native coronary artery without angina pectoris: Secondary | ICD-10-CM | POA: Diagnosis not present

## 2023-08-01 DIAGNOSIS — R072 Precordial pain: Secondary | ICD-10-CM | POA: Insufficient documentation

## 2023-08-01 LAB — MULTIPLE MYELOMA PANEL, SERUM
Albumin SerPl Elph-Mcnc: 4 g/dL (ref 2.9–4.4)
Albumin/Glob SerPl: 1.6 (ref 0.7–1.7)
Alpha 1: 0.2 g/dL (ref 0.0–0.4)
Alpha2 Glob SerPl Elph-Mcnc: 0.7 g/dL (ref 0.4–1.0)
B-Globulin SerPl Elph-Mcnc: 1 g/dL (ref 0.7–1.3)
Gamma Glob SerPl Elph-Mcnc: 0.7 g/dL (ref 0.4–1.8)
Globulin, Total: 2.6 g/dL (ref 2.2–3.9)
IgA/Immunoglobulin A, Serum: 134 mg/dL (ref 64–422)
IgG (Immunoglobin G), Serum: 717 mg/dL (ref 586–1602)
IgM (Immunoglobulin M), Srm: 176 mg/dL (ref 26–217)
Total Protein: 6.6 g/dL (ref 6.0–8.5)

## 2023-08-01 LAB — CK TOTAL AND CKMB (NOT AT ARMC)
CK-MB Index: 2.1 ng/mL (ref 0.0–5.3)
Total CK: 71 U/L (ref 26–161)

## 2023-08-01 LAB — ANA W/REFLEX IF POSITIVE: Anti Nuclear Antibody (ANA): NEGATIVE

## 2023-08-01 MED ORDER — METOPROLOL TARTRATE 5 MG/5ML IV SOLN: 10.0000 mg | Freq: Once | INTRAVENOUS | Status: DC | PRN

## 2023-08-01 MED ORDER — IOHEXOL 350 MG/ML SOLN
100.0000 mL | Freq: Once | INTRAVENOUS | Status: AC | PRN
  Administered 2023-08-01: 100 mL via INTRAVENOUS

## 2023-08-01 MED ORDER — NITROGLYCERIN 0.4 MG SL SUBL
0.8000 mg | SUBLINGUAL_TABLET | Freq: Once | SUBLINGUAL | Status: AC
  Administered 2023-08-01: 0.8 mg via SUBLINGUAL

## 2023-08-01 MED ORDER — DILTIAZEM HCL 25 MG/5ML IV SOLN: 10.0000 mg | INTRAVENOUS | Status: DC | PRN

## 2023-08-02 ENCOUNTER — Encounter (HOSPITAL_BASED_OUTPATIENT_CLINIC_OR_DEPARTMENT_OTHER): Payer: Self-pay | Admitting: Cardiovascular Disease

## 2023-08-04 ENCOUNTER — Other Ambulatory Visit: Payer: Self-pay | Admitting: Internal Medicine

## 2023-08-04 ENCOUNTER — Ambulatory Visit (HOSPITAL_COMMUNITY)
Admission: RE | Admit: 2023-08-04 | Discharge: 2023-08-04 | Disposition: A | Discharge status: Home / Self Care | Payer: Self-pay | Source: Ambulatory Visit | Attending: Internal Medicine | Admitting: Internal Medicine

## 2023-08-04 DIAGNOSIS — R931 Abnormal findings on diagnostic imaging of heart and coronary circulation: Secondary | ICD-10-CM | POA: Diagnosis not present

## 2023-08-05 ENCOUNTER — Ambulatory Visit: Payer: Self-pay | Admitting: Student

## 2023-08-06 DIAGNOSIS — M8580 Other specified disorders of bone density and structure, unspecified site: Secondary | ICD-10-CM | POA: Diagnosis not present

## 2023-08-06 DIAGNOSIS — Z79899 Other long term (current) drug therapy: Secondary | ICD-10-CM | POA: Diagnosis not present

## 2023-08-07 DIAGNOSIS — D141 Benign neoplasm of larynx: Secondary | ICD-10-CM | POA: Diagnosis not present

## 2023-08-07 DIAGNOSIS — R490 Dysphonia: Secondary | ICD-10-CM | POA: Diagnosis not present

## 2023-08-07 DIAGNOSIS — R2681 Unsteadiness on feet: Secondary | ICD-10-CM | POA: Diagnosis not present

## 2023-08-07 DIAGNOSIS — J300 Vasomotor rhinitis: Secondary | ICD-10-CM | POA: Diagnosis not present

## 2023-08-20 ENCOUNTER — Other Ambulatory Visit (HOSPITAL_COMMUNITY): Payer: Self-pay | Admitting: Internal Medicine

## 2023-08-21 ENCOUNTER — Telehealth: Payer: Self-pay

## 2023-08-21 ENCOUNTER — Other Ambulatory Visit (HOSPITAL_COMMUNITY): Payer: Self-pay | Admitting: Internal Medicine

## 2023-08-21 ENCOUNTER — Other Ambulatory Visit: Payer: Self-pay | Admitting: Internal Medicine

## 2023-08-21 NOTE — Telephone Encounter (Signed)
 Auth Submission: NO AUTH NEEDED Site of care: [facility] Payer: BCBS MEDICARE Medication & CPT/J Code(s) submitted: Reclast  (Zolendronic acid) [id number] Route of submission (phone, fax, portal): PHONE Phone # Fax # Auth type: Buy/Bill PB Units/visits requested: 5MG , 1 DOSE Reference number: [place] [id number] Approval from: 08/21/23 to 03/24/24   Auth Submission: NO AUTH NEEDED Site of care: Site of care: [facility] Payer: aetna state Medication & CPT/J Code(s) submitted: Reclast  (Zolendronic acid) [id number] Route of submission (phone, fax, portal): portal Phone # Fax # Auth type: Buy/Bill PB Units/visits requested: 5mg , 1 dose Reference number:  Approval from: 08/21/23 to 03/24/24

## 2023-08-27 ENCOUNTER — Ambulatory Visit

## 2023-08-27 VITALS — BP 134/77 | HR 80 | Temp 98.1°F | Resp 16 | Ht 66.0 in | Wt 128.0 lb

## 2023-08-27 DIAGNOSIS — M810 Age-related osteoporosis without current pathological fracture: Secondary | ICD-10-CM

## 2023-08-27 MED ORDER — SODIUM CHLORIDE 0.9 % IV SOLN: INTRAVENOUS | Status: DC

## 2023-08-27 MED ORDER — ZOLEDRONIC ACID 5 MG/100ML IV SOLN
5.0000 mg | Freq: Once | INTRAVENOUS | Status: AC
  Administered 2023-08-27: 5 mg via INTRAVENOUS
  Filled 2023-08-27: qty 100

## 2023-08-27 MED ORDER — DIPHENHYDRAMINE HCL 25 MG PO CAPS
25.0000 mg | ORAL_CAPSULE | Freq: Once | ORAL | Status: AC
  Administered 2023-08-27: 25 mg via ORAL
  Filled 2023-08-27: qty 1

## 2023-08-27 MED ORDER — ACETAMINOPHEN 325 MG PO TABS
650.0000 mg | ORAL_TABLET | Freq: Once | ORAL | Status: AC
  Administered 2023-08-27: 650 mg via ORAL
  Filled 2023-08-27: qty 2

## 2023-08-27 NOTE — Progress Notes (Signed)
 Diagnosis: Osteoporosis  Provider:  Phyllis Breeze MD  Procedure: IV Infusion  IV Type: Peripheral, IV Location: R Antecubital  Reclast  (Zolendronic Acid), Dose: 5 mg  Infusion Start Time: 1418  Infusion Stop Time: 1448  Post Infusion IV Care: Observation period completed and Peripheral IV Discontinued  Discharge: Condition: Good, Destination: Home . AVS Provided  Performed by:  Star East, LPN

## 2023-09-04 DIAGNOSIS — M8589 Other specified disorders of bone density and structure, multiple sites: Secondary | ICD-10-CM | POA: Diagnosis not present

## 2023-09-14 ENCOUNTER — Encounter: Payer: Self-pay | Admitting: Neurology

## 2023-09-16 DIAGNOSIS — R5383 Other fatigue: Secondary | ICD-10-CM | POA: Diagnosis not present

## 2023-09-16 DIAGNOSIS — R42 Dizziness and giddiness: Secondary | ICD-10-CM | POA: Diagnosis not present

## 2023-09-16 DIAGNOSIS — R3129 Other microscopic hematuria: Secondary | ICD-10-CM | POA: Diagnosis not present

## 2023-09-16 DIAGNOSIS — R55 Syncope and collapse: Secondary | ICD-10-CM | POA: Diagnosis not present

## 2023-09-16 DIAGNOSIS — I10 Essential (primary) hypertension: Secondary | ICD-10-CM | POA: Diagnosis not present

## 2023-09-17 NOTE — Telephone Encounter (Signed)
 Spoke w/Pt regarding [facility] message. Pt states she had a visit with PCP office and was told their office would contact her cardiology office and our office for Pt to get a f/u appt. Pt has an appt with Dr. Chalice 10/08/23 but Pt would like to be seen sooner if possible. Looked at NP schedules but nothing available before 10/08/23. Pt is on wait list for a cancellation. Discussed with Pt if she has more falls and has injuries to be evaluated in ED. Pt voiced understanding and thanks for the call back.

## 2023-09-18 ENCOUNTER — Ambulatory Visit: Attending: Internal Medicine

## 2023-09-18 ENCOUNTER — Telehealth: Payer: Self-pay | Admitting: *Deleted

## 2023-09-18 DIAGNOSIS — J300 Vasomotor rhinitis: Secondary | ICD-10-CM | POA: Diagnosis not present

## 2023-09-18 DIAGNOSIS — R55 Syncope and collapse: Secondary | ICD-10-CM

## 2023-09-18 DIAGNOSIS — D141 Benign neoplasm of larynx: Secondary | ICD-10-CM | POA: Diagnosis not present

## 2023-09-18 DIAGNOSIS — R490 Dysphonia: Secondary | ICD-10-CM | POA: Diagnosis not present

## 2023-09-18 DIAGNOSIS — R2681 Unsteadiness on feet: Secondary | ICD-10-CM | POA: Diagnosis not present

## 2023-09-18 NOTE — Telephone Encounter (Signed)
 Returned call to daughter and informed that Zio monitor will be mailed to pt.

## 2023-09-18 NOTE — Telephone Encounter (Signed)
 Per Dr. Waddell. Order 14 day Zio for syncope. Pt to follow up 2 weeks after monitor.  Attempted to call Grayce daughter. No answer. Left msg to call back.

## 2023-09-18 NOTE — Telephone Encounter (Signed)
Daughter is returning your call. Please call back °

## 2023-09-18 NOTE — Progress Notes (Unsigned)
Enrolled patient for a 14 day Zio XT monitor to be mailed to patients home  Ladona Ridgel to read

## 2023-09-25 DIAGNOSIS — H26493 Other secondary cataract, bilateral: Secondary | ICD-10-CM | POA: Diagnosis not present

## 2023-09-25 DIAGNOSIS — Z961 Presence of intraocular lens: Secondary | ICD-10-CM | POA: Diagnosis not present

## 2023-09-25 DIAGNOSIS — H43393 Other vitreous opacities, bilateral: Secondary | ICD-10-CM | POA: Diagnosis not present

## 2023-09-25 DIAGNOSIS — H04123 Dry eye syndrome of bilateral lacrimal glands: Secondary | ICD-10-CM | POA: Diagnosis not present

## 2023-10-08 ENCOUNTER — Encounter: Payer: Self-pay | Admitting: Neurology

## 2023-10-08 ENCOUNTER — Ambulatory Visit (INDEPENDENT_AMBULATORY_CARE_PROVIDER_SITE_OTHER): Payer: Medicare Other | Admitting: Neurology

## 2023-10-08 VITALS — BP 128/88 | HR 78 | Ht 66.0 in | Wt 127.6 lb

## 2023-10-08 DIAGNOSIS — I951 Orthostatic hypotension: Secondary | ICD-10-CM

## 2023-10-08 DIAGNOSIS — G4752 REM sleep behavior disorder: Secondary | ICD-10-CM

## 2023-10-08 DIAGNOSIS — G3184 Mild cognitive impairment, so stated: Secondary | ICD-10-CM | POA: Diagnosis not present

## 2023-10-08 NOTE — Patient Instructions (Signed)
 Falling and Passing out   Assessment is as follows here:  1)  Vertigo with orthostatic abnormal  BP - and with  slight chest pain  2)  gait impaired by the feeling of dizziness and mild neuropathic changes. 3) depression 4)    Plan:  Treatment plan and additional workup planned after today includes:   Syncope - work up is in progress through PCP/ Cardiology.   1) EEG   I am convinced that this is not a form of migraine as she reports no HA and her spells were very brief ( 1 minute ?) , or seizure ?   The spells are brief and followed by confusion , excessive fatigue   Follow up on EEG by My Chart-  I am waiting to see the ZIO patch results.   Rv 6 months

## 2023-10-08 NOTE — Progress Notes (Signed)
 [facility]  Provider:  Dr Chalice Referring Provider: Christi Jacob, NP Primary Care Physician:  Stephane Leita DEL, MD  Chief Complaint  Patient presents with   Dizziness    RM 2, Pt alone. Pt here for new issue of dizziness and syncopal episodes. Pt states she had labs with ENT 2 weeks ago but stated the ENT reported the dizziness is not caused by crystals in her inner ear. Pt has also worn a heart monitor for 2 weeks ordered from cardiologist and will have an appt in the next 2 weeks for results. Pt was hospitalized 4/8 to 07/04/23 for dizziness, heart palpitations, and BP meds were adjusted.    HPI:  Kara Tapia is a 83 y.o. female and seen here on 10/08/2023 upon referral from Dr. Christi for a Consultation/ Evaluation of dizziness.   This patient  has followed us  for many years already, .  And she has been seen here for parasomnia originally about 8 years ago she had some concerns about memory loss for which we also have tested her, and she has followed for REM sleep behavior disorder, various forms of headaches.  This new referral is based on a new concern of vertigo and dizziness the patient fell 4 times within the 2-week.  Timeframe.  She also reports blurry vision some nausea and even some shortness of breath she is extremely fatigued and in her falls she has also hit her head more than once.  Vertigo had been reported since January her primary care physician asked her to check her blood pressure regularly at home and make sure that she hydrates well and she brought me today +July's blood pressure readings and they look very good . Usually , she is around 120/90 mmHg in the morning.  In January she felt that virus let to her onset of dizziness and vertigo and fatigue.     I prescribed meclizine  and this was for dizziness as needed but it does leave the patient even more fatigued and sleepy.  In April she was hospitalized from 4-8 through 4-11 for dizziness for a racing  heart and palpitations.  Dr. Waddell then adjusted her heart medications.  She has had acupuncture especially for her generalized pain headache and neck.  She has reduced her caffeine intake her visit in primary care was associated with a correspondence with Dr. Garnette Kansky her endocrinologist.   Dr. Nichole wanted her to have further testing. She saw Cathlyn Waddell who is her cardiologist and he did not find a progression of her heart condition she wore a ZIO monitor for 2 weeks   I would like to let her know that the heart monitor was just mailed in on July 13 and she has not yet received official results.  She will have a follow-up on July 29 with Dr. Waddell.  She does need additional testing for the what she calls blackout.  The continued dizziness and the tendency to fall and her ENT doctor has already cleared her of any vestibular origin.  On July 15, yesterday, she had a appointment for an dexamethasone suppression test with cortisol levels.  On 09-16-2023 she had seen Dr. Stephane, Dr. Teresa was concerned about possible hypotensive episodes.  She also quoted that a CT chest coronary scoring was fairly normal, but she had carotid artery evaluations which showed minimal plaque, that her order was normal and her aortic valve showed no calcification and a recent echocardiogram.  There is no peripheral vascular disease present,  no history of most muscular disease but she does have arthritic joint changes.  Dr Nichole mentioned the possibility o atypical seizures.  She also is very depressed, partially about the rapid decline of her daughter Rogerio , who has Down- syndrome and now pressive Alzheimer's disease.            Kara Tapia is a 83 y.o. female patient who is here for revisit 07/30/2023 for  results of tests.  Has age appropriate atrophy of the brain , non lobar. Cervical spine MRI was interestingly showing severe narrowing of the spinal canal at  C4-5, c 5-6 , no myelopathy but certainly a possible  cause for neck and shoulder pain. 06-14-2023.   Carotid dopplers still pending, planned for [facility]01-2023 at [facility].   ED visit recently for chest pain, SOB and weakness  Shortness of Breath    Kara Tapia is a 83 y.o. female. This is an 83 year old female presenting to the emergency department with feeling jittery and with generalized weakness.  Symptoms that started this morning after eating breakfast.  Worsening shortness of breath and some lightheadedness reported as well.  Denies chest pain.  No infectious symptoms past several days.  No nausea vomiting diarrhea.  No near syncope/syncope.  No palpitations.     Chief concern according to patient :  I felt so bad in April, still don't feel well but I made that trip to [place] to my granddaughter's wedding. Both live in [place].    Review of Systems: Out of a complete 14 system review, the patient complains of only the following symptoms, and all other reviewed systems are negative.     Social History   Socioeconomic History   Marital status: Divorced    Spouse name: Not on file   Number of children: 3   Years of education: 16   Highest education level: Not on file  Occupational History   Occupation: retired  Tobacco Use   Smoking status: Every Day    Current packs/day: 0.50    Types: Cigarettes    Passive exposure: Never   Smokeless tobacco: Never  Vaping Use   Vaping status: Never Used  Substance and Sexual Activity   Alcohol  use: Yes    Alcohol /week: 5.0 standard drinks of alcohol     Types: 5 Glasses of wine per week    Comment: 1 glass of wine nightly   Drug use: Yes    Types: Other-see comments    Comment: CBD oil at night on occasion   Sexual activity: Not on file  Other Topics Concern   Not on file  Social History Narrative   Drinks 3-4 cups of caffeine daily.   Social Drivers of Corporate investment banker Strain: Not on file  Food Insecurity: No Food Insecurity (07/03/2023)   Hunger Vital Sign    Worried  About Running Out of Food in the Last Year: Never true    Ran Out of Food in the Last Year: Never true  Transportation Needs: No Transportation Needs (07/03/2023)   PRAPARE - Administrator, Civil Service (Medical): No    Lack of Transportation (Non-Medical): No  Physical Activity: Not on file  Stress: Not on file  Social Connections: Moderately Integrated (07/03/2023)   Social Connection and Isolation Panel    Frequency of Communication with Friends and Family: Twice a week    Frequency of Social Gatherings with Friends and Family: Once a week    Attends Religious Services: 1  to 4 times per year    Active Member of Clubs or Organizations: Yes    Attends Club or Organization Meetings: 1 to 4 times per year    Marital Status: Divorced  Intimate Partner Violence: Not At Risk (07/03/2023)   Humiliation, Afraid, Rape, and Kick questionnaire    Fear of Current or Ex-Partner: No    Emotionally Abused: No    Physically Abused: No    Sexually Abused: No    Family History  Problem Relation Age of Onset   Diabetes Mother    Heart disease Mother    Cancer Mother        type unknown   Bone cancer Father    Diabetes Sister    Lung disease Sister    COPD Sister    Heart attack Paternal Grandfather    Down syndrome Daughter    Dementia Daughter    Other Daughter        colon surgery    Past Medical History:  Diagnosis Date   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     aspirin  EC 81 MG tablet Take 81 mg by mouth daily.     CALCIUM  PO Take 1 tablet by mouth daily after breakfast.     Cholecalciferol (VITAMIN D3 PO) Take 1 capsule by mouth daily after breakfast.     clonazePAM  (KLONOPIN ) 0.5 MG tablet Take 0.5 tablets (0.25 mg total) by mouth at bedtime. 15 tablet 5   Cyanocobalamin  (VITAMIN B12 PO) Take 1 tablet by mouth daily after breakfast.     donepezil  (ARICEPT ) 10 MG tablet Take 1 tablet (10 mg total) by mouth daily. 90 tablet 3   ferrous sulfate 324 MG TBEC Take 324 mg by mouth every other day.     hydrALAZINE  (APRESOLINE ) 25 MG tablet Take 1 tablet (25 mg total) by mouth 2 (two) times daily as needed for up to 30 doses (give hydralazine  25 mg to take as needed for SBP over 150). 30 tablet 0   ipratropium (ATROVENT) 0.06 % nasal spray Place 2 sprays into the nose daily as needed for rhinitis.     MAGNESIUM PO Take 1 tablet by mouth daily after breakfast.     meclizine  (ANTIVERT ) 25 MG tablet  Take 0.5 tablets (12.5 mg total) by mouth 3 (three) times daily as needed for dizziness. 15 tablet 0   omeprazole (PRILOSEC) 20 MG capsule Take 20 mg by mouth every morning.     rosuvastatin  (CRESTOR ) 10 MG tablet Take 10 mg by mouth daily.     TYLENOL  500 MG tablet Take 500 mg by mouth  every 6 (six) hours as needed (for headaches).     metoprolol  tartrate (LOPRESSOR ) 25 MG tablet Take 1 tablet (25 mg total) by mouth once for 1 dose. Take 2 hours prior to coronary CTA scan. (Patient not taking: Reported on 10/08/2023) 1 tablet 0   SUMAtriptan  (IMITREX ) 25 MG tablet Take 1 tablet (25 mg total) by mouth every 2 (two) hours as needed for migraine. May repeat in 2 hours if headache persists or recurs. 10 tablet 0   No current facility-administered medications for this visit.    Allergies as of 10/08/2023 - Review Complete 10/08/2023  Allergen Reaction Noted   Lidocaine Rash and Other (See Comments) 04/03/2013   Gabapentin Nausea Only 07/30/2023   Novocain [procaine] Other (See Comments) 07/02/2023    Vitals: BP 128/88 (BP Location: Left Arm, Patient Position: Sitting, Cuff Size: Normal)   Pulse 78   Ht 5' 6 (1.676 m)   Wt 127 lb 9.6 oz (57.9 kg)   BMI 20.60 kg/m   The patient's orthostatic blood pressure showed in 983 over 104 mmHg with a heart rate of 63 which was described as regular  Standing up 146/102 mmHg with a heart rate of 71 and after 5 minutes 152/88 mmHg with a heart rate of 83.  This was a surprising paradoxical response.  The blood pressure is higher as she is lying down and her heart rate rises the longer she stands.    Physical exam:  General: The patient is awake, alert and appears not in acute distress.  The patient is well groomed. Head: Normocephalic, atraumatic.  Neck is supple.   Neck circumference: 13.5  Cardiovascular:  Regular rate and palpable peripheral pulse:  Respiratory: clear to auscultation.  Skin:  Without evidence of edema, or rash Trunk: slender    Neurologic exam : The patient is awake and alert, oriented to place and time.   Memory subjective  described as impaired but feels also depressed  There is a normal attention span & concentration ability.  Speech is fluent without  dysarthria, but some dysphonia , no aphasia.   Mood and affect are  depressed   Cranial nerves: Pupils are equal and briskly reactive to light.  Funduscopic exam : no evidence of pallor or edema.  Extraocular movements  in vertical and horizontal planes intact and without nystagmus. Visual fields by finger perimetry are intact. Hearing to finger rub intact.   Facial motor strength is symmetric and tongue and uvula move midline.  Neck ROM : rotation, tilt and flexion extension were normal for age and shoulder shrug was symmetrical.    Motor exam:  left foot  weakness with dorsiflexion.   Normal tone without cog wheeling, symmetric grip strength .   Sensory:  vibration reduced over both ankles and in both feet   Coordination: Rapid alternating movements in the fingers/hands were of normal speed.  The Finger-to-nose maneuver was intact without evidence of ataxia, dysmetria or tremor.   Gait and station: Patient could rise unassisted from a seated position, walked without assistive device.  Her left leg turned outwards- the turns were fragmented , 4 steps.  She  walked slower but slightly unsteady - normal arm swing. Minimal stooping. Toe and heel walk were deferred.  Deep tendon reflexes: in the lower extremities are symmetric and intact.      Assessment: Total time for face to face interview and examination, for review of  images and laboratory testing, neurophysiology testing and pre-existing records, including out-of -network , was 35  minutes.  Had 4 episodes of passing out with falling. Zio patch is pending.    Falling and Passing out   Assessment is as follows here:  1)  Vertigo with orthostatic abnormal  BP - and with  slight chest pain  2)  gait impaired by the feeling of dizziness and mild neuropathic changes. 3) depression 4)    Plan:  Treatment plan and additional workup planned after today includes:   Syncope - work up is in progress through PCP/ Cardiology.   1) EEG   I am convinced that this is not a form  of migraine as she reports no HA and her spells were very brief ( 1 minute ?) , or seizure ?   The spells are brief and followed by confusion , excessive fatigue   Follow up on EEG by My Chart-  I am waiting to see the ZIO patch results.   Rv 6 months    The patient's condition requires frequent monitoring and adjustments in the treatment plan, reflecting the ongoing complexity of care.  This provider is the continuing focal point for all needed services for this condition.   Dedra Gores, MD  [facility] Board certified by The [employer] of Sleep Medicine and Diplomate of the [employer] of Sleep Medicine. Board certified In Neurology through the ABPN, Fellow of the [employer] of Neu[employer].

## 2023-10-11 DIAGNOSIS — R55 Syncope and collapse: Secondary | ICD-10-CM | POA: Diagnosis not present

## 2023-10-14 ENCOUNTER — Other Ambulatory Visit: Payer: Self-pay

## 2023-10-15 ENCOUNTER — Ambulatory Visit (INDEPENDENT_AMBULATORY_CARE_PROVIDER_SITE_OTHER): Admitting: Neurology

## 2023-10-15 DIAGNOSIS — I951 Orthostatic hypotension: Secondary | ICD-10-CM

## 2023-10-15 DIAGNOSIS — G4752 REM sleep behavior disorder: Secondary | ICD-10-CM

## 2023-10-15 DIAGNOSIS — G3184 Mild cognitive impairment, so stated: Secondary | ICD-10-CM

## 2023-10-15 NOTE — Procedures (Signed)
    History:  83 year old woman with cognitive impairment   EEG classification: Awake and drowsy  Duration: 27 minutes   Technical aspects: This EEG study was done with scalp electrodes positioned according to the 10-20 International system of electrode placement. Electrical activity was reviewed with band pass filter of 1-70Hz , sensitivity of 7 uV/mm, display speed of 24mm/sec with a 60Hz  notched filter applied as appropriate. EEG data were recorded continuously and digitally stored.   Description of the recording: The background rhythms of this recording consists of a fairly well modulated medium amplitude alpha rhythm of 9 Hz that is reactive to eye opening and closure. Present in the anterior head region is a 15-20 Hz beta activity. Photic stimulation was performed, did not show any abnormalities. Hyperventilation was not performed. Drowsiness was not seen. No abnormal epileptiform discharges seen during this recording. There was no focal slowing. There were no electrographic seizure identified.   Abnormality: None   Impression: This is a normal awake EEG. No evidence of interictal epileptiform discharges. Normal EEGs, however, do not rule out epilepsy.    Melquisedec Journey, MD [facility]

## 2023-10-16 ENCOUNTER — Ambulatory Visit: Payer: Self-pay | Admitting: Neurology

## 2023-10-16 ENCOUNTER — Other Ambulatory Visit: Payer: Self-pay | Admitting: Family Medicine

## 2023-10-16 MED ORDER — CLONAZEPAM 0.5 MG PO TABS: 0.2500 mg | ORAL_TABLET | Freq: Every day | ORAL | 5 refills | Status: DC

## 2023-10-16 NOTE — Telephone Encounter (Signed)
 Your are work in provider this pm ( Dr.Sater patient)  Last seen on 07/30/23  Follow up scheduled on 03/04/24  clonazepam  0.5 mg tablet 07/07/2023 30 15 each Lomax, Amy, NP [employer] [facility]...   Rx pending to be signed

## 2023-10-16 NOTE — Telephone Encounter (Signed)
 Pt called to request Medication refill.Pt  was instructed by Pharmacy to reach out to MD    clonazePAM  (KLONOPIN ) 0.5 MG tablet  Pt would like medication to be sent to:   [facility] [place], [place] - [address] (Ph: [phone])

## 2023-10-20 NOTE — Telephone Encounter (Signed)
 EEG normal, pt Continues to feel lethargic & dizzy. What do you advise to address her symptoms further?

## 2023-10-21 ENCOUNTER — Encounter: Payer: Self-pay | Admitting: Internal Medicine

## 2023-10-21 ENCOUNTER — Ambulatory Visit: Attending: Internal Medicine | Admitting: Internal Medicine

## 2023-10-21 VITALS — BP 126/72 | HR 88 | Ht 66.0 in | Wt 126.2 lb

## 2023-10-21 DIAGNOSIS — R55 Syncope and collapse: Secondary | ICD-10-CM

## 2023-10-21 NOTE — Progress Notes (Signed)
 HPI Mrs. Vogan returns today for followup. She is a pleasant 83 yo woman with a h/o HTN, and syncope and malaise who presents today for worsening symptoms. she has had problems with feeling bad, including vertigo, palpitations and sob. She has also had non-cardiac chest pain. The patient was in her usual state of health until a month ago. She had the above symptoms. Went to the ED twice. Workup was unremarkable. A 2D echo showed normal LV function and normal valve function. Tele was unremarkable as were cardiac enzymes. She has worn a cardiac monitor which demonstrated symptomatic PAC's and brief episodes of NS AT. No pauses. She passes out for less than a minute with minimal prodrome. No loss of continence or tongue biting. No associated nausea or vomiting and on awakening she is back to normal.  Allergies  Allergen Reactions   Lidocaine Rash and Other (See Comments)    Affected the blood pressure- almost passed out also   Gabapentin Nausea Only    Stomach pain   Novocain [Procaine] Other (See Comments)    An injection at the dentist's caused salivation for 90+ days afterwards.     Current Outpatient Medications  Medication Sig Dispense Refill   albuterol (VENTOLIN HFA) 108 (90 Base) MCG/ACT inhaler Inhale 2 puffs into the lungs every 4 (four) hours as needed for wheezing or shortness of breath.     aspirin  EC 81 MG tablet Take 81 mg by mouth daily.     CALCIUM  PO Take 1 tablet by mouth daily after breakfast.     Cholecalciferol (VITAMIN D3 PO) Take 1 capsule by mouth daily after breakfast.     clonazePAM  (KLONOPIN ) 0.5 MG tablet Take 0.5 tablets (0.25 mg total) by mouth at bedtime. 15 tablet 5   Cyanocobalamin  (VITAMIN B12 PO) Take 1 tablet by mouth daily after breakfast.     donepezil  (ARICEPT ) 10 MG tablet Take 1 tablet (10 mg total) by mouth daily. 90 tablet 3   ferrous sulfate 324 MG TBEC Take 324 mg by mouth every other day.     hydrALAZINE  (APRESOLINE ) 25 MG tablet Take 1  tablet (25 mg total) by mouth 2 (two) times daily as needed for up to 30 doses (give hydralazine  25 mg to take as needed for SBP over 150). 30 tablet 0   ipratropium (ATROVENT) 0.06 % nasal spray Place 2 sprays into the nose daily as needed for rhinitis.     MAGNESIUM PO Take 1 tablet by mouth daily after breakfast.     meclizine  (ANTIVERT ) 25 MG tablet Take 0.5 tablets (12.5 mg total) by mouth 3 (three) times daily as needed for dizziness. 15 tablet 0   omeprazole (PRILOSEC) 20 MG capsule Take 20 mg by mouth every morning.     rosuvastatin  (CRESTOR ) 10 MG tablet Take 10 mg by mouth daily.     SUMAtriptan  (IMITREX ) 25 MG tablet Take 1 tablet (25 mg total) by mouth every 2 (two) hours as needed for migraine. May repeat in 2 hours if headache persists or recurs. 10 tablet 0   TYLENOL  500 MG tablet Take 500 mg by mouth every 6 (six) hours as needed (for headaches).     metoprolol  tartrate (LOPRESSOR ) 25 MG tablet Take 1 tablet (25 mg total) by mouth once for 1 dose. Take 2 hours prior to coronary CTA scan. (Patient not taking: Reported on 10/21/2023) 1 tablet 0   No current facility-administered medications for this visit.     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   Past Surgical History:  Procedure Laterality Date   CARPAL TUNNEL RELEASE Right    CATARACT EXTRACTION, BILATERAL Bilateral      Family History  Problem Relation Age of Onset   Diabetes Mother    Heart disease Mother    Cancer Mother        type unknown   Bone cancer Father    Diabetes Sister    Lung disease Sister    COPD Sister    Heart attack Paternal Grandfather    Down syndrome Daughter    Dementia Daughter    Other Daughter        colon surgery     Social History   Socioeconomic History   Marital status: Divorced    Spouse name: Not on file   Number of children: 3   Years of education: 16   Highest education level: Not on file  Occupational History   Occupation: retired  Tobacco Use   Smoking status: Every Day    Current packs/day: 0.50    Types: Cigarettes    Passive exposure: Never   Smokeless tobacco: Never  Vaping Use   Vaping status: Never Used  Substance and Sexual Activity   Alcohol  use: Yes    Alcohol /week: 5.0 standard drinks of alcohol     Types: 5 Glasses of wine per week    Comment: 1 glass of wine nightly   Drug use: Yes    Types: Other-see comments    Comment: CBD oil at night on occasion   Sexual activity: Not on file  Other Topics Concern   Not on file  Social History Narrative   Drinks 3-4 cups of caffeine daily.   Social Drivers of Corporate investment banker Strain: Not on file  Food Insecurity: No Food Insecurity (07/03/2023)   Hunger Vital Sign    Worried About Running Out of Food in the Last Year: Never true    Ran Out of Food in the Last Year: Never true  Transportation Needs: No Transportation Needs (07/03/2023)   PRAPARE - Administrator, Civil Service (Medical): No    Lack of  Transportation (Non-Medical): No  Physical Activity: Not on file  Stress: Not on file  Social Connections: Moderately Integrated (07/03/2023)   Social Connection and Isolation Panel    Frequency of Communication with Friends and Family: Twice a week    Frequency of Social Gatherings with Friends  and Family: Once a week    Attends Religious Services: 1 to 4 times per year    Active Member of Clubs or Organizations: Yes    Attends Banker Meetings: 1 to 4 times per year    Marital Status: Divorced  Intimate Partner Violence: Not At Risk (07/03/2023)   Humiliation, Afraid, Rape, and Kick questionnaire    Fear of Current or Ex-Partner: No    Emotionally Abused: No    Physically Abused: No    Sexually Abused: No     BP 126/72   Pulse 88   Ht 5' 6 (1.676 m)   Wt 126 lb 3.2 oz (57.2 kg)   SpO2 96%   BMI 20.37 kg/m   Physical Exam:  Well appearing NAD HEENT: Unremarkable Neck:  No JVD, no thyromegally Lymphatics:  No adenopathy Back:  No CVA tenderness Lungs:  Clear HEART:  Regular rate rhythm, no murmurs, no rubs, no clicks Abd:  soft, positive bowel sounds, no organomegally, no rebound, no guarding Ext:  2 plus pulses, no edema, no cyanosis, no clubbing Skin:  No rashes no nodules Neuro:  CN II through XII intact, motor grossly intact  Assess/Plan:  Fatigue/malaise/atypical chest pain - the etiology is unclear. She had an abnormal lambda light chain on her Myeloma pattern of uncertain significance. Also her cortisol was high! I''ll defer work up of the abnormal myeloma labs to her primary care. HTN - her bp fluctuates and she has been prescribed hydralazine  for sbp about 150 which I agree with. We discussed the importance of not missing any meals. Hypotension - review of her bp's shows occaisions of low bp. I encouraged her not to miss any meals and she can increase the sodium if her bp runs low.  Elevated lambda light chains - this is of uncertain significance as  is her H/H not too bad. I'll defer heme referral to her primary. Same with cortisol elevation.  Syncope - if nothing plays out with above, I have offered her an ILR. No urgency to this now but this would at least rule out tachy and brady. Her symptoms are not consistent with autonomic dysfunction.    Danelle Kenner Lewan,MD

## 2023-10-26 DIAGNOSIS — R55 Syncope and collapse: Secondary | ICD-10-CM | POA: Diagnosis not present

## 2023-10-27 ENCOUNTER — Emergency Department (HOSPITAL_COMMUNITY)

## 2023-10-27 ENCOUNTER — Emergency Department (HOSPITAL_COMMUNITY): Admission: EM | Admit: 2023-10-27 | Discharge: 2023-10-28 | Disposition: A | Discharge status: Home / Self Care

## 2023-10-27 ENCOUNTER — Encounter (HOSPITAL_COMMUNITY): Payer: Self-pay

## 2023-10-27 ENCOUNTER — Other Ambulatory Visit: Payer: Self-pay

## 2023-10-27 DIAGNOSIS — K6289 Other specified diseases of anus and rectum: Secondary | ICD-10-CM | POA: Insufficient documentation

## 2023-10-27 DIAGNOSIS — K760 Fatty (change of) liver, not elsewhere classified: Secondary | ICD-10-CM | POA: Diagnosis not present

## 2023-10-27 DIAGNOSIS — M47816 Spondylosis without myelopathy or radiculopathy, lumbar region: Secondary | ICD-10-CM | POA: Diagnosis not present

## 2023-10-27 DIAGNOSIS — D259 Leiomyoma of uterus, unspecified: Secondary | ICD-10-CM | POA: Diagnosis not present

## 2023-10-27 DIAGNOSIS — M47817 Spondylosis without myelopathy or radiculopathy, lumbosacral region: Secondary | ICD-10-CM | POA: Diagnosis not present

## 2023-10-27 DIAGNOSIS — I10 Essential (primary) hypertension: Secondary | ICD-10-CM | POA: Diagnosis not present

## 2023-10-27 DIAGNOSIS — Z7982 Long term (current) use of aspirin: Secondary | ICD-10-CM | POA: Insufficient documentation

## 2023-10-27 DIAGNOSIS — M545 Low back pain, unspecified: Secondary | ICD-10-CM | POA: Diagnosis present

## 2023-10-27 DIAGNOSIS — K5730 Diverticulosis of large intestine without perforation or abscess without bleeding: Secondary | ICD-10-CM | POA: Diagnosis not present

## 2023-10-27 DIAGNOSIS — M4316 Spondylolisthesis, lumbar region: Secondary | ICD-10-CM | POA: Diagnosis not present

## 2023-10-27 DIAGNOSIS — Z79899 Other long term (current) drug therapy: Secondary | ICD-10-CM | POA: Diagnosis not present

## 2023-10-27 DIAGNOSIS — M549 Dorsalgia, unspecified: Secondary | ICD-10-CM

## 2023-10-27 LAB — URINALYSIS, ROUTINE W REFLEX MICROSCOPIC
Bacteria, UA: NONE SEEN
Bilirubin Urine: NEGATIVE
Glucose, UA: NEGATIVE mg/dL
Ketones, ur: NEGATIVE mg/dL
Leukocytes,Ua: NEGATIVE
Nitrite: NEGATIVE
Protein, ur: NEGATIVE mg/dL
Specific Gravity, Urine: >1.046 — ABNORMAL HIGH (ref 1.005–1.030)
pH: 6 (ref 5.0–8.0)

## 2023-10-27 LAB — COMPREHENSIVE METABOLIC PANEL WITH GFR
ALT: 19 U/L (ref 0–44)
AST: 22 U/L (ref 15–41)
Albumin: 3.7 g/dL (ref 3.5–5.0)
Alkaline Phosphatase: 52 U/L (ref 38–126)
Anion gap: 10 (ref 5–15)
BUN: 9 mg/dL (ref 8–23)
CO2: 26 mmol/L (ref 22–32)
Calcium: 9 mg/dL (ref 8.9–10.3)
Chloride: 98 mmol/L (ref 98–111)
Creatinine, Ser: 1.06 mg/dL — ABNORMAL HIGH (ref 0.44–1.00)
GFR, Estimated: 52 mL/min — ABNORMAL LOW (ref >60)
Glucose, Bld: 119 mg/dL — ABNORMAL HIGH (ref 70–99)
Potassium: 4.6 mmol/L (ref 3.5–5.1)
Sodium: 134 mmol/L — ABNORMAL LOW (ref 135–145)
Total Bilirubin: 0.8 mg/dL (ref 0.0–1.2)
Total Protein: 6.3 g/dL — ABNORMAL LOW (ref 6.5–8.1)

## 2023-10-27 LAB — CBC
HCT: 40.2 % (ref 36.0–46.0)
Hemoglobin: 13.7 g/dL (ref 12.0–15.0)
MCH: 33.4 pg (ref 26.0–34.0)
MCHC: 34.1 g/dL (ref 30.0–36.0)
MCV: 98 fL (ref 80.0–100.0)
Platelets: 265 K/uL (ref 150–400)
RBC: 4.1 MIL/uL (ref 3.87–5.11)
RDW: 12.4 % (ref 11.5–15.5)
WBC: 5.8 K/uL (ref 4.0–10.5)
nRBC: 0 % (ref 0.0–0.2)

## 2023-10-27 MED ORDER — IOHEXOL 300 MG/ML  SOLN
75.0000 mL | Freq: Once | INTRAMUSCULAR | Status: AC | PRN
  Administered 2023-10-27: 75 mL via INTRAVENOUS

## 2023-10-27 MED ORDER — LIDOCAINE 5 % EX PTCH
1.0000 | MEDICATED_PATCH | Freq: Once | CUTANEOUS | Status: DC
  Administered 2023-10-27: 1 via TRANSDERMAL
  Filled 2023-10-27: qty 1

## 2023-10-27 MED ORDER — LIDOCAINE 5 % EX PTCH
1.0000 | MEDICATED_PATCH | CUTANEOUS | 0 refills | Status: DC
Start: 2023-10-27 — End: 2024-02-06

## 2023-10-27 MED ORDER — ACETAMINOPHEN 500 MG PO TABS
1000.0000 mg | ORAL_TABLET | Freq: Once | ORAL | Status: AC
  Administered 2023-10-27: 1000 mg via ORAL
  Filled 2023-10-27: qty 2

## 2023-10-27 MED ORDER — MIDAZOLAM HCL 2 MG/2ML IJ SOLN
0.5000 mg | Freq: Once | INTRAMUSCULAR | Status: AC
  Administered 2023-10-27: 0.5 mg via INTRAVENOUS
  Filled 2023-10-27: qty 2

## 2023-10-27 NOTE — ED Triage Notes (Signed)
 Pt c/o urinary and fecal incontinence, fatigue, diarrhea x several days; denies dysuria, denies fevers  Pt gives verbal consent for mse

## 2023-10-27 NOTE — ED Triage Notes (Signed)
 Pt to er, pt states that she has some peri anal pain and incontinent.  States that she has been having the pain since last week, and the incontinent episodes for the past couple of days.  States that she is also having some dizziness.

## 2023-10-27 NOTE — ED Provider Notes (Signed)
 [place] EMERGENCY DEPARTMENT AT [facility] Provider Note   CSN: [id number] Arrival date & time: 10/27/23  1307     Patient presents with: Rectal Pain   Kara Tapia is a 83 y.o. female.   Is an 83 year old female presenting emergency department with low back pain x 1 week.  Primarily on right side radiates down  below right knee.  Had some paresthesia in L5 dermatome.  No weakness.  No history of back pain.  No trauma.  She does note that she has had some bowel incontinence while sleeping for the past couple nights.  No abdominal pain no nausea vomiting.  She has also had some intermittent urinary incontinence.  Denies saddle anesthesia.         Prior to Admission medications   Medication Sig Start Date End Date Taking? Authorizing Provider  albuterol (VENTOLIN HFA) 108 (90 Base) MCG/ACT inhaler Inhale 2 puffs into the lungs every 4 (four) hours as needed for wheezing or shortness of breath. 05/08/23   [provider]  aspirin  EC 81 MG tablet Take 81 mg by mouth daily. 07/04/23   [provider]  CALCIUM  PO Take 1 tablet by mouth daily after breakfast.    [provider]  Cholecalciferol (VITAMIN D3 PO) Take 1 capsule by mouth daily after breakfast.    [provider]  clonazePAM  (KLONOPIN ) 0.5 MG tablet Take 0.5 tablets (0.25 mg total) by mouth at bedtime. 10/16/23   Penumalli, Vikram R, MD  Cyanocobalamin  (VITAMIN B12 PO) Take 1 tablet by mouth daily after breakfast.    [provider]  donepezil  (ARICEPT ) 10 MG tablet Take 1 tablet (10 mg total) by mouth daily. 04/10/23   Lomax, Amy, NP  ferrous sulfate 324 MG TBEC Take 324 mg by mouth every other day.    [provider]  hydrALAZINE  (APRESOLINE ) 25 MG tablet Take 1 tablet (25 mg total) by mouth 2 (two) times daily as needed for up to 30 doses (give hydralazine  25 mg to take as needed for SBP over 150). 07/04/23   Christobal Guadalajara, MD  ipratropium (ATROVENT) 0.06 % nasal  spray Place 2 sprays into the nose daily as needed for rhinitis. 08/07/23   [provider]  MAGNESIUM PO Take 1 tablet by mouth daily after breakfast.    [provider]  meclizine  (ANTIVERT ) 25 MG tablet Take 0.5 tablets (12.5 mg total) by mouth 3 (three) times daily as needed for dizziness. 07/04/23   Christobal Guadalajara, MD  metoprolol  tartrate (LOPRESSOR ) 25 MG tablet Take 1 tablet (25 mg total) by mouth once for 1 dose. Take 2 hours prior to coronary CTA scan. Patient not taking: Reported on 10/21/2023 07/04/23 10/21/23  Goodrich, Callie E, PA-C  omeprazole (PRILOSEC) 20 MG capsule Take 20 mg by mouth every morning. 07/10/23   [provider]  rosuvastatin  (CRESTOR ) 10 MG tablet Take 10 mg by mouth daily. 12/29/22   [provider]  SUMAtriptan  (IMITREX ) 25 MG tablet Take 1 tablet (25 mg total) by mouth every 2 (two) hours as needed for migraine. May repeat in 2 hours if headache persists or recurs. 05/29/23   Dohmeier, Dedra, MD  TYLENOL  500 MG tablet Take 500 mg by mouth every 6 (six) hours as needed (for headaches).    [provider]    Allergies: Lidocaine , Gabapentin, and Novocain [procaine]    Review of Systems  Updated Vital Signs BP (!) 141/77   Pulse (!) 59   Temp (!)  97.5 F (36.4 C) (Oral)   Resp 16   SpO2 99%   Physical Exam Vitals and nursing note reviewed.  Constitutional:      General: She is not in acute distress.    Appearance: She is not toxic-appearing.  HENT:     Head: Normocephalic.     Mouth/Throat:     Mouth: Mucous membranes are moist.  Eyes:     Conjunctiva/sclera: Conjunctivae normal.  Cardiovascular:     Rate and Rhythm: Normal rate and regular rhythm.     Pulses: Normal pulses.  Pulmonary:     Effort: Pulmonary effort is normal.     Breath sounds: Normal breath sounds.  Abdominal:     General: Abdomen is flat. There is no distension.     Palpations: Abdomen is soft.     Tenderness: There is no abdominal  tenderness. There is no guarding or rebound.  Musculoskeletal:     Comments: 5-5 plantarflexion, dorsiflexion.  Normal sensation.  5 out of 5 hip extension.  Positive straight leg test on the right.  No edema.  Soft compartments.  Warm and well-perfused extremities.  No midline spinal tenderness, does have some tenderness to right SI joint.  Skin:    General: Skin is warm and dry.     Capillary Refill: Capillary refill takes less than 2 seconds.  Neurological:     Mental Status: She is alert and oriented to person, place, and time.  Psychiatric:        Mood and Affect: Mood normal.        Behavior: Behavior normal.     (all labs ordered are listed, but only abnormal results are displayed) Labs Reviewed  COMPREHENSIVE METABOLIC PANEL WITH GFR - Abnormal; Notable for the following components:      Result Value   Sodium 134 (*)    Glucose, Bld 119 (*)    Creatinine, Ser 1.06 (*)    Total Protein 6.3 (*)    GFR, Estimated 52 (*)    All other components within normal limits  URINALYSIS, ROUTINE W REFLEX MICROSCOPIC - Abnormal; Notable for the following components:   Specific Gravity, Urine >1.046 (*)    Hgb urine dipstick SMALL (*)    All other components within normal limits  CBC    EKG: EKG Interpretation Date/Time:  Monday October 27 2023 13:29:02 EDT Ventricular Rate:  79 PR Interval:  180 QRS Duration:  80 QT Interval:  386 QTC Calculation: 442 R Axis:   20  Text Interpretation: Sinus rhythm with marked sinus arrhythmia Otherwise normal ECG When compared with ECG of 02-Jul-2023 13:17, PREVIOUS ECG IS PRESENT Confirmed by Neysa Clap [phone]) on 10/27/2023 4:15:43 PM  Radiology: MR LUMBAR SPINE WO CONTRAST Result Date: 10/27/2023 EXAM: MRI LUMBAR SPINE 10/27/2023 06:58:13 PM TECHNIQUE: Multiplanar multisequence MRI of the lumbar spine was performed without the administration of intravenous contrast. COMPARISON: 08/31/2008 CLINICAL HISTORY: Low back pain, cauda equina syndrome  suspected. FINDINGS: BONES AND ALIGNMENT: Grade 1 anterolisthesis at L4-5. SPINAL CORD: The conus terminates normally. SOFT TISSUES: No paraspinal mass. L1-L2: Small disc bulge. No stenosis. L2-L3: Small disc bulge. No stenosis. L3-L4: Intermediate sized disc bulge with left greater than right lateral recess stenosis and mild left foraminal stenosis. L4-L5: Severe facet arthrosis with grade 1 anterolisthesis and disc uncovering. Severe spinal canal stenosis. L5-S1: Moderate facet arthrosis without stenosis. IMPRESSION: 1. L3-4: Intermediate sized disc bulge with left greater than right lateral recess stenosis and mild left foraminal stenosis. 2. L4-5: Severe  facet arthrosis with grade 1 anterolisthesis and disc uncovering, resulting in mildly progressed severe spinal canal stenosis. 3. L5-S1: Moderate facet arthrosis without stenosis. Electronically signed by: Franky Stanford MD 10/27/2023 09:21 PM EDT RP Workstation: [id number]   CT ABDOMEN PELVIS W CONTRAST Result Date: 10/27/2023 EXAM: CT ABDOMEN AND PELVIS WITH CONTRAST 10/27/2023 07:29:00 PM TECHNIQUE: CT of the abdomen and pelvis was performed with the administration of intravenous contrast. Multiplanar reformatted images are provided for review. Automated exposure control, iterative reconstruction, and/or weight based adjustment of the mA/kV was utilized to reduce the radiation dose to as low as reasonably achievable. COMPARISON: 02/12/16. CLINICAL HISTORY: Bowel obstruction suspected. Pt to er, pt states that she has some peri anal pain and incontinent. States that she has been having the pain since last week, and the incontinent episodes for the past couple of days. States that she is also having some dizziness. FINDINGS: LOWER CHEST: No acute abnormality. LIVER: Hepatic steatosis. GALLBLADDER AND BILE DUCTS: Gallbladder is unremarkable. No biliary ductal dilatation. SPLEEN: No acute abnormality. PANCREAS: No acute abnormality. ADRENAL GLANDS: No acute  abnormality. KIDNEYS, URETERS AND BLADDER: No stones in the kidneys or ureters. No hydronephrosis. No perinephric or periureteral stranding. Urinary bladder is unremarkable. GI AND BOWEL: Extensive colonic diverticulosis without diverticulitis. Normal appendix. No bowel obstruction. No bowel wall thickening. PERITONEUM AND RETROPERITONEUM: No ascites. No free air. VASCULATURE: Advanced aortic atherosclerosis. LYMPH NODES: No lymphadenopathy. REPRODUCTIVE ORGANS: Calcified uterine fibroids. BONES AND SOFT TISSUES: No acute osseous abnormality. No focal soft tissue abnormality. IMPRESSION: 1. No bowel obstruction. 2. Extensive colonic diverticulosis without diverticulitis. Electronically signed by: Norman Gatlin MD 10/27/2023 08:20 PM EDT RP Workstation: [id number]     Procedures   Medications Ordered in the ED  lidocaine  (LIDODERM ) 5 % 1 patch (1 patch Transdermal Patch Applied 10/27/23 2203)  acetaminophen  (TYLENOL ) tablet 1,000 mg (1,000 mg Oral Given 10/27/23 1924)  midazolam  (VERSED ) injection 0.5 mg (0.5 mg Intravenous Given 10/27/23 1809)  iohexol  (OMNIPAQUE ) 300 MG/ML solution 75 mL (75 mLs Intravenous Contrast Given 10/27/23 1929)    Clinical Course as of 10/27/23 2336  Mon Oct 27, 2023  2041 CT ABDOMEN PELVIS W CONTRAST IMPRESSION: 1. No bowel obstruction. 2. Extensive colonic diverticulosis without diverticulitis.  Electronically signed by: Norman Gatlin MD 10/27/2023 08:20 PM EDT RP Workstation: [id number]   [TY]  2153 Spoke with Dr. Gillie, NSGY regarding MRI findings and patient's presentation.  Did not need intervention currently and can follow-up outpatient.   [TY]    Clinical Course User Index [TY] Neysa Caron PARAS, DO                                 Medical Decision Making This is an 83 year old female presenting emergency department with low back pain.  She is afebrile, nontachycardic, slightly hypertensive.  Physical exam is quite well-appearing.  She is ambulatory with  steady gait.  No focal findings on exam.  However she is having new onset back pain with some incontinence episodes.  MRI to evaluate for possible cord compression, however showed some stenosis at L4-L5.  No cauda equina or conus medullaris.  Did discuss with Dr. Gillie with neurosurgery.  See ED course.  She was complaining of some rectal pressure/discomfort.  Does not appear to be overtly constipated or having fecal impaction.  Her labs with no leukocytosis to suggest infectious process comprehensive panel with minor low sodium, but otherwise reassuring.  No transaminitis suggest  hepatobiliary disease.  UA without evidence of urinary tract infection.  Unclear etiology for patient's incontinence episode.  Discussed follow-up with primary doctor for further workup.  She is agreeable to plan.  Amount and/or Complexity of Data Reviewed Independent Historian:     Details: Daughter noted has been worked up in the past with neuro, cardiology for her vertigo/dizziness External Data Reviewed:     Details: Has had negative MRIs of the head and C-spine Labs: ordered. Decision-making details documented in ED Course. Radiology: ordered. Decision-making details documented in ED Course.  Risk OTC drugs. Prescription drug management. Decision regarding hospitalization. Diagnosis or treatment significantly limited by social determinants of health.      Final diagnoses:  None    ED Discharge Orders     None          Neysa Caron PARAS, DO 10/27/23 2336

## 2023-10-27 NOTE — Discharge Instructions (Addendum)
 You do not have a urinary tract infection. He may take over-the-counter medication such as Tylenol  and ibuprofen for pain.  I prescribed lidocaine  patches.  Please follow-up with your primary doctor for further pain management options and further workup for your symptoms today.  Return if develop fevers, chills, severe headache, chest pain, shortness of breath, palpitations, abdominal pain, uncontrolled nausea vomiting or any new or worsening symptoms that are concerning to you.

## 2023-11-06 DIAGNOSIS — M549 Dorsalgia, unspecified: Secondary | ICD-10-CM | POA: Diagnosis not present

## 2023-11-06 DIAGNOSIS — M48062 Spinal stenosis, lumbar region with neurogenic claudication: Secondary | ICD-10-CM | POA: Diagnosis not present

## 2023-11-06 DIAGNOSIS — M5416 Radiculopathy, lumbar region: Secondary | ICD-10-CM | POA: Diagnosis not present

## 2023-11-06 DIAGNOSIS — M4316 Spondylolisthesis, lumbar region: Secondary | ICD-10-CM | POA: Diagnosis not present

## 2023-11-09 ENCOUNTER — Ambulatory Visit: Payer: Self-pay | Admitting: Internal Medicine

## 2023-11-10 DIAGNOSIS — R7989 Other specified abnormal findings of blood chemistry: Secondary | ICD-10-CM | POA: Diagnosis not present

## 2023-11-10 DIAGNOSIS — M5416 Radiculopathy, lumbar region: Secondary | ICD-10-CM | POA: Diagnosis not present

## 2023-11-13 DIAGNOSIS — M48062 Spinal stenosis, lumbar region with neurogenic claudication: Secondary | ICD-10-CM | POA: Diagnosis not present

## 2023-11-13 DIAGNOSIS — M4310 Spondylolisthesis, site unspecified: Secondary | ICD-10-CM | POA: Diagnosis not present

## 2023-11-25 ENCOUNTER — Inpatient Hospital Stay (HOSPITAL_COMMUNITY)
Admission: EM | Admit: 2023-11-25 | Discharge: 2023-11-27 | DRG: 261 | Disposition: A | Discharge status: Home Health | Attending: Internal Medicine | Admitting: Internal Medicine

## 2023-11-25 ENCOUNTER — Other Ambulatory Visit: Payer: Self-pay

## 2023-11-25 DIAGNOSIS — I639 Cerebral infarction, unspecified: Secondary | ICD-10-CM | POA: Diagnosis not present

## 2023-11-25 DIAGNOSIS — Z79899 Other long term (current) drug therapy: Secondary | ICD-10-CM

## 2023-11-25 DIAGNOSIS — M48061 Spinal stenosis, lumbar region without neurogenic claudication: Secondary | ICD-10-CM | POA: Diagnosis present

## 2023-11-25 DIAGNOSIS — M5030 Other cervical disc degeneration, unspecified cervical region: Secondary | ICD-10-CM | POA: Diagnosis not present

## 2023-11-25 DIAGNOSIS — R9089 Other abnormal findings on diagnostic imaging of central nervous system: Secondary | ICD-10-CM | POA: Diagnosis not present

## 2023-11-25 DIAGNOSIS — Z634 Disappearance and death of family member: Secondary | ICD-10-CM | POA: Diagnosis not present

## 2023-11-25 DIAGNOSIS — E860 Dehydration: Secondary | ICD-10-CM | POA: Diagnosis not present

## 2023-11-25 DIAGNOSIS — Z7982 Long term (current) use of aspirin: Secondary | ICD-10-CM

## 2023-11-25 DIAGNOSIS — E785 Hyperlipidemia, unspecified: Secondary | ICD-10-CM | POA: Diagnosis present

## 2023-11-25 DIAGNOSIS — R1310 Dysphagia, unspecified: Secondary | ICD-10-CM | POA: Diagnosis not present

## 2023-11-25 DIAGNOSIS — Z8249 Family history of ischemic heart disease and other diseases of the circulatory system: Secondary | ICD-10-CM | POA: Diagnosis not present

## 2023-11-25 DIAGNOSIS — I739 Peripheral vascular disease, unspecified: Secondary | ICD-10-CM | POA: Diagnosis not present

## 2023-11-25 DIAGNOSIS — Z9842 Cataract extraction status, left eye: Secondary | ICD-10-CM

## 2023-11-25 DIAGNOSIS — Z66 Do not resuscitate: Secondary | ICD-10-CM | POA: Diagnosis not present

## 2023-11-25 DIAGNOSIS — G8929 Other chronic pain: Secondary | ICD-10-CM | POA: Diagnosis not present

## 2023-11-25 DIAGNOSIS — S3992XA Unspecified injury of lower back, initial encounter: Secondary | ICD-10-CM | POA: Diagnosis not present

## 2023-11-25 DIAGNOSIS — G4700 Insomnia, unspecified: Secondary | ICD-10-CM | POA: Diagnosis present

## 2023-11-25 DIAGNOSIS — Z85828 Personal history of other malignant neoplasm of skin: Secondary | ICD-10-CM

## 2023-11-25 DIAGNOSIS — W19XXXA Unspecified fall, initial encounter: Secondary | ICD-10-CM | POA: Diagnosis present

## 2023-11-25 DIAGNOSIS — M4726 Other spondylosis with radiculopathy, lumbar region: Secondary | ICD-10-CM | POA: Diagnosis not present

## 2023-11-25 DIAGNOSIS — Z884 Allergy status to anesthetic agent status: Secondary | ICD-10-CM

## 2023-11-25 DIAGNOSIS — R202 Paresthesia of skin: Secondary | ICD-10-CM | POA: Diagnosis not present

## 2023-11-25 DIAGNOSIS — I10 Essential (primary) hypertension: Secondary | ICD-10-CM | POA: Diagnosis not present

## 2023-11-25 DIAGNOSIS — R55 Syncope and collapse: Principal | ICD-10-CM | POA: Diagnosis present

## 2023-11-25 DIAGNOSIS — F17210 Nicotine dependence, cigarettes, uncomplicated: Secondary | ICD-10-CM | POA: Diagnosis present

## 2023-11-25 DIAGNOSIS — Z9841 Cataract extraction status, right eye: Secondary | ICD-10-CM

## 2023-11-25 DIAGNOSIS — Z825 Family history of asthma and other chronic lower respiratory diseases: Secondary | ICD-10-CM

## 2023-11-25 DIAGNOSIS — M5416 Radiculopathy, lumbar region: Secondary | ICD-10-CM | POA: Diagnosis not present

## 2023-11-25 DIAGNOSIS — E871 Hypo-osmolality and hyponatremia: Secondary | ICD-10-CM | POA: Diagnosis present

## 2023-11-25 DIAGNOSIS — Z8279 Family history of other congenital malformations, deformations and chromosomal abnormalities: Secondary | ICD-10-CM

## 2023-11-25 DIAGNOSIS — S12300A Unspecified displaced fracture of fourth cervical vertebra, initial encounter for closed fracture: Secondary | ICD-10-CM | POA: Diagnosis not present

## 2023-11-25 DIAGNOSIS — Z833 Family history of diabetes mellitus: Secondary | ICD-10-CM | POA: Diagnosis not present

## 2023-11-25 DIAGNOSIS — R29818 Other symptoms and signs involving the nervous system: Secondary | ICD-10-CM | POA: Diagnosis not present

## 2023-11-25 DIAGNOSIS — R419 Unspecified symptoms and signs involving cognitive functions and awareness: Secondary | ICD-10-CM | POA: Diagnosis present

## 2023-11-25 DIAGNOSIS — Z888 Allergy status to other drugs, medicaments and biological substances status: Secondary | ICD-10-CM

## 2023-11-25 DIAGNOSIS — M47812 Spondylosis without myelopathy or radiculopathy, cervical region: Secondary | ICD-10-CM | POA: Diagnosis not present

## 2023-11-25 LAB — COMPREHENSIVE METABOLIC PANEL WITH GFR
ALT: 28 U/L (ref 0–44)
AST: 39 U/L (ref 15–41)
Albumin: 3.5 g/dL (ref 3.5–5.0)
Alkaline Phosphatase: 54 U/L (ref 38–126)
Anion gap: 9 (ref 5–15)
BUN: 14 mg/dL (ref 8–23)
CO2: 23 mmol/L (ref 22–32)
Calcium: 9 mg/dL (ref 8.9–10.3)
Chloride: 97 mmol/L — ABNORMAL LOW (ref 98–111)
Creatinine, Ser: 0.89 mg/dL (ref 0.44–1.00)
GFR, Estimated: >60 mL/min (ref >60)
Glucose, Bld: 107 mg/dL — ABNORMAL HIGH (ref 70–99)
Potassium: 3.6 mmol/L (ref 3.5–5.1)
Sodium: 129 mmol/L — ABNORMAL LOW (ref 135–145)
Total Bilirubin: 0.3 mg/dL (ref 0.0–1.2)
Total Protein: 5.8 g/dL — ABNORMAL LOW (ref 6.5–8.1)

## 2023-11-25 LAB — CBC
HCT: 37.5 % (ref 36.0–46.0)
Hemoglobin: 12.8 g/dL (ref 12.0–15.0)
MCH: 34.1 pg — ABNORMAL HIGH (ref 26.0–34.0)
MCHC: 34.1 g/dL (ref 30.0–36.0)
MCV: 100 fL (ref 80.0–100.0)
Platelets: 213 K/uL (ref 150–400)
RBC: 3.75 MIL/uL — ABNORMAL LOW (ref 3.87–5.11)
RDW: 13.1 % (ref 11.5–15.5)
WBC: 7.4 K/uL (ref 4.0–10.5)
nRBC: 0 % (ref 0.0–0.2)

## 2023-11-25 LAB — I-STAT CHEM 8, ED
BUN: 15 mg/dL (ref 8–23)
Calcium, Ion: 1.15 mmol/L (ref 1.15–1.40)
Chloride: 96 mmol/L — ABNORMAL LOW (ref 98–111)
Creatinine, Ser: 1.1 mg/dL — ABNORMAL HIGH (ref 0.44–1.00)
Glucose, Bld: 105 mg/dL — ABNORMAL HIGH (ref 70–99)
HCT: 38 % (ref 36.0–46.0)
Hemoglobin: 12.9 g/dL (ref 12.0–15.0)
Potassium: 3.6 mmol/L (ref 3.5–5.1)
Sodium: 128 mmol/L — ABNORMAL LOW (ref 135–145)
TCO2: 22 mmol/L (ref 22–32)

## 2023-11-25 NOTE — ED Triage Notes (Signed)
 Pt has been having syncopal episodes for last couple of weeks. Has had about 7 in total. Pt remembers walking then waking up on the ground. Happened twice yesterday. May have hit head. No thinners. Denies feeling dizzy before syncopal episode. No CP SOB. Does have tingling down the back of both thighs but that is related to spinal issues she is being seen for. Pt A&OX4 during triage

## 2023-11-26 ENCOUNTER — Emergency Department (HOSPITAL_COMMUNITY)

## 2023-11-26 ENCOUNTER — Inpatient Hospital Stay (HOSPITAL_COMMUNITY)
Admission: EM | Disposition: A | Discharge status: Home Health | Payer: Self-pay | Source: Home / Self Care | Attending: Internal Medicine

## 2023-11-26 ENCOUNTER — Observation Stay (HOSPITAL_COMMUNITY)

## 2023-11-26 ENCOUNTER — Encounter (HOSPITAL_COMMUNITY): Payer: Self-pay | Admitting: Internal Medicine

## 2023-11-26 DIAGNOSIS — W19XXXA Unspecified fall, initial encounter: Secondary | ICD-10-CM | POA: Diagnosis present

## 2023-11-26 DIAGNOSIS — S3992XA Unspecified injury of lower back, initial encounter: Secondary | ICD-10-CM | POA: Diagnosis not present

## 2023-11-26 DIAGNOSIS — Z79899 Other long term (current) drug therapy: Secondary | ICD-10-CM | POA: Diagnosis not present

## 2023-11-26 DIAGNOSIS — Z833 Family history of diabetes mellitus: Secondary | ICD-10-CM | POA: Diagnosis not present

## 2023-11-26 DIAGNOSIS — M47812 Spondylosis without myelopathy or radiculopathy, cervical region: Secondary | ICD-10-CM | POA: Diagnosis not present

## 2023-11-26 DIAGNOSIS — Z66 Do not resuscitate: Secondary | ICD-10-CM | POA: Diagnosis present

## 2023-11-26 DIAGNOSIS — E871 Hypo-osmolality and hyponatremia: Secondary | ICD-10-CM | POA: Diagnosis present

## 2023-11-26 DIAGNOSIS — M5030 Other cervical disc degeneration, unspecified cervical region: Secondary | ICD-10-CM | POA: Diagnosis not present

## 2023-11-26 DIAGNOSIS — E860 Dehydration: Secondary | ICD-10-CM | POA: Diagnosis present

## 2023-11-26 DIAGNOSIS — R55 Syncope and collapse: Secondary | ICD-10-CM | POA: Diagnosis not present

## 2023-11-26 DIAGNOSIS — I739 Peripheral vascular disease, unspecified: Secondary | ICD-10-CM | POA: Diagnosis present

## 2023-11-26 DIAGNOSIS — F17210 Nicotine dependence, cigarettes, uncomplicated: Secondary | ICD-10-CM | POA: Diagnosis present

## 2023-11-26 DIAGNOSIS — Z9841 Cataract extraction status, right eye: Secondary | ICD-10-CM | POA: Diagnosis not present

## 2023-11-26 DIAGNOSIS — Z85828 Personal history of other malignant neoplasm of skin: Secondary | ICD-10-CM | POA: Diagnosis not present

## 2023-11-26 DIAGNOSIS — I639 Cerebral infarction, unspecified: Secondary | ICD-10-CM | POA: Diagnosis not present

## 2023-11-26 DIAGNOSIS — R1310 Dysphagia, unspecified: Secondary | ICD-10-CM | POA: Diagnosis present

## 2023-11-26 DIAGNOSIS — R202 Paresthesia of skin: Secondary | ICD-10-CM | POA: Diagnosis not present

## 2023-11-26 DIAGNOSIS — S12300A Unspecified displaced fracture of fourth cervical vertebra, initial encounter for closed fracture: Secondary | ICD-10-CM | POA: Diagnosis not present

## 2023-11-26 DIAGNOSIS — Z634 Disappearance and death of family member: Secondary | ICD-10-CM | POA: Diagnosis not present

## 2023-11-26 DIAGNOSIS — Z7982 Long term (current) use of aspirin: Secondary | ICD-10-CM | POA: Diagnosis not present

## 2023-11-26 DIAGNOSIS — I10 Essential (primary) hypertension: Secondary | ICD-10-CM | POA: Diagnosis present

## 2023-11-26 DIAGNOSIS — M48061 Spinal stenosis, lumbar region without neurogenic claudication: Secondary | ICD-10-CM | POA: Diagnosis present

## 2023-11-26 DIAGNOSIS — Z8279 Family history of other congenital malformations, deformations and chromosomal abnormalities: Secondary | ICD-10-CM | POA: Diagnosis not present

## 2023-11-26 DIAGNOSIS — M4726 Other spondylosis with radiculopathy, lumbar region: Secondary | ICD-10-CM | POA: Diagnosis not present

## 2023-11-26 DIAGNOSIS — R29818 Other symptoms and signs involving the nervous system: Secondary | ICD-10-CM | POA: Diagnosis not present

## 2023-11-26 DIAGNOSIS — G8929 Other chronic pain: Secondary | ICD-10-CM | POA: Diagnosis present

## 2023-11-26 DIAGNOSIS — R419 Unspecified symptoms and signs involving cognitive functions and awareness: Secondary | ICD-10-CM | POA: Diagnosis present

## 2023-11-26 DIAGNOSIS — Z8249 Family history of ischemic heart disease and other diseases of the circulatory system: Secondary | ICD-10-CM | POA: Diagnosis not present

## 2023-11-26 DIAGNOSIS — G4700 Insomnia, unspecified: Secondary | ICD-10-CM | POA: Diagnosis present

## 2023-11-26 DIAGNOSIS — E785 Hyperlipidemia, unspecified: Secondary | ICD-10-CM | POA: Diagnosis present

## 2023-11-26 DIAGNOSIS — R9089 Other abnormal findings on diagnostic imaging of central nervous system: Secondary | ICD-10-CM | POA: Diagnosis not present

## 2023-11-26 DIAGNOSIS — Z825 Family history of asthma and other chronic lower respiratory diseases: Secondary | ICD-10-CM | POA: Diagnosis not present

## 2023-11-26 DIAGNOSIS — Z884 Allergy status to anesthetic agent status: Secondary | ICD-10-CM | POA: Diagnosis not present

## 2023-11-26 HISTORY — PX: LOOP RECORDER INSERTION: EP1214

## 2023-11-26 LAB — BASIC METABOLIC PANEL WITH GFR
Anion gap: 7 (ref 5–15)
BUN: 11 mg/dL (ref 8–23)
CO2: 23 mmol/L (ref 22–32)
Calcium: 8.3 mg/dL — ABNORMAL LOW (ref 8.9–10.3)
Chloride: 102 mmol/L (ref 98–111)
Creatinine, Ser: 0.82 mg/dL (ref 0.44–1.00)
GFR, Estimated: >60 mL/min (ref >60)
Glucose, Bld: 95 mg/dL (ref 70–99)
Potassium: 4.1 mmol/L (ref 3.5–5.1)
Sodium: 132 mmol/L — ABNORMAL LOW (ref 135–145)

## 2023-11-26 LAB — URINALYSIS, ROUTINE W REFLEX MICROSCOPIC
Bilirubin Urine: NEGATIVE
Glucose, UA: NEGATIVE mg/dL
Ketones, ur: NEGATIVE mg/dL
Nitrite: NEGATIVE
Protein, ur: NEGATIVE mg/dL
Specific Gravity, Urine: 1.008 (ref 1.005–1.030)
pH: 5 (ref 5.0–8.0)

## 2023-11-26 LAB — CBC
HCT: 36.7 % (ref 36.0–46.0)
Hemoglobin: 12.5 g/dL (ref 12.0–15.0)
MCH: 34.2 pg — ABNORMAL HIGH (ref 26.0–34.0)
MCHC: 34.1 g/dL (ref 30.0–36.0)
MCV: 100.3 fL — ABNORMAL HIGH (ref 80.0–100.0)
Platelets: 221 K/uL (ref 150–400)
RBC: 3.66 MIL/uL — ABNORMAL LOW (ref 3.87–5.11)
RDW: 13 % (ref 11.5–15.5)
WBC: 6.5 K/uL (ref 4.0–10.5)
nRBC: 0 % (ref 0.0–0.2)

## 2023-11-26 LAB — PHOSPHORUS: Phosphorus: 3.4 mg/dL (ref 2.5–4.6)

## 2023-11-26 LAB — TSH: TSH: 0.523 u[IU]/mL (ref 0.350–4.500)

## 2023-11-26 LAB — TROPONIN I (HIGH SENSITIVITY)
Troponin I (High Sensitivity): 7 ng/L (ref <18)
Troponin I (High Sensitivity): 9 ng/L (ref <18)

## 2023-11-26 LAB — MAGNESIUM: Magnesium: 1.7 mg/dL (ref 1.7–2.4)

## 2023-11-26 SURGERY — LOOP RECORDER INSERTION

## 2023-11-26 MED ORDER — SODIUM CHLORIDE 0.9 % IV BOLUS
500.0000 mL | Freq: Once | INTRAVENOUS | Status: AC
  Administered 2023-11-26: 500 mL via INTRAVENOUS

## 2023-11-26 MED ORDER — ONDANSETRON HCL 4 MG/2ML IJ SOLN: 4.0000 mg | Freq: Four times a day (QID) | INTRAMUSCULAR | Status: DC | PRN

## 2023-11-26 MED ORDER — MIDAZOLAM HCL 2 MG/2ML IJ SOLN
2.0000 mg | Freq: Once | INTRAMUSCULAR | Status: AC | PRN
  Administered 2023-11-26: 2 mg via INTRAVENOUS

## 2023-11-26 MED ORDER — TETRACAINE HCL 1 % IJ SOLN
5.0000 mg | INTRAMUSCULAR | Status: DC
  Filled 2023-11-26: qty 2

## 2023-11-26 MED ORDER — LIDOCAINE 5 % EX PTCH
1.0000 | MEDICATED_PATCH | CUTANEOUS | Status: DC
  Administered 2023-11-26: 1 via TRANSDERMAL
  Filled 2023-11-26: qty 1

## 2023-11-26 MED ORDER — MECLIZINE HCL 12.5 MG PO TABS: 12.5000 mg | ORAL_TABLET | Freq: Three times a day (TID) | ORAL | Status: DC | PRN

## 2023-11-26 MED ORDER — SODIUM CHLORIDE 0.9 % IV SOLN: INTRAVENOUS | Status: DC

## 2023-11-26 MED ORDER — CLONAZEPAM 0.5 MG PO TABS
0.5000 mg | ORAL_TABLET | Freq: Every evening | ORAL | Status: DC | PRN
  Filled 2023-11-26: qty 1

## 2023-11-26 MED ORDER — ENOXAPARIN SODIUM 40 MG/0.4ML IJ SOSY
40.0000 mg | PREFILLED_SYRINGE | INTRAMUSCULAR | Status: DC
  Administered 2023-11-26: 40 mg via SUBCUTANEOUS
  Filled 2023-11-26 (×2): qty 0.4

## 2023-11-26 MED ORDER — IBUPROFEN 600 MG PO TABS
600.0000 mg | ORAL_TABLET | Freq: Four times a day (QID) | ORAL | Status: DC | PRN
  Administered 2023-11-26 (×2): 600 mg via ORAL
  Filled 2023-11-26 (×2): qty 1

## 2023-11-26 MED ORDER — ACETAMINOPHEN 650 MG RE SUPP: 650.0000 mg | Freq: Four times a day (QID) | RECTAL | Status: DC | PRN

## 2023-11-26 MED ORDER — RAMELTEON 8 MG PO TABS
8.0000 mg | ORAL_TABLET | Freq: Once | ORAL | Status: AC | PRN
  Administered 2023-11-26: 8 mg via ORAL
  Filled 2023-11-26: qty 1

## 2023-11-26 MED ORDER — ACETAMINOPHEN 325 MG PO TABS: 650.0000 mg | ORAL_TABLET | Freq: Four times a day (QID) | ORAL | Status: DC | PRN

## 2023-11-26 MED ORDER — IPRATROPIUM BROMIDE 0.06 % NA SOLN: 2.0000 | Freq: Every day | NASAL | Status: DC | PRN

## 2023-11-26 MED ORDER — ROSUVASTATIN CALCIUM 5 MG PO TABS
10.0000 mg | ORAL_TABLET | Freq: Every day | ORAL | Status: DC
  Administered 2023-11-26 – 2023-11-27 (×2): 10 mg via ORAL
  Filled 2023-11-26 (×2): qty 2

## 2023-11-26 MED ORDER — ASPIRIN 81 MG PO TBEC
81.0000 mg | DELAYED_RELEASE_TABLET | Freq: Every day | ORAL | Status: DC
  Administered 2023-11-26 – 2023-11-27 (×2): 81 mg via ORAL
  Filled 2023-11-26 (×2): qty 1

## 2023-11-26 MED ORDER — OXYCODONE HCL 5 MG PO TABS
5.0000 mg | ORAL_TABLET | ORAL | Status: DC | PRN
  Administered 2023-11-26: 5 mg via ORAL
  Filled 2023-11-26: qty 1

## 2023-11-26 MED ORDER — PANTOPRAZOLE SODIUM 40 MG PO TBEC
40.0000 mg | DELAYED_RELEASE_TABLET | Freq: Every day | ORAL | Status: DC
  Administered 2023-11-26 – 2023-11-27 (×2): 40 mg via ORAL
  Filled 2023-11-26 (×2): qty 1

## 2023-11-26 MED ORDER — ACETAMINOPHEN 500 MG PO TABS: 1000.0000 mg | ORAL_TABLET | Freq: Four times a day (QID) | ORAL | Status: DC | PRN

## 2023-11-26 MED ORDER — LIDOCAINE-EPINEPHRINE 1 %-1:100000 IJ SOLN
INTRAMUSCULAR | Status: DC | PRN
Start: 2023-11-26 — End: 2023-11-26
  Administered 2023-11-26: 10 mL

## 2023-11-26 MED ORDER — MELATONIN 5 MG PO TABS: 5.0000 mg | ORAL_TABLET | Freq: Every evening | ORAL | Status: DC | PRN

## 2023-11-26 MED ORDER — OXYCODONE HCL 5 MG PO TABS: 2.5000 mg | ORAL_TABLET | ORAL | Status: DC | PRN

## 2023-11-26 MED ORDER — ALBUTEROL SULFATE (2.5 MG/3ML) 0.083% IN NEBU: 2.5000 mg | INHALATION_SOLUTION | RESPIRATORY_TRACT | Status: DC | PRN

## 2023-11-26 MED ORDER — LIDOCAINE-EPINEPHRINE 1 %-1:100000 IJ SOLN
INTRAMUSCULAR | Status: AC
  Filled 2023-11-26: qty 1

## 2023-11-26 MED ORDER — FERROUS SULFATE 325 (65 FE) MG PO TABS
324.0000 mg | ORAL_TABLET | ORAL | Status: DC
  Administered 2023-11-26: 324 mg via ORAL
  Filled 2023-11-26: qty 1

## 2023-11-26 MED ORDER — ONDANSETRON HCL 4 MG PO TABS
4.0000 mg | ORAL_TABLET | Freq: Four times a day (QID) | ORAL | Status: DC | PRN
Start: 2023-11-26 — End: 2023-11-27

## 2023-11-26 MED ORDER — CLONAZEPAM 0.5 MG PO TABS: 0.5000 mg | ORAL_TABLET | Freq: Every day | ORAL | Status: DC

## 2023-11-26 MED ORDER — MIDAZOLAM HCL 2 MG/2ML IJ SOLN
2.0000 mg | Freq: Once | INTRAMUSCULAR | Status: DC | PRN
  Filled 2023-11-26: qty 2

## 2023-11-26 MED ORDER — VITAMIN B-12 1000 MCG PO TABS
500.0000 ug | ORAL_TABLET | Freq: Every day | ORAL | Status: DC
  Administered 2023-11-26 – 2023-11-27 (×2): 500 ug via ORAL
  Filled 2023-11-26 (×2): qty 1

## 2023-11-26 MED ORDER — IBUPROFEN 800 MG PO TABS
800.0000 mg | ORAL_TABLET | Freq: Once | ORAL | Status: AC
  Administered 2023-11-26: 800 mg via ORAL
  Filled 2023-11-26: qty 1

## 2023-11-26 MED ORDER — ORAL CARE MOUTH RINSE: 15.0000 mL | OROMUCOSAL | Status: DC | PRN

## 2023-11-26 MED ORDER — IOHEXOL 350 MG/ML SOLN
75.0000 mL | Freq: Once | INTRAVENOUS | Status: AC | PRN
Start: 2023-11-26 — End: 2023-11-26
  Administered 2023-11-26: 75 mL via INTRAVENOUS

## 2023-11-26 SURGICAL SUPPLY — 2 items
MONITOR REVEAL LINQ II (Prosthesis & Implant Heart) IMPLANT
PACK LOOP INSERTION (CUSTOM PROCEDURE TRAY) ×2 IMPLANT

## 2023-11-26 NOTE — H&P (Addendum)
 History and Physical    Patient: Kara Tapia FMW:[id number] DOB: 02/08/1941 DOA: 11/25/2023 DOS: the patient was seen and examined on 11/26/2023 PCP: Waddell Danelle ORN, MD  Patient coming from: Home  Chief Complaint:  Chief Complaint  Patient presents with   Loss of Consciousness   HPI: Kara Tapia is a 83 y.o. female with medical history significant of HTN, HLD, cognitive dysfunction-with history of prior syncope-presented with 2 episodes of loss of consciousness in the past 2 days.  Per patient-for the past month or so-she has had 6-8 episodes of fall/syncope.  For the past 2 days in a row-she has had 2 consecutive episodes of syncope resulting in fall.  In her last episode of syncope the night before-she thinks she may have hit her head-hence she presented to the ED for further evaluation and treatment  Palpation-none of these syncopal episodes are associated with prodrome.  She denies any recent nausea, vomiting or diarrhea.  Her oral intake has been  stable/at baseline.  She has no history of fever/cough/dysuria/hematuria.  She has had significant issues with syncope in the past-and has had neurology/cardiology evaluation without any unrevealing cause.  She recently had an echocardiogram as well as a Holter monitor which was stable.  She was apparently told that she might need a loop recorder placed at some point.  Although she has a history of hypertension-she only takes as needed hydralazine  if her systolic blood pressure is more than 150.  She was evaluated in the ED-was found to have positive orthostatic vital signs, along with some mild hyponatremia.  CT C-spine showed a age-indeterminate transverse process fracture at C4.  ED MD subsequently requested TRH for further evaluation and admission.   No fever/headache No chest pain/shortness of breath No nausea, vomiting or diarrhea No abdominal pain    Review of Systems: As mentioned in the history of present illness. All  other systems reviewed and are negative. 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 She has never been exposed to tobacco smoke. She has never used smokeless tobacco. She reports current alcohol  use of about 5.0 standard drinks of alcohol  per week. She reports current drug use. Drug: Other-see comments.  Allergies  Allergen Reactions   Lidocaine  Rash and Other (See Comments)    Affected the blood pressure- almost passed out also   Gabapentin Nausea Only  Stomach pain   Novocain [Procaine] Other (See Comments)    An injection at the dentist's caused salivation for 90+ days afterwards.    Family History  Problem Relation Age of Onset   Diabetes Mother    Heart disease Mother    Cancer Mother        type unknown   Bone cancer Father    Diabetes Sister    Lung disease Sister    COPD Sister    Heart attack Paternal  Grandfather    Down syndrome Daughter    Dementia Daughter    Other Daughter        colon surgery    Prior to Admission medications   Medication Sig Start Date End Date Taking? Authorizing Provider  albuterol  (VENTOLIN  HFA) 108 (90 Base) MCG/ACT inhaler Inhale 2 puffs into the lungs every 4 (four) hours as needed for wheezing or shortness of breath. 05/08/23   [provider]  aspirin  EC 81 MG tablet Take 81 mg by mouth daily. 07/04/23   [provider]  CALCIUM  PO Take 1 tablet by mouth daily after breakfast.    [provider]  Cholecalciferol (VITAMIN D3 PO) Take 1 capsule by mouth daily after breakfast.    [provider]  clonazePAM  (KLONOPIN ) 0.5 MG tablet Take 0.5 tablets (0.25 mg total) by mouth at bedtime. 10/16/23   Penumalli, Vikram R, MD  Cyanocobalamin  (VITAMIN B12 PO) Take 1 tablet by mouth daily after breakfast.    [provider]  donepezil  (ARICEPT ) 10 MG tablet Take 1 tablet (10 mg total) by mouth daily. 04/10/23   Lomax, Amy, NP  ferrous sulfate  324 MG TBEC Take 324 mg by mouth every other day.    [provider]  hydrALAZINE  (APRESOLINE ) 25 MG tablet Take 1 tablet (25 mg total) by mouth 2 (two) times daily as needed for up to 30 doses (give hydralazine  25 mg to take as needed for SBP over 150). 07/04/23   Christobal Guadalajara, MD  ipratropium (ATROVENT ) 0.06 % nasal spray Place 2 sprays into the nose daily as needed for rhinitis. 08/07/23   [provider]  lidocaine  (LIDODERM ) 5 % Place 1 patch onto the skin daily. Remove & Discard patch within 12 hours or as directed by MD 10/27/23   Neysa Caron PARAS, DO  MAGNESIUM PO Take 1 tablet by mouth daily after breakfast.    [provider]  meclizine  (ANTIVERT ) 25 MG tablet Take 0.5 tablets (12.5 mg total) by mouth 3 (three) times daily as needed for dizziness. 07/04/23   Christobal Guadalajara, MD  metoprolol  tartrate (LOPRESSOR ) 25 MG tablet Take 1 tablet (25 mg total) by mouth once for 1  dose. Take 2 hours prior to coronary CTA scan. Patient not taking: Reported on 10/21/2023 07/04/23 10/21/23  Goodrich, Callie E, PA-C  omeprazole (PRILOSEC) 20 MG capsule Take 20 mg by mouth every morning. 07/10/23   [provider]  rosuvastatin  (CRESTOR ) 10 MG tablet Take 10 mg by mouth daily. 12/29/22   [provider]  SUMAtriptan  (IMITREX ) 25 MG tablet Take 1 tablet (25 mg total) by mouth every 2 (two) hours as needed for migraine. May repeat in 2 hours if headache persists or recurs. 05/29/23   Dohmeier, Dedra, MD  TYLENOL  500 MG tablet Take 500 mg by mouth every 6 (six) hours as needed (for headaches).    [provider]    Physical Exam: Vitals:   11/26/23 0300 11/26/23 0500 11/26/23 9483 11/26/23 9257  BP: (!) 150/85   (!) 143/90  Pulse: 66 66  62  Resp: 14 17  14   Temp:   98.4 F (36.9 C)   TempSrc:   Oral   SpO2: 95% 97%  97%  Weight:      Height:       Gen Exam:Alert awake-not in any distress HEENT:atraumatic, normocephalic.  Hard cervical collar in place Chest: B/L clear to auscultation anteriorly CVS:S1S2 regular Abdomen:soft non tender, non distended Extremities:no edema Neurology: Non focal Skin: no rash   Data Reviewed:     Latest Ref Rng & Units 11/26/2023    5:49 AM 11/25/2023   10:54 PM 11/25/2023   10:53 PM  CBC  WBC 4.0 - 10.5 K/uL 6.5   7.4   Hemoglobin 12.0 - 15.0 g/dL 87.4  87.0  87.1   Hematocrit 36.0 - 46.0 % 36.7  38.0  37.5   Platelets 150 - 400 K/uL 221   213         Latest Ref Rng & Units 11/26/2023    5:49 AM 11/25/2023   10:54 PM 11/25/2023   10:53 PM  BMP  Glucose 70 - 99 mg/dL 95  894  892   BUN 8 - 23 mg/dL 11  15  14    Creatinine 0.44 - 1.00 mg/dL 9.17  8.89  9.10   Sodium 135 - 145 mmol/L 132  128  129   Potassium 3.5 - 5.1 mmol/L 4.1  3.6  3.6   Chloride 98 - 111 mmol/L 102  96  97   CO2 22 - 32 mmol/L 23   23   Calcium  8.9 - 10.3 mg/dL 8.3   9.0      Twelve-lead EKG: Normal sinus rhythm  Assessment and  Plan: Syncope/falls Although she has had numerous prior unexplained syncope/falls-this particular episode in the past few days-sounds like volume depletion-given positive orthostatic hypotension/hyponatremia.  Although she denies any history of GI loss or diuretic use.  However she recently had a loss of her child-and the daughter think she is drinking more coffee than fluids-and has been drinking wine every night along with Klonopin -suspect there may be some subtle amount of dehydration that may be contributing to positive orthostatic vital signs. Will gently hydrate overnight-Place TED stockings/abdominal binders Monitor on telemetry Mobilize with PT/OT. Will discuss with cardiology if further workup with a loop recorder is necessary.  Age-indeterminate transverse process fracture at C4 Neurologically intact CTA negative for vascular injury ED MD-Dr. Rancour-discussed with Dr. Debby of neurosurgery-c-collar, outpatient follow-up in 6 weeks.  Hyponatremia Mild Gently hydrate and recheck electrolytes tomorrow morning  Dysphagia/odynophagia Appears to have vague complaints of difficulty swallowing-does not complain of any overt dysphagia-some fears of swallowing. Will get SLP evaluation.  History of HTN Only takes hydralazine  as needed if blood pressures more than 150 Continue to allow permissive hypertension  HLD Statin  Cognitive dysfunction Appears to have some mild memory impairment Continue Aricept .  Chronic low back pain Reportedly has lumbar spinal stenosis-follows with neurosurgery Apparently has steroid epidural injections in the past that has improved the pain Currently pain is at baseline.  Grief/bereavement Recent loss of child   Advance Care Planning:   Code Status: Limited: Do not attempt resuscitation (DNR) -DNR-LIMITED -Do Not Intubate/DNI    Consults: None  Family Communication: Daughter Robin-updated over the phone  Severity of Illness: The appropriate  patient status for this patient is OBSERVATION. Observation status is judged to be reasonable and necessary in order to provide  the required intensity of service to ensure the patient's safety. The patient's presenting symptoms, physical exam findings, and initial radiographic and laboratory data in the context of their medical condition is felt to place them at decreased risk for further clinical deterioration. Furthermore, it is anticipated that the patient will be medically stable for discharge from the hospital within 2 midnights of admission.   Author: Donalda Applebaum, MD 11/26/2023 8:46 AM  For on call review [email or web address].

## 2023-11-26 NOTE — ED Notes (Signed)
 Nt called CCMD@7 :42am

## 2023-11-26 NOTE — Consult Note (Signed)
 CC: Syncope  HPI:     Patient is a 83 y.o. female presents after numerous syncopal episodes without warning.  She states last Monday she had a fall and afterwards, did complain of posterior neck pain.  No new neurologic symptoms.    Patient Active Problem List   Diagnosis Date Noted   CAD in native artery 07/03/2023   Exertional dyspnea 07/03/2023   Chest pain 07/02/2023   Hyponatremia 07/02/2023   Mixed hyperlipidemia 01/09/2023   Mild memory disturbance 01/09/2023   Medicare annual wellness visit, subsequent 01/09/2023   Gastroesophageal reflux disease without esophagitis 01/09/2023   Elevated blood pressure reading 01/09/2023   Vertigo 01/09/2023   COPD mixed type (HCC) 01/09/2023   Hematuria 01/09/2023   Abnormal cervical Papanicolaou smear 01/09/2023   Senile osteoporosis 08/03/2021   Pain in joint of right shoulder 04/29/2019   Syncope 01/12/2019   Hypertension 01/12/2019   Lisping 12/02/2017   Sleep behavior disorder, REM 06/04/2017   MCI (mild cognitive impairment) 06/04/2017   Insomnia 07/06/2015   Nightmares REM-sleep type 07/06/2015   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   Acquired trigger finger 06/14/2015   Parasomnia, organic 10/04/2014   Migraine with status migrainosus 08/09/2014   Scotoma 08/09/2014   Intractable migraine with aura with status migrainosus 08/09/2014   REM sleep behavior disorder 08/09/2014   Past Medical History:  Diagnosis Date   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   Unknown   amLODipine  (NORVASC ) 2.5 MG tablet Take 2.5 mg by mouth daily as needed (Systolic blood pressure >150).   Taking As Needed   aspirin  EC 81 MG tablet Take 81 mg by mouth daily.   11/25/2023   CALCIUM  PO Take 1 tablet by mouth daily after breakfast.   11/25/2023   Cholecalciferol (VITAMIN D3 PO) Take 1 capsule by mouth daily after breakfast.   11/25/2023   clonazePAM  (KLONOPIN ) 0.5 MG tablet Take 0.5 tablets (0.25 mg total) by mouth at bedtime. (Patient taking differently: Take 0.25 mg by mouth at bedtime as needed (sleep).) 15 tablet 5 Past Week   Cyanocobalamin  (VITAMIN B12 PO) Take 1 tablet by mouth daily after breakfast.   11/25/2023   donepezil  (ARICEPT ) 10 MG  tablet Take 1 tablet (10 mg total) by mouth daily. 90 tablet 3 11/25/2023   ibuprofen  (ADVIL ) 200 MG tablet Take 600-800 mg by mouth 2 (two) times daily as needed for mild pain (pain score 1-3).   Past Week   ipratropium (ATROVENT ) 0.06 % nasal spray Place 2 sprays into the nose daily.   Past Week   lidocaine  (LIDODERM ) 5 % Place 1 patch onto the skin daily. Remove & Discard patch within 12 hours or as directed by MD 14 patch 0 Past Week   MAGNESIUM PO Take 1 tablet  by mouth daily after breakfast.   11/25/2023   meclizine  (ANTIVERT ) 12.5 MG tablet Take 12.5 mg by mouth 3 (three) times daily as needed for dizziness.   Taking As Needed   pregabalin (LYRICA) 75 MG capsule Take 75 mg by mouth 2 (two) times daily.   11/25/2023   rosuvastatin  (CRESTOR ) 10 MG tablet Take 10 mg by mouth daily.   11/25/2023   albuterol  (VENTOLIN  HFA) 108 (90 Base) MCG/ACT inhaler Inhale 2 puffs into the lungs every 4 (four) hours as needed for wheezing or shortness of breath. (Patient not taking: Reported on 11/26/2023)   Not Taking   Allergies  Allergen Reactions   Lidocaine  Rash and Other (See Comments)    Affected the blood pressure- almost passed out also   Gabapentin Nausea Only    Stomach pain   Novocain [Procaine] Other (See Comments)    An injection at the dentist's caused salivation for 90+ days afterwards.    Social History   Tobacco Use   Smoking status: Every Day    Current packs/day: 0.50    Types: Cigarettes    Passive exposure: Never   Smokeless tobacco: Never  Substance Use Topics   Alcohol  use: Yes    Alcohol /week: 5.0 standard drinks of alcohol     Types: 5 Glasses of wine per week    Comment: 1 glass of wine nightly    Family History  Problem Relation Age of Onset   Diabetes Mother    Heart disease Mother    Cancer Mother        type unknown   Bone cancer Father    Diabetes Sister    Lung disease Sister    COPD Sister    Heart attack Paternal Grandfather    Down syndrome Daughter    Dementia Daughter    Other Daughter        colon surgery     Review of Systems Pertinent items are noted in HPI.  Objective:   Patient Vitals for the past 8 hrs:  BP Temp Temp src Pulse Resp SpO2  11/26/23 1330 (!) 145/98 98.4 F (36.9 C) Oral 64 18 100 %  11/26/23 1150 (!) 152/82 -- -- -- 14 --  11/26/23 1118 (!) 148/90 98.2 F (36.8 C) Oral 64 17 97 %  11/26/23 1001 133/71 -- -- -- -- --  11/26/23 0800 (!) 148/83 -- -- (!) 56 15 --  11/26/23 0742 (!)  143/90 -- -- 62 14 97 %  11/26/23 0734 -- -- -- -- -- 98 %   I/O last 3 completed shifts: In: 500 [IV Piggyback:500] Out: -  No intake/output data recorded.      General : Alert, cooperative, no distress, appears stated age   Head:  Normocephalic/atraumatic    Eyes: PERRL, conjunctiva/corneas clear, EOM's intact. Fundi could not be visualized Neck: C-collar in place Chest:  Respirations unlabored Chest wall: no tenderness or deformity  Heart: Regular rate and rhythm Abdomen: Soft, nontender and nondistended Extremities: warm and well-perfused Skin: normal turgor, color and texture Neurologic:  Alert, oriented x 3.  Eyes open spontaneously. PERRL, EOMI, VFC, no facial droop. V1-3 intact.  No dysarthria, tongue protrusion symmetric.  CNII-XII intact. Normal strength, sensation and reflexes throughout.  No pronator drift, full strength in legs       Data Review CT cervical spine without contrast was reviewed.  Nondisplaced left C4 transverse process fracture is noted.  There is multilevel chronic cervical degenerative disease with loss of lordosis.  CTA shows no vascular injury.  Assessment:   Principal Problem:   Syncope  This is a 83 year old woman admitted for multiple syncopal events.  She has a age-indeterminate nondisplaced left C4 transverse process fracture. Plan:  I discussed the natural history of cervical transverse process fractures with the patient.  These fractures of the vestigial portion of the spine heal without incident and do not require any intervention.  As she is having some neck pain, I do recommend wearing the cervical collar, though it can be worn mostly for comfort and can be removed for showers.  She can stop wearing it after 2 to 4 weeks if her neck feels better.  Scheduled follow-up for the fracture is not required.  All questions and concerns were answered.

## 2023-11-26 NOTE — ED Provider Notes (Signed)
 [place] EMERGENCY DEPARTMENT AT [facility] Provider Note   CSN: [id number] Arrival date & time: 11/25/23  2227     Patient presents with: Loss of Consciousness   Kara Tapia is a 83 y.o. female.   Patient with a history of migraines, arthritis, palpitations, 1 month of right-sided low back pain presents with recurrent syncopal episodes.  Episodes happening without prodrome.  Estimates she has had about 7 in the past month.  Denies any prodrome such as lightheadedness, dizziness or feeling she is going to pass out.  No chest pain or shortness of breath.  Did fall twice yesterday may have hit head.  Possible loss of consciousness.  Does have a history of vertigo but this feels different.  She has no warning of the syncopal episodes are happening.  She has ongoing right sided low back pain for the past 1 month that she believes is worse since falling.  Has had an MRI of her back previously.  She follows with neurosurgeon.  Has been evaluated for these episodes in the past but with cardiology and neurology without revealing findings.  Has had echocardiogram as well as Holter monitor without findings.  Was told she might did have a loop recorder placed. States she does not have any vertigo right now.  Complains of pain to her head and her neck and her right low back.  Pain radiates down her right leg.  Has had several episodes of urinary incontinence in the past few weeks.  Denies chest pain or shortness of breath currently.  Also reports intermittent episodes of blurry vision for several weeks to months.  This happens when she is reading.  She gets blurry vision in both eyes and has to stop for several minutes and then it improves.  Denies any eye pain with these episodes.  Denies any significant headache with these episodes.  The history is provided by the patient and a relative.  Loss of Consciousness Associated symptoms: headaches and weakness   Associated symptoms: no fever, no  nausea, no shortness of breath and no vomiting        Prior to Admission medications   Medication Sig Start Date End Date Taking? Authorizing Provider  albuterol  (VENTOLIN  HFA) 108 (90 Base) MCG/ACT inhaler Inhale 2 puffs into the lungs every 4 (four) hours as needed for wheezing or shortness of breath. 05/08/23   [provider]  aspirin  EC 81 MG tablet Take 81 mg by mouth daily. 07/04/23   [provider]  CALCIUM  PO Take 1 tablet by mouth daily after breakfast.    [provider]  Cholecalciferol (VITAMIN D3 PO) Take 1 capsule by mouth daily after breakfast.    [provider]  clonazePAM  (KLONOPIN ) 0.5 MG tablet Take 0.5 tablets (0.25 mg total) by mouth at bedtime. 10/16/23   Penumalli, Vikram R, MD  Cyanocobalamin  (VITAMIN B12 PO) Take 1 tablet by mouth daily after breakfast.    [provider]  donepezil  (ARICEPT ) 10 MG tablet Take 1 tablet (10 mg total) by mouth daily. 04/10/23   Lomax, Amy, NP  ferrous sulfate  324 MG TBEC Take 324 mg by mouth every other day.    [provider]  hydrALAZINE  (APRESOLINE ) 25 MG tablet Take 1 tablet (25 mg total) by mouth 2 (two) times daily as needed for up to 30 doses (give hydralazine  25 mg to take as needed for SBP over 150). 07/04/23   Christobal Guadalajara, MD  ipratropium (ATROVENT ) 0.06 % nasal spray  Place 2 sprays into the nose daily as needed for rhinitis. 08/07/23   [provider]  lidocaine  (LIDODERM ) 5 % Place 1 patch onto the skin daily. Remove & Discard patch within 12 hours or as directed by MD 10/27/23   Neysa Caron PARAS, DO  MAGNESIUM PO Take 1 tablet by mouth daily after breakfast.    [provider]  meclizine  (ANTIVERT ) 25 MG tablet Take 0.5 tablets (12.5 mg total) by mouth 3 (three) times daily as needed for dizziness. 07/04/23   Christobal Guadalajara, MD  metoprolol  tartrate (LOPRESSOR ) 25 MG tablet Take 1 tablet (25 mg total) by mouth once for 1 dose. Take 2 hours prior to coronary CTA  scan. Patient not taking: Reported on 10/21/2023 07/04/23 10/21/23  Goodrich, Callie E, PA-C  omeprazole (PRILOSEC) 20 MG capsule Take 20 mg by mouth every morning. 07/10/23   [provider]  rosuvastatin  (CRESTOR ) 10 MG tablet Take 10 mg by mouth daily. 12/29/22   [provider]  SUMAtriptan  (IMITREX ) 25 MG tablet Take 1 tablet (25 mg total) by mouth every 2 (two) hours as needed for migraine. May repeat in 2 hours if headache persists or recurs. 05/29/23   Dohmeier, Dedra, MD  TYLENOL  500 MG tablet Take 500 mg by mouth every 6 (six) hours as needed (for headaches).    [provider]    Allergies: Lidocaine , Gabapentin, and Novocain [procaine]    Review of Systems  Constitutional:  Negative for activity change, appetite change and fever.  Respiratory:  Negative for cough, chest tightness and shortness of breath.   Cardiovascular:  Positive for syncope.  Gastrointestinal:  Negative for abdominal pain, nausea and vomiting.  Genitourinary:  Negative for dysuria.  Musculoskeletal:  Positive for arthralgias, back pain, myalgias and neck pain.  Skin:  Negative for rash.  Neurological:  Positive for syncope, weakness and headaches.   all other systems are negative except as noted in the HPI and PMH.    Updated Vital Signs BP 95/66   Pulse 75   Temp 97.9 F (36.6 C)   Resp 16   Ht 5' 6 (1.676 m)   Wt 57.2 kg   SpO2 96%   BMI 20.35 kg/m   Physical Exam Vitals and nursing note reviewed.  Constitutional:      General: She is not in acute distress.    Appearance: She is well-developed. She is not ill-appearing.  HENT:     Head: Normocephalic and atraumatic.     Mouth/Throat:     Pharynx: No oropharyngeal exudate.  Eyes:     Conjunctiva/sclera: Conjunctivae normal.     Pupils: Pupils are equal, round, and reactive to light.  Neck:     Comments: No meningismus. Cardiovascular:     Rate and Rhythm: Normal rate and regular rhythm.     Heart sounds: Normal  heart sounds. No murmur heard. Pulmonary:     Effort: Pulmonary effort is normal. No respiratory distress.     Breath sounds: Normal breath sounds.  Abdominal:     Palpations: Abdomen is soft.     Tenderness: There is no abdominal tenderness. There is no guarding or rebound.  Musculoskeletal:        General: Tenderness present. Normal range of motion.     Cervical back: Normal range of motion and neck supple.     Comments: R paraspinal lumbar tenderness  5/5 strength in bilateral lower extremities. Ankle plantar and dorsiflexion intact. Great toe extension intact bilaterally. +2 DP and  PT pulses. +1 patellar reflexes bilaterally. Normal gait.   Skin:    General: Skin is warm.  Neurological:     Mental Status: She is alert and oriented to person, place, and time.     Cranial Nerves: No cranial nerve deficit.     Motor: No abnormal muscle tone.     Coordination: Coordination normal.     Comments:  5/5 strength throughout. CN 2-12 intact.Equal grip strength.   Psychiatric:        Behavior: Behavior normal.     (all labs ordered are listed, but only abnormal results are displayed) Labs Reviewed  COMPREHENSIVE METABOLIC PANEL WITH GFR - Abnormal; Notable for the following components:      Result Value   Sodium 129 (*)    Chloride 97 (*)    Glucose, Bld 107 (*)    Total Protein 5.8 (*)    All other components within normal limits  CBC - Abnormal; Notable for the following components:   RBC 3.75 (*)    MCH 34.1 (*)    All other components within normal limits  URINALYSIS, ROUTINE W REFLEX MICROSCOPIC - Abnormal; Notable for the following components:   Hgb urine dipstick MODERATE (*)    Leukocytes,Ua TRACE (*)    Bacteria, UA RARE (*)    All other components within normal limits  CBC - Abnormal; Notable for the following components:   RBC 3.66 (*)    MCV 100.3 (*)    MCH 34.2 (*)    All other components within normal limits  I-STAT CHEM 8, ED - Abnormal; Notable for the  following components:   Sodium 128 (*)    Chloride 96 (*)    Creatinine, Ser 1.10 (*)    Glucose, Bld 105 (*)    All other components within normal limits  BASIC METABOLIC PANEL WITH GFR  MAGNESIUM  PHOSPHORUS  TSH  CBG MONITORING, ED  TROPONIN I (HIGH SENSITIVITY)  TROPONIN I (HIGH SENSITIVITY)    EKG: EKG Interpretation Date/Time:  Tuesday November 25 2023 22:41:00 EDT Ventricular Rate:  86 PR Interval:  184 QRS Duration:  84 QT Interval:  388 QTC Calculation: 464 R Axis:   18  Text Interpretation: Normal sinus rhythm Nonspecific ST and T wave abnormality Abnormal ECG When compared with ECG of 27-Oct-2023 13:29, PREVIOUS ECG IS PRESENT No significant change was found Confirmed by Carita Senior [phone]) on 11/26/2023 12:04:11 AM  Radiology: CT Cervical Spine Wo Contrast Result Date: 11/26/2023 EXAM: CT CERVICAL SPINE WITHOUT CONTRAST 11/26/2023 02:39:26 AM TECHNIQUE: CT of the cervical spine was performed without the administration of intravenous contrast. Multiplanar reformatted images are provided for review. Automated exposure control, iterative reconstruction, and/or weight based adjustment of the mA/kV was utilized to reduce the radiation dose to as low as reasonably achievable. COMPARISON: None available. CLINICAL HISTORY: Neck trauma (Age >= 65y). Pt has been having syncopal episodes for last couple of weeks. Has had about 7 in total. Pt remembers walking then waking up on the ground. Happened twice yesterday. May have hit head. No thinners. Denies feeling dizzy before syncopal episode. No CP ; SOB. FINDINGS: CERVICAL SPINE: BONES AND ALIGNMENT: Age-Indeterminate minimally displaced fracture of the left transverse process of C4. DEGENERATIVE CHANGES: Multilevel mid cervical degenerative disc disease without spinal canal stenosis. SOFT TISSUES: No prevertebral soft tissue swelling. IMPRESSION: 1. Age-indeterminate minimally displaced fracture of the left transverse process of C4.  2. Multilevel mid cervical degenerative disc disease without spinal canal stenosis. Electronically signed by:  Franky Stanford MD 11/26/2023 03:07 AM EDT RP Workstation: [id number]   CT Lumbar Spine Wo Contrast Result Date: 11/26/2023 EXAM: CT OF THE LUMBAR SPINE WITHOUT CONTRAST 11/26/2023 02:39:26 AM TECHNIQUE: CT of the lumbar spine was performed without the administration of intravenous contrast. Multiplanar reformatted images are provided for review. Automated exposure control, iterative reconstruction, and/or weight based adjustment of the mA/kV was utilized to reduce the radiation dose to as low as reasonably achievable. COMPARISON: None available. CLINICAL HISTORY: Lumbar radiculopathy, trauma. Pt has been having syncopal episodes for last couple of weeks. Has had about 7 in total. Pt remembers walking then waking up on the ground. Happened twice yesterday. May have hit head. No thinners. Denies feeling dizzy before syncopal episode. No CP ; SOB. Does have tingling down the back of both thighs but that is related to spinal issues she is being seen fo FINDINGS: BONES AND ALIGNMENT: Grade 1 anterolisthesis at L4-5. No acute fracture or suspicious bone lesion. DEGENERATIVE CHANGES: Multilevel lower lumbar facet arthrosis. SOFT TISSUES: Calcific aortic atherosclerosis. No acute abnormality. IMPRESSION: 1. No acute findings. 2. Grade 1 anterolisthesis at L4-5. 3. Multilevel lower lumbar facet arthrosis. Electronically signed by: Franky Stanford MD 11/26/2023 03:04 AM EDT RP Workstation: [id number]   CT ANGIO HEAD NECK W WO CM Result Date: 11/26/2023 EXAM: CTA Head and Neck with Intravenous Contrast. CT Head without and with Contrast. CLINICAL HISTORY: Neuro deficit, acute, stroke suspected. Pt has been having syncopal episodes for last couple of weeks. Has had about 7 in total. Pt remembers walking then waking up on the ground. Happened twice yesterday. May have hit head. No thinners. Denies feeling dizzy before  syncopal episode. No CP ; SOB. Does have tingling down the back of both thighs but that is related to spinal issues she is being seen fo. TECHNIQUE: Axial CTA images of the head and neck performed with intravenous contrast. MIP reconstructed images were created and reviewed. Axial computed tomography images of the head/brain performed without and with intravenous contrast. Note: Per PQRS, the description of internal carotid artery percent stenosis, including 0 percent or normal exam, is based on [place] American Symptomatic Carotid Endarterectomy Trial (NASCET) criteria. Dose reduction technique was used including one or more of the following: automated exposure control, adjustment of mA and kV according to patient size, and/or iterative reconstruction. CONTRAST: 75mL (iohexol  (OMNIPAQUE ) 350 MG/ML injection 75 mL IOHEXOL  350 MG/ML SOLN) COMPARISON: None provided. FINDINGS: BRAIN: No acute intraparenchymal hemorrhage. No mass lesion. No CT evidence for acute territorial infarct. No midline shift or extra-axial collection. VENTRICLES: No hydrocephalus. ORBITS: The orbits are unremarkable. SINUSES AND MASTOIDS: The paranasal sinuses and mastoid air cells are clear. COMMON CAROTID ARTERIES: No significant stenosis. No dissection or occlusion. INTERNAL CAROTID ARTERIES: No stenosis by NASCET criteria. No dissection or occlusion. VERTEBRAL ARTERIES: No significant stenosis. No dissection or occlusion. ANTERIOR CEREBRAL ARTERIES: No significant stenosis. No occlusion. No aneurysm. MIDDLE CEREBRAL ARTERIES: No significant stenosis. No occlusion. No aneurysm. POSTERIOR CEREBRAL ARTERIES: No significant stenosis. No occlusion. No aneurysm. BASILAR ARTERY: No significant stenosis. No occlusion. No aneurysm. SOFT TISSUES: No acute finding. No masses or lymphadenopathy. BONES: No acute osseous abnormality. IMPRESSION: 1. No acute intracranial hemorrhage or ischemic change. 2. No evidence of significant stenosis, aneurysmal  dilatation, or dissection involving the arteries of the head and neck. Electronically signed by: Franky Stanford MD 11/26/2023 02:47 AM EDT RP Workstation: [id number]   DG Chest 2 View Result Date: 11/26/2023 CLINICAL DATA:  Syncope EXAM: CHEST - 2  VIEW COMPARISON:  07/02/2023 FINDINGS: Lungs are well expanded, symmetric, and clear. No pneumothorax or pleural effusion. Cardiac size within normal limits. Pulmonary vascularity is normal. Osseous structures are age-appropriate. No acute bone abnormality. IMPRESSION: 1. No active cardiopulmonary disease. Electronically Signed   By: Dorethia Molt M.D.   On: 11/26/2023 01:21     Procedures   Medications Ordered in the ED  sodium chloride  0.9 % bolus 500 mL (has no administration in time range)  ibuprofen  (ADVIL ) tablet 800 mg (has no administration in time range)                                    Medical Decision Making Amount and/or Complexity of Data Reviewed Independent Historian: caregiver Labs: ordered. Decision-making details documented in ED Course. Radiology: ordered and independent interpretation performed. Decision-making details documented in ED Course. ECG/medicine tests: ordered and independent interpretation performed. Decision-making details documented in ED Course.  Risk Prescription drug management. Decision regarding hospitalization.   Recurrent syncopal episodes times several months.  Recent head trauma.  GCS is 15, ABCs are intact.  No blood thinner use.  Stable vitals on arrival.  EKG shows sinus rhythm without Brugada or prolonged QT.  Orthostatics are positive.  Systolic blood pressure 140 drops to 88 systolic with standing.  Will hydrate.  Cardiology records reviewed.  He echocardiogram showed normal ejection fraction normal valve function.  Cardiac monitor demonstrated PACs and brief episodes of nonsustained atrial tachycardia without pauses.  Orthostatics positive as above.  Labs with mild hyponatremia 128.  Some  dehydration may be contributing to her recurrent syncopal episodes but would expect program in that situation CT abdomen pelvis in August did not show any evidence of aortic aneurysm.  Orthostasis certainly contribute to her syncope.  Will need to hold her blood pressure medications.  CT head and C-spine negative for acute traumatic injury except for age-indeterminate transverse process fracture at C4.  She is neurovascularly intact.  CTA is negative for vascular injury or large vessel occlusion. CT lumbar spine shows stable anterolisthesis as well as facet arthrosis.  Discussed with Dr. Debby of neurosurgery.  Agrees with pain control as well as c-collar and outpatient follow-up in 6 weeks.  Patient with recurrent episodes of syncope may be due to orthostasis.  Has had reassuring workup thus far.  Labs today show no significant AKI.  She is given IV fluids.  Will need admission for recurrent episodes of syncope of uncertain etiology. ADmission d/w Dr. Segars.      Final diagnoses:  None    ED Discharge Orders     None          Creighton Longley, Garnette, MD 11/26/23 [phone]

## 2023-11-26 NOTE — Evaluation (Signed)
 Physical Therapy Evaluation Patient Details Name: Kara Tapia MRN: [id number] DOB: July 09, 1940 Today's Date: 11/26/2023  History of Present Illness  Pt is an 83 yo female presenting to [facility] on 11/26/23 with recurrent syncopal episodes. CT negative for acute abnormalities. Pt found to have nondisplaced C4 transverse process fx. PMH of vertigo, HTN, PVD  Clinical Impression  Pt presents to PT with recent history of multiple syncopal episodes of unknown etiology. Orthostatic vitals negative during my session and pt reporting mild dizziness during the session. Expect pt will do well regaining her mobility if syncopal episodes abate. May consider HHPT if not back to baseline.         If plan is discharge home, recommend the following: Assist for transportation;Help with stairs or ramp for entrance   Can travel by private vehicle        Equipment Recommendations None recommended by PT  Recommendations for Other Services       Functional Status Assessment Patient has had a recent decline in their functional status and demonstrates the ability to make significant improvements in function in a reasonable and predictable amount of time.     Precautions / Restrictions Precautions Precautions: Fall;Other (comment) Recall of Precautions/Restrictions: Intact Precaution/Restrictions Comments: Watch orthostatics Required Braces or Orthoses: Cervical Brace Cervical Brace: Hard collar;Other (comment);For comfort (See neurosurgery note 9/3) Restrictions Weight Bearing Restrictions Per Provider Order: No      Mobility  Bed Mobility Overal bed mobility: Modified Independent             General bed mobility comments: Incr time    Transfers Overall transfer level: Needs assistance Equipment used: None, Rollator (4 wheels) Transfers: Sit to/from Stand Sit to Stand: Contact guard assist           General transfer comment: Assist for safety    Ambulation/Gait Ambulation/Gait  assistance: Contact guard assist Gait Distance (Feet): 160 Feet Assistive device: Rollator (4 wheels) Gait Pattern/deviations: Step-through pattern, Decreased stride length Gait velocity: decr Gait velocity interpretation: 1.31 - 2.62 ft/sec, indicative of limited community ambulator   General Gait Details: Assist for safety/lines. Pt reported mild dizziness.  Stairs            Wheelchair Mobility     Tilt Bed    Modified Rankin (Stroke Patients Only)       Balance Overall balance assessment: Needs assistance Sitting-balance support: Feet unsupported, No upper extremity supported Sitting balance-Leahy Scale: Good     Standing balance support: During functional activity, No upper extremity supported Standing balance-Leahy Scale: Fair                               Pertinent Vitals/Pain Pain Assessment Pain Assessment: Faces Faces Pain Scale: Hurts little more Pain Location: back Pain Descriptors / Indicators: Shooting, Sharp Pain Intervention(s): Limited activity within patient's tolerance, Patient requesting pain meds-RN notified, Monitored during session, Repositioned    Home Living Family/patient expects to be discharged to:: Private residence Living Arrangements: Alone Available Help at Discharge: Family;Available PRN/intermittently Type of Home: House       Alternate Level Stairs-Number of Steps: basement, main, and upper levels Home Layout: Multi-level;Able to live on main level with bedroom/bathroom Home Equipment: Shower seat - built in Additional Comments: 6 falls recently since Aug    Prior Function Prior Level of Function : Driving;Independent/Modified Independent;History of Falls (last six months)             Mobility  Comments: no AD, will furniture walk or use walking sticks occassionally ADLs Comments: independent     Extremity/Trunk Assessment   Upper Extremity Assessment Upper Extremity Assessment: Overall WFL for tasks  assessed    Lower Extremity Assessment Lower Extremity Assessment: Generalized weakness    Cervical / Trunk Assessment Cervical / Trunk Assessment: Other exceptions Cervical / Trunk Exceptions: C collar  Communication   Communication Communication: No apparent difficulties    Cognition Arousal: Alert Behavior During Therapy: WFL for tasks assessed/performed   PT - Cognitive impairments: No apparent impairments                         Following commands: Intact       Cueing Cueing Techniques: Verbal cues     General Comments General comments (skin integrity, edema, etc.): Not orthostatic. See flow sheet    Exercises     Assessment/Plan    PT Assessment Patient needs continued PT services  PT Problem List Decreased strength;Decreased mobility;Decreased activity tolerance;Cardiopulmonary status limiting activity;Decreased balance       PT Treatment Interventions DME instruction;Functional mobility training;Gait training;Stair training;Therapeutic activities;Therapeutic exercise;Balance training;Patient/family education    PT Goals (Current goals can be found in the Care Plan section)  Acute Rehab PT Goals Patient Stated Goal: Resolve syncopal episodes PT Goal Formulation: With patient Time For Goal Achievement: 12/10/23 Potential to Achieve Goals: Good    Frequency Min 2X/week     Co-evaluation               AM-PAC PT 6 Clicks Mobility  Outcome Measure Help needed turning from your back to your side while in a flat bed without using bedrails?: None Help needed moving from lying on your back to sitting on the side of a flat bed without using bedrails?: None Help needed moving to and from a bed to a chair (including a wheelchair)?: A Little Help needed standing up from a chair using your arms (e.g., wheelchair or bedside chair)?: A Little Help needed to walk in hospital room?: A Little Help needed climbing 3-5 steps with a railing? : A  Little 6 Click Score: 20    End of Session   Activity Tolerance: Patient tolerated treatment well Patient left: in bed;with call bell/phone within reach Nurse Communication: Mobility status PT Visit Diagnosis: Unsteadiness on feet (R26.81);Other abnormalities of gait and mobility (R26.89);Muscle weakness (generalized) (M62.81);Repeated falls (R29.6);History of falling (Z91.81)    Time: 8859-8795 PT Time Calculation (min) (ACUTE ONLY): 24 min   Charges:   PT Evaluation $PT Eval Moderate Complexity: 1 Mod PT Treatments $Gait Training: 8-22 mins PT General Charges $$ ACUTE PT VISIT: 1 Visit         [facility] PT Acute Rehabilitation Services Office [phone]   [facility]N [facility] 11/26/2023, 2:26 PM

## 2023-11-26 NOTE — Progress Notes (Signed)
 Overnight floor coverage  Notified by RN that patient wants Klonopin  to be discontinued as it makes her dizzy.  She is requesting another medication for sleep but does not want melatonin as it gives her bad nightmares.  Patient and her daughter are requesting ramelteon  for sleep as patient has tolerated it well in the past.

## 2023-11-26 NOTE — Progress Notes (Signed)
 Orthopedic Tech Progress Note Patient Details:  Kara Tapia 10/16/1940 [id number]  Ortho Devices Type of Ortho Device: Aspen cervical collar Ortho Device/Splint Location: Had to apply pediatric c-collar. Normal collar was too large. Ortho Device/Splint Interventions: Ordered, Application, Adjustment   Post Interventions Patient Tolerated: Well Instructions Provided: Care of device  [name] 11/26/2023, 12:21 PM

## 2023-11-26 NOTE — Progress Notes (Signed)
 OT Cancellation Note  Patient Details Name: Kara Tapia MRN: [id number] DOB: [birth year]   Cancelled Treatment:    Reason Eval/Treat Not Completed: Patient at procedure or test/ unavailable (Pt bed not in room, likely at procedure. No RN present to clarify, OT to follow-up with pt as able.)  11/26/2023  AB, OTR/L  Acute Rehabilitation Services  Office: [phone]  Curtistine JONETTA Das 11/26/2023, 8:42 AM

## 2023-11-26 NOTE — Progress Notes (Signed)
 Patient off of unit for procedure

## 2023-11-26 NOTE — TOC Initial Note (Signed)
 Transition of Care [facility]) - Initial/Assessment Note    Patient Details  Name: Kara Tapia MRN: [id number] Date of Birth: August 29, 1940  Transition of Care [facility]) CM/SW Contact:    Marval Gell, RN Phone Number: 11/26/2023, 3:59 PM  Clinical Narrative:                  Beatris w patient's daughter, Grayce. Discussed needs for DC.  Requested rollator, order placed and [employer] will deliver to room Thursday.  Agreed to [facility] services. No preference for provider, Hedda able to accept     Rosi Grayce (Daughter) [phone]    Expected Discharge Plan: Home w Home Health Services Barriers to Discharge: Continued Medical Work up   Patient Goals and CMS Choice Patient states their goals for this hospitalization and ongoing recovery are:: to go home CMS Medicare.gov Compare Post Acute Care list provided to:: Patient Choice offered to / list presented to : Patient      Expected Discharge Plan and Services   Discharge Planning Services: CM Consult   Living arrangements for the past 2 months: Single Family Home                 DME Arranged: Walker rolling with seat DME Agency: [employer] Date DME Agency Contacted: 11/26/23 Time DME Agency Contacted: 1557 Representative spoke with at DME Agency: [place] HH Arranged: PT, OT HH Agency: [facility] Date [facility] Agency Contacted: 11/26/23 Time HH Agency Contacted: 1559 Representative spoke with at [facility] Agency: [place]  Prior Living Arrangements/Services Living arrangements for the past 2 months: Single Family Home                     Activities of Daily Living   ADL Screening (condition at time of admission) Independently performs ADLs?: Yes (appropriate for developmental age) Is the patient deaf or have difficulty hearing?: Yes Does the patient have difficulty seeing, even when wearing glasses/contacts?: No Does the patient have difficulty concentrating, remembering, or making decisions?: Yes  Permission  Sought/Granted                  Emotional Assessment              Admission diagnosis:  Syncope [R55] Recurrent syncope [R55] Patient Active Problem List   Diagnosis Date Noted   CAD in native artery 07/03/2023   Exertional dyspnea 07/03/2023   Chest pain 07/02/2023   Hyponatremia 07/02/2023   Mixed hyperlipidemia 01/09/2023   Mild memory disturbance 01/09/2023   Medicare annual wellness visit, subsequent 01/09/2023   Gastroesophageal reflux disease without esophagitis 01/09/2023   Elevated blood pressure reading 01/09/2023   Vertigo 01/09/2023   COPD mixed type (HCC) 01/09/2023   Hematuria 01/09/2023   Abnormal cervical Papanicolaou smear 01/09/2023   Senile osteoporosis 08/03/2021   Pain in joint of right shoulder 04/29/2019   Syncope 01/12/2019   Hypertension 01/12/2019   Lisping 12/02/2017   Sleep behavior disorder, REM 06/04/2017   MCI (mild cognitive impairment) 06/04/2017   Insomnia 07/06/2015   Nightmares REM-sleep type 07/06/2015   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   Acquired trigger finger 06/14/2015   Parasomnia, organic 10/04/2014   Migraine with status migrainosus 08/09/2014   Scotoma 08/09/2014   Intractable migraine with aura with status migrainosus 08/09/2014   REM sleep behavior disorder 08/09/2014   PCP:  Waddell Danelle ORN, MD Pharmacy:   [facility] [place], [place] - [phone]  [facility] [address] [address] [place] [address] Phone: [phone] Fax: [phone]  [facility] [address] [place] [address] Phone: [phone] Fax: [phone]     Social Drivers of Health (SDOH) Social History: SDOH Screenings   Food Insecurity: No Food Insecurity (11/26/2023)  Housing: Low Risk  (11/26/2023)  Transportation Needs: No Transportation Needs (11/26/2023)  Utilities: Not At Risk (11/26/2023)  Financial  Resource Strain: Low Risk  (11/06/2023)   Received from [facility]  Social Connections: Unknown (11/26/2023)  Tobacco Use: High Risk (11/06/2023)   Received from [facility]   SDOH Interventions:     Readmission Risk Interventions     No data to display

## 2023-11-26 NOTE — Discharge Instructions (Signed)

## 2023-11-26 NOTE — Consult Note (Addendum)
 ELECTROPHYSIOLOGY CONSULT NOTE    Patient ID: Kara Tapia MRN: [id number], DOB/AGE: 82-05-1940 82 y.o.  Admit date: 11/25/2023 Date of Consult: 11/26/2023  Primary Physician: Waddell Danelle ORN, MD Primary Cardiologist: Annabella Scarce, MD  Electrophysiologist: Dr. Waddell   Referring Provider: Dr. Raenelle  Patient Profile: Kara Tapia is a 83 y.o. female with a history of recurrent syncope, HTN who is being seen today for the evaluation of recurrent syncope at the request of Dr. Raenelle.  HPI:  Kara Tapia is a 83 y.o. female who presented to the [facility] ER on 11/25/23 after a syncopal episode.   She reports her daughter, Olam, recently passed away who had Down's Syndrome and Alzheimer's.  She has been under a significant amount of stress. She wonders if the stress of losing her daughter has impacted what is happening.  She follows with Dr. Waddell and was seen in July 2025 after a syncopal event with no real prodrome.  At that time ILR was discussed for monitoring given episodes. She also has had dizziness in the setting of vertigo that are independent of her syncopal episodes.   She has had several recent episodes > most recent where she struck her back and has seen NSGY for the same with back pain. She reports her passing out has been a recent thing but has been increasing in frequency - at least 7 times in the last month.  She was in the kitchen with the admitting episode and walking around. She notes she had eaten breakfast and believes most of the episodes to have occurred after eating.  She was walking back to her bedroom and she passed out. She notes no warning symptoms. She struck her head.  ER work up notable for C4 minimally displaced fracture of the left transverse process. MRI brain notable for small remote infarct in the left cerebellum. Tele review in ER shows normal sinus rhythm with no pauses or ectopy.   She denies chest pain, palpitations, dyspnea, PND, orthopnea, nausea,  vomiting, dizziness, syncope, edema, weight gain, or early satiety.   Labs Potassium4.1 (09/03 0549) Magnesium  1.7 (09/03 0549) Creatinine, ser  0.82 (09/03 0549) PLT  221 (09/03 0549) HGB  12.5 (09/03 0549) WBC 6.5 (09/03 0549) Troponin I (High Sensitivity)9 (09/03 0549).    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/2014   PVD (peripheral vascular disease) (HCC)    Reflux esophagitis    REM sleep behavior disorder 08/09/2014   Scotoma 08/09/2014   Sleep behavior disorder, REM 06/04/2017     Surgical History:  Past Surgical History:  Procedure Laterality Date   CARPAL TUNNEL RELEASE Right    CATARACT EXTRACTION, BILATERAL Bilateral      (Not in a hospital admission)  Inpatient Medications:   aspirin  EC  81 mg Oral Daily   clonazePAM   0.5 mg Oral QHS   cyanocobalamin   500 mcg Oral QPC breakfast   enoxaparin  (LOVENOX ) injection  40 mg Subcutaneous Q24H   ferrous sulfate   324 mg Oral QODAY   lidocaine   1 patch Transdermal Q24H   pantoprazole   40 mg Oral Daily   rosuvastatin   10 mg Oral Daily    Allergies:  Allergies  Allergen Reactions   Lidocaine  Rash and Other (See Comments)    Affected the blood pressure- almost passed out also   Gabapentin Nausea Only    Stomach  pain   Novocain [Procaine] Other (See Comments)    An injection at the dentist's caused salivation for 90+ days afterwards.    Family History  Problem Relation Age of Onset   Diabetes Mother    Heart disease Mother    Cancer Mother        type unknown   Bone cancer Father    Diabetes Sister    Lung disease Sister    COPD Sister    Heart attack Paternal Grandfather    Down syndrome Daughter    Dementia Daughter    Other Daughter        colon surgery     Physical Exam: Vitals:   11/26/23 0800 11/26/23 1001 11/26/23 1118 11/26/23 1150  BP: (!) 148/83 133/71 (!) 148/90 (!) 152/82  Pulse: (!) 56  64   Resp: 15  17 14   Temp:   98.2 F (36.8 C)   TempSrc:   Oral   SpO2:   97%   Weight:      Height:        GEN- NAD, A&O x 3, normal affect HEENT: Normocephalic, atraumatic, [place] J collar in place Lungs- CTAB, Normal effort.  Heart- Regular rate and rhythm, No M/G/R.  GI- Soft, NT, ND.  Extremities- No clubbing, cyanosis, or edema   Radiology/Studies: MR BRAIN WO CONTRAST Result Date: 11/26/2023 EXAM: MRI BRAIN WITHOUT CONTRAST 11/26/2023 08:51:54 AM TECHNIQUE: Multiplanar multisequence MRI of the head/brain was performed without the administration of intravenous contrast. COMPARISON: MRI brain 06/13/23. CLINICAL HISTORY: Neuro deficit, acute, stroke suspected; recurrent syncope, blurry vision. FINDINGS: BRAIN AND VENTRICLES: There are nonspecific hyperintense foci in the subcortical and periventricular white matter that most likely represent chronic microangiopathic ischemic changes in a patient of this age. Mild parenchymal volume loss. 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 ORBITS: Bilateral lens replacement. SINUSES AND MASTOIDS: Mild mucosal thickening in the ethmoid sinuses. BONES AND SOFT TISSUES: Normal marrow signal. No acute soft tissue abnormality. IMPRESSION: 1. No acute intracranial abnormality. 2. 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 3. Moderate  chronic microangiopathic ischemic changes. 4. Mild parenchymal volume loss. Electronically signed by: Donnice Mania MD 11/26/2023 09:48 AM EDT RP Workstation: [id number]   CT Cervical Spine Wo Contrast Result Date: 11/26/2023 EXAM: CT CERVICAL SPINE WITHOUT CONTRAST 11/26/2023 02:39:26 AM TECHNIQUE: CT of the cervical spine was performed without the administration of intravenous contrast. Multiplanar reformatted images are provided for review. Automated exposure control, iterative reconstruction, and/or weight based adjustment of the mA/kV was utilized to reduce the radiation dose to as low as reasonably achievable. COMPARISON: None available. CLINICAL HISTORY: Neck trauma (Age >= 65y). Pt has been having syncopal episodes for last couple of weeks. Has had about 7 in total. Pt remembers walking then waking up on the ground. Happened twice yesterday. May have hit head. No thinners.  Denies feeling dizzy before syncopal episode. No CP ; SOB. FINDINGS: CERVICAL SPINE: BONES AND ALIGNMENT: Age-Indeterminate minimally displaced fracture of the left transverse process of C4. DEGENERATIVE CHANGES: Multilevel mid cervical degenerative disc disease without spinal canal stenosis. SOFT TISSUES: No prevertebral soft tissue swelling. IMPRESSION: 1. Age-indeterminate minimally displaced fracture of the left transverse process of C4. 2. Multilevel mid cervical degenerative disc disease without spinal canal stenosis. Electronically signed by: Franky Stanford MD 11/26/2023 03:07 AM EDT RP Workstation: [id number]   CT Lumbar Spine Wo Contrast Result Date: 11/26/2023 EXAM: CT OF THE LUMBAR SPINE WITHOUT CONTRAST 11/26/2023 02:39:26 AM TECHNIQUE: CT of the lumbar spine was performed without the administration of intravenous contrast. Multiplanar reformatted images are provided for review. Automated exposure control, iterative reconstruction, and/or weight based adjustment of the mA/kV was utilized to reduce the radiation dose to as low  as reasonably achievable. COMPARISON: None available. CLINICAL HISTORY: Lumbar radiculopathy, trauma. Pt has been having syncopal episodes for last couple of weeks. Has had about 7 in total. Pt remembers walking then waking up on the ground. Happened twice yesterday. May have hit head. No thinners. Denies feeling dizzy before syncopal episode. No CP ; SOB. Does have tingling down the back of both thighs but that is related to spinal issues she is being seen fo FINDINGS: BONES AND ALIGNMENT: Grade 1 anterolisthesis at L4-5. No acute fracture or suspicious bone lesion. DEGENERATIVE CHANGES: Multilevel lower lumbar facet arthrosis. SOFT TISSUES: Calcific aortic atherosclerosis. No acute abnormality. IMPRESSION: 1. No acute findings. 2. Grade 1 anterolisthesis at L4-5. 3. Multilevel lower lumbar facet arthrosis. Electronically signed by: Franky Stanford MD 11/26/2023 03:04 AM EDT RP Workstation: [id number]   CT ANGIO HEAD NECK W WO CM Result Date: 11/26/2023 EXAM: CTA Head and Neck with Intravenous Contrast. CT Head without and with Contrast. CLINICAL HISTORY: Neuro deficit, acute, stroke suspected. Pt has been having syncopal episodes for last couple of weeks. Has had about 7 in total. Pt remembers walking then waking up on the ground. Happened twice yesterday. May have hit head. No thinners. Denies feeling dizzy before syncopal episode. No CP ; SOB. Does have tingling down the back of both thighs but that is related to spinal issues she is being seen fo. TECHNIQUE: Axial CTA images of the head and neck performed with intravenous contrast. MIP reconstructed images were created and reviewed. Axial computed tomography images of the head/brain performed without and with intravenous contrast. Note: Per PQRS, the description of internal carotid artery percent stenosis, including 0 percent or normal exam, is based on [place] American Symptomatic Carotid Endarterectomy Trial (NASCET) criteria. Dose reduction technique was used  including one or more of the following: automated exposure control, adjustment of mA and kV according to patient size, and/or iterative reconstruction. CONTRAST: 75mL (iohexol  (OMNIPAQUE ) 350 MG/ML injection 75 mL IOHEXOL  350 MG/ML SOLN) COMPARISON: None provided. FINDINGS: BRAIN: No acute intraparenchymal hemorrhage. No mass lesion. No CT evidence for acute territorial infarct. No midline shift or extra-axial collection. VENTRICLES: No hydrocephalus. ORBITS: The orbits are unremarkable. SINUSES AND MASTOIDS: The paranasal sinuses and mastoid air cells are clear. COMMON CAROTID ARTERIES: No significant stenosis. No dissection or occlusion. INTERNAL CAROTID ARTERIES: No stenosis by NASCET criteria. No dissection or occlusion. VERTEBRAL ARTERIES: No significant stenosis. No dissection or occlusion. ANTERIOR CEREBRAL ARTERIES: No significant stenosis. No occlusion. No aneurysm. MIDDLE CEREBRAL ARTERIES: No significant stenosis. No occlusion. No aneurysm. POSTERIOR CEREBRAL ARTERIES: No significant stenosis. No occlusion. No aneurysm. BASILAR ARTERY: No significant stenosis. No occlusion.  No aneurysm. SOFT TISSUES: No acute finding. No masses or lymphadenopathy. BONES: No acute osseous abnormality. IMPRESSION: 1. No acute intracranial hemorrhage or ischemic change. 2. No evidence of significant stenosis, aneurysmal dilatation, or dissection involving the arteries of the head and neck. Electronically signed by: Franky Stanford MD 11/26/2023 02:47 AM EDT RP Workstation: [id number]   DG Chest 2 View Result Date: 11/26/2023 CLINICAL DATA:  Syncope EXAM: CHEST - 2 VIEW COMPARISON:  07/02/2023 FINDINGS: Lungs are well expanded, symmetric, and clear. No pneumothorax or pleural effusion. Cardiac size within normal limits. Pulmonary vascularity is normal. Osseous structures are age-appropriate. No acute bone abnormality. IMPRESSION: 1. No active cardiopulmonary disease. Electronically Signed   By: Dorethia Molt M.D.   On:  11/26/2023 01:21   MR LUMBAR SPINE WO CONTRAST Result Date: 10/27/2023 EXAM: MRI LUMBAR SPINE 10/27/2023 06:58:13 PM TECHNIQUE: Multiplanar multisequence MRI of the lumbar spine was performed without the administration of intravenous contrast. COMPARISON: 08/31/2008 CLINICAL HISTORY: Low back pain, cauda equina syndrome suspected. FINDINGS: BONES AND ALIGNMENT: Grade 1 anterolisthesis at L4-5. SPINAL CORD: The conus terminates normally. SOFT TISSUES: No paraspinal mass. L1-L2: Small disc bulge. No stenosis. L2-L3: Small disc bulge. No stenosis. L3-L4: Intermediate sized disc bulge with left greater than right lateral recess stenosis and mild left foraminal stenosis. L4-L5: Severe facet arthrosis with grade 1 anterolisthesis and disc uncovering. Severe spinal canal stenosis. L5-S1: Moderate facet arthrosis without stenosis. IMPRESSION: 1. L3-4: Intermediate sized disc bulge with left greater than right lateral recess stenosis and mild left foraminal stenosis. 2. L4-5: Severe facet arthrosis with grade 1 anterolisthesis and disc uncovering, resulting in mildly progressed severe spinal canal stenosis. 3. L5-S1: Moderate facet arthrosis without stenosis. Electronically signed by: Franky Stanford MD 10/27/2023 09:21 PM EDT RP Workstation: [id number]   CT ABDOMEN PELVIS W CONTRAST Result Date: 10/27/2023 EXAM: CT ABDOMEN AND PELVIS WITH CONTRAST 10/27/2023 07:29:00 PM TECHNIQUE: CT of the abdomen and pelvis was performed with the administration of intravenous contrast. Multiplanar reformatted images are provided for review. Automated exposure control, iterative reconstruction, and/or weight based adjustment of the mA/kV was utilized to reduce the radiation dose to as low as reasonably achievable. COMPARISON: 02/12/16. CLINICAL HISTORY: Bowel obstruction suspected. Pt to er, pt states that she has some peri anal pain and incontinent. States that she has been having the pain since last week, and the incontinent episodes  for the past couple of days. States that she is also having some dizziness. FINDINGS: LOWER CHEST: No acute abnormality. LIVER: Hepatic steatosis. GALLBLADDER AND BILE DUCTS: Gallbladder is unremarkable. No biliary ductal dilatation. SPLEEN: No acute abnormality. PANCREAS: No acute abnormality. ADRENAL GLANDS: No acute abnormality. KIDNEYS, URETERS AND BLADDER: No stones in the kidneys or ureters. No hydronephrosis. No perinephric or periureteral stranding. Urinary bladder is unremarkable. GI AND BOWEL: Extensive colonic diverticulosis without diverticulitis. Normal appendix. No bowel obstruction. No bowel wall thickening. PERITONEUM AND RETROPERITONEUM: No ascites. No free air. VASCULATURE: Advanced aortic atherosclerosis. LYMPH NODES: No lymphadenopathy. REPRODUCTIVE ORGANS: Calcified uterine fibroids. BONES AND SOFT TISSUES: No acute osseous abnormality. No focal soft tissue abnormality. IMPRESSION: 1. No bowel obstruction. 2. Extensive colonic diverticulosis without diverticulitis. Electronically signed by: Norman Gatlin MD 10/27/2023 08:20 PM EDT RP Workstation: [id number]    EKG: 9/2 NSR 86 bpm (personally reviewed)  TELEMETRY: SR 70-80's, no pauses or ectopy (personally reviewed)  DEVICE HISTORY: n/a   Assessment/Plan:  Recurrent Syncope  Unclear cause - given no prodrome, arrhythmia would need to be ruled out.  Her subjective hx of  passing out shortly after eating also raises possibility of vasovagal event (though no strips to review / support). She is not on any AV nodal blocking agents. Query if some of her dizziness is related to remote cerebellar infarct.   -continue tele monitoring while inpatient  -tele and EKG with NSR, no events thus far -plan for ILR for definitive rhythm monitoring of rhythm and to correlate with syncopal events. -plan for EP follow up at discharge    For questions or updates, please contact [facility] Please consult [email or web address] for contact info  under     Signed, Daphne Barrack, NP-C, AGACNP-BC [facility] - Electrophysiology  11/26/2023, 1:44 PM  EP Attending Patient seen and examined. Agree with above. She is well known to me from prior clinic visit who is admitted for syncope. See above. The patient is in a neck brace. CV with a RRR and lungs are clear. Ext are warm. Tele with NSR.  A/P Unexplained syncope - I have discussed the indications/risks/benefits/goals/expectations of ILR insertion and she wishes to proceed. Danelle Waddell COME.

## 2023-11-26 NOTE — Evaluation (Signed)
 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 CTH negative for acute abnormalities. CT Cervical Spine shows age-indeterminate minimally displaced fracture of the L transverse process of C4. PMH of vertigo    Assessment / Plan / Recommendation  Clinical Impression  Pt endorses globus sensation with liquids more than solids in addition to persistent odynophagia. She is currently in an ill-fitting cervical collar that affects her positioning for POs (RN notified). No signs of dysphagia were observed with  purees or solids and she completed the 3 oz water test without signs clinically concerning for aspiration. Discussed options for diet modification given odynophagia but pt states she would prefer a regular diet. Recommend she resume regular solids with thin liquids. She asked for automatic trays but feel that this could be changed at any time to allow her to order independently. SLP will f/u at least briefly. SLP Visit Diagnosis: Dysphagia, unspecified (R13.10)    Aspiration Risk  Mild aspiration risk    Diet Recommendation Regular;Thin liquid    Liquid Administration via: Cup;Straw Medication Administration: Whole meds with liquid Supervision: Patient able to self feed Compensations: Slow rate;Small sips/bites Postural Changes: Seated upright at 90 degrees    Other  Recommendations Oral Care Recommendations: Oral care BID     Assistance Recommended at Discharge    Functional Status Assessment Patient has had a recent decline in their functional status and demonstrates the ability to make significant improvements in function in a reasonable and predictable amount of time.  Frequency and Duration min 2x/week  1 week       Prognosis Prognosis for improved oropharyngeal function: Good Barriers to Reach Goals: Time post onset      Swallow Study   General HPI: Pt is an 83 yo female presenting to [facility] on 11/26/23 with recurrent syncopal episodes. CTH negative for acute abnormalities. CT Cervical Spine shows age-indeterminate minimally displaced fracture of the L transverse process of C4. PMH of vertigo Type of Study: Bedside Swallow Evaluation Previous Swallow Assessment: none in chart Diet Prior to this Study: NPO  Temperature Spikes Noted: No Respiratory Status: Room air History of Recent Intubation: No Behavior/Cognition: Alert;Cooperative;Pleasant mood Oral Cavity Assessment: Within Functional Limits Oral Care Completed by SLP: No Oral Cavity - Dentition: Adequate natural  dentition Vision: Functional for self-feeding Self-Feeding Abilities: Able to feed self Patient Positioning: Upright in bed Baseline Vocal Quality: Hoarse Volitional Cough: Strong Volitional Swallow: Able to elicit    Oral/Motor/Sensory Function Overall Oral Motor/Sensory Function: Within functional limits   Ice Chips Ice chips: Not tested   Thin Liquid Thin Liquid: Within functional limits Presentation: Straw;Self Fed    Nectar Thick Nectar Thick Liquid: Not tested   Honey Thick Honey Thick Liquid: Not tested   Puree Puree: Within functional limits Presentation: Spoon;Self Fed   Solid     Solid: Within functional limits Presentation: Self Fed      Damien Blumenthal, M.A., CCC-SLP Speech Language Pathology, Acute Rehabilitation Services  Secure Chat preferred [phone]  11/26/2023,11:43 AM

## 2023-11-26 NOTE — Evaluation (Addendum)
 Occupational Therapy Evaluation Patient Details Name: OLIVIA PAVELKO MRN: [id number] DOB: January 05, 1941 Today's Date: 11/26/2023   History of Present Illness   Pt is an 83 yo female presenting to [facility] on 11/26/23 with recurrent syncopal episodes. CT negative for acute abnormalities. PMH of vertigo     Clinical Impressions Pt admitted for above, PTA she lived alone and reports being ind with ADLs and mod I with mobility occasionally using a form of BUE support. Pt found to be orthostatic and symptomatic during session with OOB mobility needing CGA no AD to safely ambulate to/from bathroom. Pt demonstrated ability to complete ADLs seated with setup A and standing with CGA. Further educated pt on counter pressure exercises, limited consistent OOB activity, and use of [place] J collar although it may be too large for pt. OT to continue following pt acutely to progress pt as able. No post acute OT needs anticipated pending improved BP regulation.   Orthostatic VS for the past 24 hrs (Last 3 readings):  BP- Lying Pulse- Lying BP- Sitting Pulse- Sitting BP- Standing at 0 minutes Pulse- Standing at 0 minutes BP- Standing at 3 minutes Pulse- Standing at 3 minutes  11/26/23 1419 152/82 63 154/90 69 -- -- 128/88 71  11/26/23 1001 133/71 -- (!) 142/95 -- 123/79 -- (!) 87/58 --  11/26/23 0035 142/85 69 (!) 128/91 85 (!) 88/67 86 (!) 89/63 82        If plan is discharge home, recommend the following:   Assistance with cooking/housework;Assist for transportation     Functional Status Assessment   Patient has had a recent decline in their functional status and demonstrates the ability to make significant improvements in function in a reasonable and predictable amount of time.     Equipment Recommendations   None recommended by OT     Recommendations for Other Services         Precautions/Restrictions   Precautions Precautions: Fall Recall of Precautions/Restrictions:  Intact Precaution/Restrictions Comments: orthostatic + Required Braces or Orthoses: Cervical Brace Cervical Brace:  (not specified)     Mobility Bed Mobility Overal bed mobility: Modified Independent             General bed mobility comments: inc time needed for positional changes.    Transfers Overall transfer level: Needs assistance Equipment used: None Transfers: Sit to/from Stand Sit to Stand: Contact guard assist           General transfer comment: CGA for safety with stedying support ready prn. Pt slow to rise      Balance Overall balance assessment: Needs assistance Sitting-balance support: Feet unsupported, No upper extremity supported Sitting balance-Leahy Scale: Good     Standing balance support: During functional activity Standing balance-Leahy Scale: Fair                             ADL either performed or assessed with clinical judgement   ADL Overall ADL's : Needs assistance/impaired Eating/Feeding: Independent;Sitting   Grooming: Standing;Contact guard assist   Upper Body Bathing: Sitting;Set up   Lower Body Bathing: Sitting/lateral leans;Set up   Upper Body Dressing : Sitting;Set up   Lower Body Dressing: Set up;Sit to/from stand Lower Body Dressing Details (indicate cue type and reason): don bilat socks Toilet Transfer: Ambulation;Regular Toilet;Contact guard assist   Toileting- Clothing Manipulation and Hygiene: Contact guard assist;Sit to/from stand       Functional mobility during ADLs: Contact guard assist  Vision   Vision Assessment?: No apparent visual deficits     Perception         Praxis         Pertinent Vitals/Pain Pain Assessment Pain Assessment: Faces Faces Pain Scale: Hurts a little bit Pain Location: R hip Pain Descriptors / Indicators: Shooting, Sharp Pain Intervention(s): Monitored during session, Premedicated before session     Extremity/Trunk Assessment Upper Extremity  Assessment Upper Extremity Assessment: Overall WFL for tasks assessed   Lower Extremity Assessment Lower Extremity Assessment: Defer to PT evaluation       Communication Communication Communication: No apparent difficulties   Cognition Arousal: Alert Behavior During Therapy: WFL for tasks assessed/performed Cognition: No apparent impairments                               Following commands: Intact       Cueing  General Comments   Cueing Techniques: Verbal cues  see note for orthostatics, found to be +   Exercises     Shoulder Instructions      Home Living Family/patient expects to be discharged to:: Private residence Living Arrangements: Alone Available Help at Discharge: Family Type of Home: House       Home Layout: Multi-level;Able to live on main level with bedroom/bathroom Alternate Level Stairs-Number of Steps: basement, main, and upper levels   Bathroom Shower/Tub: Walk-in shower         Home Equipment: Shower seat - built in   Additional Comments: 6 falls recently since Aug      Prior Functioning/Environment Prior Level of Function : Driving;Independent/Modified Independent;History of Falls (last six months)             Mobility Comments: no AD, will furniture walk or use walking sticks occassionally ADLs Comments: ind    OT Problem List: Decreased activity tolerance;Impaired balance (sitting and/or standing)   OT Treatment/Interventions: Therapeutic exercise;Self-care/ADL training;Balance training;Therapeutic activities;Patient/family education      OT Goals(Current goals can be found in the care plan section)   Acute Rehab OT Goals Patient Stated Goal: To get better, go home OT Goal Formulation: With patient Time For Goal Achievement: 12/10/23 Potential to Achieve Goals: Good ADL Goals Pt Will Perform Grooming: Independently;standing Pt Will Perform Lower Body Dressing: Independently;sit to/from stand Pt Will  Transfer to Toilet: Independently;ambulating;regular height toilet Pt Will Perform Tub/Shower Transfer: Tub transfer;shower seat;Independently Additional ADL Goal #1: Pt will verbalize understanding of PRN rest breaks to reduce ongoing orthostatic syptoms with OOB mobility.   OT Frequency:  Min 2X/week    Co-evaluation              AM-PAC OT 6 Clicks Daily Activity     Outcome Measure Help from another person eating meals?: None Help from another person taking care of personal grooming?: A Little Help from another person toileting, which includes using toliet, bedpan, or urinal?: A Little Help from another person bathing (including washing, rinsing, drying)?: A Little Help from another person to put on and taking off regular upper body clothing?: A Little Help from another person to put on and taking off regular lower body clothing?: A Little 6 Click Score: 19   End of Session Equipment Utilized During Treatment: Gait belt Nurse Communication: Mobility status (pt hungry, informed her on need for MD to change NPO status)  Activity Tolerance: Patient tolerated treatment well Patient left: in bed;with call bell/phone within reach  OT Visit Diagnosis: Unsteadiness  on feet (R26.81);Other abnormalities of gait and mobility (R26.89);History of falling (Z91.81);Dizziness and giddiness (R42)                Time: 9044-8984 OT Time Calculation (min): 20 min Charges:  OT General Charges $OT Visit: 1 Visit OT Evaluation $OT Eval Moderate Complexity: 1 Mod  11/26/2023  AB, OTR/L  Acute Rehabilitation Services  Office: [phone]   Curtistine JONETTA Das 11/26/2023, 10:45 AM

## 2023-11-26 NOTE — Plan of Care (Signed)
 Patient refused ted hose and abd binder. Stated she'll put them on in the morning.  Problem: Education: Goal: Knowledge of General Education information will improve Description: Including pain rating scale, medication(s)/side effects and non-pharmacologic comfort measures Outcome: Progressing   Problem: Health Behavior/Discharge Planning: Goal: Ability to manage health-related needs will improve Outcome: Progressing   Problem: Clinical Measurements: Goal: Ability to maintain clinical measurements within normal limits will improve Outcome: Progressing Goal: Will remain free from infection Outcome: Progressing Goal: Diagnostic test results will improve Outcome: Progressing Goal: Respiratory complications will improve Outcome: Progressing Goal: Cardiovascular complication will be avoided Outcome: Progressing   Problem: Activity: Goal: Risk for activity intolerance will decrease Outcome: Progressing   Problem: Nutrition: Goal: Adequate nutrition will be maintained Outcome: Progressing   Problem: Coping: Goal: Level of anxiety will decrease Outcome: Progressing   Problem: Elimination: Goal: Will not experience complications related to bowel motility Outcome: Progressing Goal: Will not experience complications related to urinary retention Outcome: Progressing   Problem: Pain Managment: Goal: General experience of comfort will improve and/or be controlled Outcome: Progressing   Problem: Safety: Goal: Ability to remain free from injury will improve Outcome: Progressing   Problem: Skin Integrity: Goal: Risk for impaired skin integrity will decrease Outcome: Progressing

## 2023-11-26 NOTE — Progress Notes (Signed)
Patient returned to unit from procedure.

## 2023-11-26 NOTE — Progress Notes (Signed)
 Orthopedic Tech Progress Note Patient Details:  Kara Tapia 21-Apr-1940 [id number]  Patient ID: Kara Tapia, female   DOB: 1941-01-11, 83 y.o.   MRN: [id number] Kara Tapia 11/26/2023, 2:29 PM Pediatric C-Collar was placed on Pt. In ED by this ortho tech.

## 2023-11-27 ENCOUNTER — Encounter (HOSPITAL_COMMUNITY): Payer: Self-pay | Admitting: Internal Medicine

## 2023-11-27 ENCOUNTER — Other Ambulatory Visit (HOSPITAL_COMMUNITY): Payer: Self-pay

## 2023-11-27 DIAGNOSIS — I10 Essential (primary) hypertension: Secondary | ICD-10-CM

## 2023-11-27 DIAGNOSIS — R55 Syncope and collapse: Secondary | ICD-10-CM | POA: Diagnosis not present

## 2023-11-27 LAB — BASIC METABOLIC PANEL WITH GFR
Anion gap: 12 (ref 5–15)
BUN: 12 mg/dL (ref 8–23)
CO2: 19 mmol/L — ABNORMAL LOW (ref 22–32)
Calcium: 8.2 mg/dL — ABNORMAL LOW (ref 8.9–10.3)
Chloride: 108 mmol/L (ref 98–111)
Creatinine, Ser: 0.78 mg/dL (ref 0.44–1.00)
GFR, Estimated: >60 mL/min (ref >60)
Glucose, Bld: 99 mg/dL (ref 70–99)
Potassium: 4.4 mmol/L (ref 3.5–5.1)
Sodium: 139 mmol/L (ref 135–145)

## 2023-11-27 LAB — CBC
HCT: 35.2 % — ABNORMAL LOW (ref 36.0–46.0)
Hemoglobin: 11.9 g/dL — ABNORMAL LOW (ref 12.0–15.0)
MCH: 33.7 pg (ref 26.0–34.0)
MCHC: 33.8 g/dL (ref 30.0–36.0)
MCV: 99.7 fL (ref 80.0–100.0)
Platelets: 215 K/uL (ref 150–400)
RBC: 3.53 MIL/uL — ABNORMAL LOW (ref 3.87–5.11)
RDW: 13.2 % (ref 11.5–15.5)
WBC: 5.7 K/uL (ref 4.0–10.5)
nRBC: 0 % (ref 0.0–0.2)

## 2023-11-27 MED ORDER — HYDRALAZINE HCL 20 MG/ML IJ SOLN
5.0000 mg | Freq: Four times a day (QID) | INTRAMUSCULAR | Status: DC | PRN
  Filled 2023-11-27: qty 1

## 2023-11-27 MED FILL — Lidocaine Inj 1% w/ Epinephrine-1:100000: INTRAMUSCULAR | Qty: 20 | Status: AC

## 2023-11-27 NOTE — Progress Notes (Addendum)
  Patient Name: Kara Tapia Date of Encounter: 11/27/2023  Primary Cardiologist: Annabella Scarce, MD Electrophysiologist: None  Interval Summary   The patient reports she is doing well. Planning to go home with her daughter at discharge.  At this time, the patient denies chest pain, shortness of breath, or any new concerns.  Vital Signs    Vitals:   11/27/23 0206 11/27/23 0400 11/27/23 0636 11/27/23 0752  BP: (!) 145/98 (!) 182/81 (!) 172/88   Pulse: 69 (!) 56 (!) 56 60  Resp: 19 12 19 19   Temp: 98.3 F (36.8 C) 98.4 F (36.9 C)  (!) 97.4 F (36.3 C)  TempSrc: Oral Oral  Oral  SpO2: 95% 96% 97%   Weight:      Height:        Intake/Output Summary (Last 24 hours) at 11/27/2023 9160 Last data filed at 11/26/2023 1500 Gross per 24 hour  Intake 380 ml  Output --  Net 380 ml   Filed Weights   11/25/23 2240  Weight: 57.2 kg    Physical Exam    GEN- NAD, Alert and oriented  Lungs- Clear to ausculation bilaterally, normal work of breathing Cardiac- Regular rate and rhythm, no murmurs, rubs or gallops GI- soft, NT, ND, + BS Extremities- no clubbing or cyanosis. No edema  Telemetry    SB 50's - SR 80's, no pauses overnight (personally reviewed)  Hospital Course    Kara Tapia is a 83 y.o. female with history of recurrent syncope, HTN admitted 11/26/23 for recurrent syncope s/p cervical spine fracture.  ILR implanted  Assessment & Plan    Recurrent Syncope Unclear cause of syncope, no arrhythmia identified as of yet on monitoring.  Subjective hx of passing out after eating but not clear.  She is not on any AV nodal blocking agents. Query if some of her dizziness issues are related to remote cerebellar infarct (dizzy spells do not precede syncope / unrelated).   -tele monitoring while inpatient  -no events on monitoring while inpatient  -post ILR, instructions reviewed with patient for care of site  -EP follow up arranged   EP will be available PRN.  Please call  back if new needs arise.     For questions or updates, please contact [facility] Please consult [email or web address] for contact info under     Signed, Daphne Barrack, NP-C, AGACNP-BC [facility] - Electrophysiology  11/27/2023, 8:48 AM  EP Attending  Patient seen and examined. She is stable after ILR insertion and had no arrhythmias over night. She is ok to discharge home. She will undergo watchful waiting. Hopefully her syncope will resolve.   Danelle Victoire Deans,MD

## 2023-11-27 NOTE — Discharge Summary (Signed)
 PATIENT DETAILS Name: Kara Tapia Age: 83 y.o. Sex: female Date of Birth: June 30, 1940 MRN: [id number]. Admitting Physician: Dorn Dawson, MD ERE:Ujbonm, Danelle ORN, MD  Admit Date: 11/25/2023 Discharge date: 11/27/2023  Recommendations for Outpatient Follow-up:  Follow up with PCP in 1-2 weeks Please obtain CMP/CBC in one week  Admitted From:  Home  Disposition: Home   Discharge Condition: good  CODE STATUS:   Code Status: Limited: Do not attempt resuscitation (DNR) -DNR-LIMITED -Do Not Intubate/DNI    Diet recommendation:  Diet Order             Diet - low sodium heart healthy           Diet regular Room service appropriate? No; Fluid consistency: Thin  Diet effective now                    Brief Summary: 83 year old female with history of HTN, HLD, cognitive dysfunction-longstanding history of syncope-presented with 2 episodes of loss of consciousness in the past 2 days.  She was found to be orthostatic in the ED-she was subsequently admitted to the hospitalist service.  Significant studies 9/3>> CXR: No pneumonia 9/3>> CT C-spine: Age-indeterminate minimally displaced fracture of left transverse process of C4 9/3>> CT L-spine: No fracture. 9/3>> CT angio head/neck: No intracranial abnormalities-no significant stenosis/dissection/dilatation. 9/3>> MRI brain:No acute intracranial abnormality.   Brief Hospital Course: Syncope Although she was orthostatic-not sure this was the primary mechanism-she has not had extensive workup in the past including echo/Holter monitor/EEG which were all unrevealing.  She was admitted given IV fluids-on repeat orthostatic vital signs checked by PT-this was negative.  She is now ambulating in the room without any issues.  She was evaluated by EP service-underwent loop recorder placement. She will follow-up with EP/cardiology for further continued care  Hyponatremia Mild Probably secondary to subtle dehydration-which could have  contributed to orthostatic vital signs that were positive on initial presentation to the ED. Resolved with IVF.  Age-indeterminate transverse process fracture at C4 Neurologically intact CTA negative for vascular injury Evaluated by Dr. Debby of neurosurgery-recommend patient's for c-collar, for 2-4 weeks-and can stop-neck feels better.  Outpatient follow-up in 6 weeks.  History of HTN Only takes hydralazine  as needed if blood pressures more than 150 Continue to allow permissive hypertension   HLD Statin   Cognitive dysfunction Appears to have some mild memory impairment Continue Aricept .   Chronic low back pain Reportedly has lumbar spinal stenosis-follows with neurosurgery Apparently has steroid epidural injections in the past that has improved the pain Currently pain is at baseline.   Grief/bereavement Recent loss of child   Discharge Diagnoses:  Principal Problem:   Syncope   Discharge Instructions:  Activity:  As tolerated with Full fall precautions use walker/cane & assistance as needed  Discharge Instructions     Call MD for:  difficulty breathing, headache or visual disturbances   Complete by: As directed    Call MD for:  extreme fatigue   Complete by: As directed    Call MD for:  persistant dizziness or light-headedness   Complete by: As directed    Diet - low sodium heart healthy   Complete by: As directed    Discharge instructions   Complete by: As directed    Follow with Primary MD  Waddell Danelle ORN, MD in 1-2 weeks  Please do not drive or operate heavy machinery-due to unexplained syncope.  Please get a complete blood count and chemistry panel checked by your  Primary MD at your next visit, and again as instructed by your Primary MD.  Get Medicines reviewed and adjusted: Please take all your medications with you for your next visit with your Primary MD  Laboratory/radiological data: Please request your Primary MD to go over all hospital tests  and procedure/radiological results at the follow up, please ask your Primary MD to get all Hospital records sent to his/her office.  In some cases, they will be blood work, cultures and biopsy results pending at the time of your discharge. Please request that your primary care M.D. follows up on these results.  Also Note the following: If you experience worsening of your admission symptoms, develop shortness of breath, life threatening emergency, suicidal or homicidal thoughts you must seek medical attention immediately by calling 911 or calling your MD immediately  if symptoms less severe.  You must read complete instructions/literature along with all the possible adverse reactions/side effects for all the Medicines you take and that have been prescribed to you. Take any new Medicines after you have completely understood and accpet all the possible adverse reactions/side effects.   Do not drive when taking Pain medications or sleeping medications (Benzodaizepines)  Do not take more than prescribed Pain, Sleep and Anxiety Medications. It is not advisable to combine anxiety,sleep and pain medications without talking with your primary care practitioner  Special Instructions: If you have smoked or chewed Tobacco  in the last 2 yrs please stop smoking, stop any regular Alcohol   and or any Recreational drug use.  Wear Seat belts while driving.  Please note: You were cared for by a hospitalist during your hospital stay. Once you are discharged, your primary care physician will handle any further medical issues. Please note that NO REFILLS for any discharge medications will be authorized once you are discharged, as it is imperative that you return to your primary care physician (or establish a relationship with a primary care physician if you do not have one) for your post hospital discharge needs so that they can reassess your need for medications and monitor your lab values.   Increase activity slowly    Complete by: As directed       Allergies as of 11/27/2023       Reactions   Lidocaine  Rash, Other (See Comments)   Affected the blood pressure- almost passed out also   Gabapentin Nausea Only   Stomach pain   Novocain [procaine] Other (See Comments)   An injection at the dentist's caused salivation for 90+ days afterwards.        Medication List     TAKE these medications    acetaminophen  500 MG tablet Commonly known as: TYLENOL  Take 500-1,000 mg by mouth every 6 (six) hours as needed for mild pain (pain score 1-3).   albuterol  108 (90 Base) MCG/ACT inhaler Commonly known as: VENTOLIN  HFA Inhale 2 puffs into the lungs every 4 (four) hours as needed for wheezing or shortness of breath.   amLODipine  2.5 MG tablet Commonly known as: NORVASC  Take 2.5 mg by mouth daily as needed (Systolic blood pressure >150).   aspirin  EC 81 MG tablet Take 81 mg by mouth daily.   CALCIUM  PO Take 1 tablet by mouth daily after breakfast.   clonazePAM  0.5 MG tablet Commonly known as: KLONOPIN  Take 0.5 tablets (0.25 mg total) by mouth at bedtime. What changed:  when to take this reasons to take this   donepezil  10 MG tablet Commonly known as: ARICEPT  Take 1 tablet (  10 mg total) by mouth daily.   ibuprofen  200 MG tablet Commonly known as: ADVIL  Take 600-800 mg by mouth 2 (two) times daily as needed for mild pain (pain score 1-3).   ipratropium 0.06 % nasal spray Commonly known as: ATROVENT  Place 2 sprays into the nose daily.   lidocaine  5 % Commonly known as: Lidoderm  Place 1 patch onto the skin daily. Remove & Discard patch within 12 hours or as directed by MD   MAGNESIUM PO Take 1 tablet by mouth daily after breakfast.   meclizine  12.5 MG tablet Commonly known as: ANTIVERT  Take 12.5 mg by mouth 3 (three) times daily as needed for dizziness.   pregabalin 75 MG capsule Commonly known as: LYRICA Take 75 mg by mouth 2 (two) times daily.   rosuvastatin  10 MG  tablet Commonly known as: CRESTOR  Take 10 mg by mouth daily.   VITAMIN B12 PO Take 1 tablet by mouth daily after breakfast.   VITAMIN D3 PO Take 1 capsule by mouth daily after breakfast.               Durable Medical Equipment  (From admission, onward)           Start     Ordered   11/26/23 1559  For home use only DME 4 wheeled rolling walker with seat  Once       Question:  Patient needs a walker to treat with the following condition  Answer:  Weakness   11/26/23 1559            Follow-up Information     [facility].   Specialty: Home Health Services Contact information: [address] [place] [address] [phone]         Waddell Danelle ORN, MD. Schedule an appointment as soon as possible for a visit in 1 week(s).   Specialty: Cardiology Contact information: [address] [place] [address] [phone]         Louis Shove, MD. Schedule an appointment as soon as possible for a visit in 2 week(s).   Specialty: Neurosurgery Contact information: [address] [place] [address] [phone]                Allergies  Allergen Reactions   Lidocaine  Rash and Other (See Comments)    Affected the blood pressure- almost passed out also   Gabapentin Nausea Only    Stomach pain   Novocain [Procaine] Other (See Comments)    An injection at the dentist's caused salivation for 90+ days afterwards.     Other Procedures/Studies: EP PPM/ICD IMPLANT Result Date: 11/26/2023 CONCLUSIONS:  1. Successful implantation of a implantable loop recorder for a history of Syncope.  2. No early apparent complications. Danelle Waddell, MD Cardiac Electrophysiology    MR BRAIN WO CONTRAST Result Date: 11/26/2023 EXAM: MRI BRAIN WITHOUT CONTRAST 11/26/2023 08:51:54 AM TECHNIQUE: Multiplanar multisequence MRI of the head/brain was performed without the administration of intravenous contrast.  COMPARISON: MRI brain 06/13/23. CLINICAL HISTORY: Neuro deficit, acute, stroke suspected; recurrent syncope, blurry vision. FINDINGS: BRAIN AND VENTRICLES: There are nonspecific hyperintense foci in the subcortical and periventricular white matter that most likely represent chronic microangiopathic ischemic changes in a patient of this age. Mild parenchymal volume loss. 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 ORBITS: Bilateral lens replacement. SINUSES AND MASTOIDS: Mild mucosal thickening in the ethmoid sinuses. BONES AND SOFT TISSUES: Normal marrow signal. No acute soft tissue  abnormality. IMPRESSION: 1. No acute intracranial abnormality. 2. 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 3. Moderate chronic microangiopathic ischemic changes. 4. Mild parenchymal volume loss. Electronically signed by: Donnice Mania MD 11/26/2023 09:48 AM EDT RP Workstation: [id number]   CT Cervical Spine Wo Contrast Result Date: 11/26/2023 EXAM: CT CERVICAL SPINE WITHOUT CONTRAST 11/26/2023 02:39:26 AM TECHNIQUE: CT of the cervical spine was performed without the administration of intravenous contrast. Multiplanar reformatted images are provided for review. Automated exposure control, iterative reconstruction, and/or weight based adjustment of the mA/kV was utilized to reduce the radiation dose to as low as reasonably achievable. COMPARISON: None available. CLINICAL HISTORY: Neck trauma (Age >= 65y). Pt has been having syncopal episodes for last couple of weeks. Has had about 7 in total. Pt remembers walking then waking up on the ground. Happened twice yesterday. May have hit head. No thinners. Denies feeling dizzy before syncopal episode. No CP ; SOB. FINDINGS: CERVICAL SPINE: BONES AND ALIGNMENT: Age-Indeterminate minimally displaced fracture of the left transverse process of C4. DEGENERATIVE CHANGES: Multilevel mid cervical degenerative disc disease without spinal canal stenosis. SOFT TISSUES: No prevertebral soft  tissue swelling. IMPRESSION: 1. Age-indeterminate minimally displaced fracture of the left transverse process of C4. 2. Multilevel mid cervical degenerative disc disease without spinal canal stenosis. Electronically signed by: Franky Stanford MD 11/26/2023 03:07 AM EDT RP Workstation: [id number]   CT Lumbar Spine Wo Contrast Result Date: 11/26/2023 EXAM: CT OF THE LUMBAR SPINE WITHOUT CONTRAST 11/26/2023 02:39:26 AM TECHNIQUE: CT of the lumbar spine was performed without the administration of intravenous contrast. Multiplanar reformatted images are provided for review. Automated exposure control, iterative reconstruction, and/or weight based adjustment of the mA/kV was utilized to reduce the radiation dose to as low as reasonably achievable. COMPARISON: None available. CLINICAL HISTORY: Lumbar radiculopathy, trauma. Pt has been having syncopal episodes for last couple of weeks. Has had about 7 in total. Pt remembers walking then waking up on the ground. Happened twice yesterday. May have hit head. No thinners. Denies feeling dizzy before syncopal episode. No CP ; SOB. Does have tingling down the back of both thighs but that is related to spinal issues she is being seen fo FINDINGS: BONES AND ALIGNMENT: Grade 1 anterolisthesis at L4-5. No acute fracture or suspicious bone lesion. DEGENERATIVE CHANGES: Multilevel lower lumbar facet arthrosis. SOFT TISSUES: Calcific aortic atherosclerosis. No acute abnormality. IMPRESSION: 1. No acute findings. 2. Grade 1 anterolisthesis at L4-5. 3. Multilevel lower lumbar facet arthrosis. Electronically signed by: Franky Stanford MD 11/26/2023 03:04 AM EDT RP Workstation: [id number]   CT ANGIO HEAD NECK W WO CM Result Date: 11/26/2023 EXAM: CTA Head and Neck with Intravenous Contrast. CT Head without and with Contrast. CLINICAL HISTORY: Neuro deficit, acute, stroke suspected. Pt has been having syncopal episodes for last couple of weeks. Has had about 7 in total. Pt remembers walking  then waking up on the ground. Happened twice yesterday. May have hit head. No thinners. Denies feeling dizzy before syncopal episode. No CP ; SOB. Does have tingling down the back of both thighs but that is related to spinal issues she is being seen fo. TECHNIQUE: Axial CTA images of the head and neck performed with intravenous contrast. MIP reconstructed images were created and reviewed. Axial computed tomography images of the head/brain performed without and with intravenous contrast. Note: Per PQRS, the description of internal carotid artery percent stenosis, including 0 percent or normal exam, is based on [place] Symptomatic Carotid Endarterectomy Trial (NASCET) criteria. Dose reduction technique was  used including one or more of the following: automated exposure control, adjustment of mA and kV according to patient size, and/or iterative reconstruction. CONTRAST: 75mL (iohexol  (OMNIPAQUE ) 350 MG/ML injection 75 mL IOHEXOL  350 MG/ML SOLN) COMPARISON: None provided. FINDINGS: BRAIN: No acute intraparenchymal hemorrhage. No mass lesion. No CT evidence for acute territorial infarct. No midline shift or extra-axial collection. VENTRICLES: No hydrocephalus. ORBITS: The orbits are unremarkable. SINUSES AND MASTOIDS: The paranasal sinuses and mastoid air cells are clear. COMMON CAROTID ARTERIES: No significant stenosis. No dissection or occlusion. INTERNAL CAROTID ARTERIES: No stenosis by NASCET criteria. No dissection or occlusion. VERTEBRAL ARTERIES: No significant stenosis. No dissection or occlusion. ANTERIOR CEREBRAL ARTERIES: No significant stenosis. No occlusion. No aneurysm. MIDDLE CEREBRAL ARTERIES: No significant stenosis. No occlusion. No aneurysm. POSTERIOR CEREBRAL ARTERIES: No significant stenosis. No occlusion. No aneurysm. BASILAR ARTERY: No significant stenosis. No occlusion. No aneurysm. SOFT TISSUES: No acute finding. No masses or lymphadenopathy. BONES: No acute osseous abnormality.  IMPRESSION: 1. No acute intracranial hemorrhage or ischemic change. 2. No evidence of significant stenosis, aneurysmal dilatation, or dissection involving the arteries of the head and neck. Electronically signed by: Franky Stanford MD 11/26/2023 02:47 AM EDT RP Workstation: [id number]   DG Chest 2 View Result Date: 11/26/2023 CLINICAL DATA:  Syncope EXAM: CHEST - 2 VIEW COMPARISON:  07/02/2023 FINDINGS: Lungs are well expanded, symmetric, and clear. No pneumothorax or pleural effusion. Cardiac size within normal limits. Pulmonary vascularity is normal. Osseous structures are age-appropriate. No acute bone abnormality. IMPRESSION: 1. No active cardiopulmonary disease. Electronically Signed   By: Dorethia Molt M.D.   On: 11/26/2023 01:21     TODAY-DAY OF DISCHARGE:  Subjective:   Jonika Critz today has no headache,no chest abdominal pain,no new weakness tingling or numbness, feels much better wants to go home today.   Objective:   Blood pressure (!) 179/93, pulse 60, temperature (!) 97.4 F (36.3 C), temperature source Oral, resp. rate 19, height 5' 6 (1.676 m), weight 57.2 kg, SpO2 99%.  Intake/Output Summary (Last 24 hours) at 11/27/2023 0857 Last data filed at 11/26/2023 1500 Gross per 24 hour  Intake 380 ml  Output --  Net 380 ml   Filed Weights   11/25/23 2240  Weight: 57.2 kg    Exam: Awake Alert, Oriented *3, No new F.N deficits, Normal affect [place].AT,PERRAL Supple Neck,No JVD, No cervical lymphadenopathy appriciated.  Symmetrical Chest wall movement, Good air movement bilaterally, CTAB RRR,No Gallops,Rubs or new Murmurs, No Parasternal Heave +ve B.Sounds, Abd Soft, Non tender, No organomegaly appriciated, No rebound -guarding or rigidity. No Cyanosis, Clubbing or edema, No new Rash or bruise   PERTINENT RADIOLOGIC STUDIES: EP PPM/ICD IMPLANT Result Date: 11/26/2023 CONCLUSIONS:  1. Successful implantation of a implantable loop recorder for a history of Syncope.  2. No early  apparent complications. Danelle Birmingham, MD Cardiac Electrophysiology    MR BRAIN WO CONTRAST Result Date: 11/26/2023 EXAM: MRI BRAIN WITHOUT CONTRAST 11/26/2023 08:51:54 AM TECHNIQUE: Multiplanar multisequence MRI of the head/brain was performed without the administration of intravenous contrast. COMPARISON: MRI brain 06/13/23. CLINICAL HISTORY: Neuro deficit, acute, stroke suspected; recurrent syncope, blurry vision. FINDINGS: BRAIN AND VENTRICLES: There are nonspecific hyperintense foci in the subcortical and periventricular white matter that most likely represent chronic microangiopathic ischemic changes in a patient of this age. Mild parenchymal volume loss. 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 ORBITS: Bilateral lens replacement. SINUSES AND MASTOIDS: Mild mucosal thickening in the ethmoid sinuses. BONES AND SOFT TISSUES: Normal marrow signal.  No acute soft tissue abnormality. IMPRESSION: 1. No acute intracranial abnormality. 2. 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 3. Moderate chronic microangiopathic ischemic changes. 4. Mild parenchymal volume loss. Electronically signed by: Donnice Mania MD 11/26/2023 09:48 AM EDT RP Workstation: [id number]   CT Cervical Spine Wo Contrast Result Date: 11/26/2023 EXAM: CT CERVICAL SPINE WITHOUT CONTRAST 11/26/2023 02:39:26 AM TECHNIQUE: CT of the cervical spine was performed without the administration of intravenous contrast. Multiplanar reformatted images are provided for review. Automated exposure control, iterative reconstruction, and/or weight based adjustment of the mA/kV was utilized to reduce the radiation dose to as low as reasonably achievable. COMPARISON: None available. CLINICAL HISTORY: Neck trauma (Age >= 65y). Pt has been having syncopal episodes for last couple of weeks. Has had about 7 in total. Pt remembers walking then waking up on the ground. Happened twice yesterday. May have hit head. No thinners. Denies feeling dizzy before  syncopal episode. No CP ; SOB. FINDINGS: CERVICAL SPINE: BONES AND ALIGNMENT: Age-Indeterminate minimally displaced fracture of the left transverse process of C4. DEGENERATIVE CHANGES: Multilevel mid cervical degenerative disc disease without spinal canal stenosis. SOFT TISSUES: No prevertebral soft tissue swelling. IMPRESSION: 1. Age-indeterminate minimally displaced fracture of the left transverse process of C4. 2. Multilevel mid cervical degenerative disc disease without spinal canal stenosis. Electronically signed by: Franky Stanford MD 11/26/2023 03:07 AM EDT RP Workstation: [id number]   CT Lumbar Spine Wo Contrast Result Date: 11/26/2023 EXAM: CT OF THE LUMBAR SPINE WITHOUT CONTRAST 11/26/2023 02:39:26 AM TECHNIQUE: CT of the lumbar spine was performed without the administration of intravenous contrast. Multiplanar reformatted images are provided for review. Automated exposure control, iterative reconstruction, and/or weight based adjustment of the mA/kV was utilized to reduce the radiation dose to as low as reasonably achievable. COMPARISON: None available. CLINICAL HISTORY: Lumbar radiculopathy, trauma. Pt has been having syncopal episodes for last couple of weeks. Has had about 7 in total. Pt remembers walking then waking up on the ground. Happened twice yesterday. May have hit head. No thinners. Denies feeling dizzy before syncopal episode. No CP ; SOB. Does have tingling down the back of both thighs but that is related to spinal issues she is being seen fo FINDINGS: BONES AND ALIGNMENT: Grade 1 anterolisthesis at L4-5. No acute fracture or suspicious bone lesion. DEGENERATIVE CHANGES: Multilevel lower lumbar facet arthrosis. SOFT TISSUES: Calcific aortic atherosclerosis. No acute abnormality. IMPRESSION: 1. No acute findings. 2. Grade 1 anterolisthesis at L4-5. 3. Multilevel lower lumbar facet arthrosis. Electronically signed by: Franky Stanford MD 11/26/2023 03:04 AM EDT RP Workstation: [id number]   CT  ANGIO HEAD NECK W WO CM Result Date: 11/26/2023 EXAM: CTA Head and Neck with Intravenous Contrast. CT Head without and with Contrast. CLINICAL HISTORY: Neuro deficit, acute, stroke suspected. Pt has been having syncopal episodes for last couple of weeks. Has had about 7 in total. Pt remembers walking then waking up on the ground. Happened twice yesterday. May have hit head. No thinners. Denies feeling dizzy before syncopal episode. No CP ; SOB. Does have tingling down the back of both thighs but that is related to spinal issues she is being seen fo. TECHNIQUE: Axial CTA images of the head and neck performed with intravenous contrast. MIP reconstructed images were created and reviewed. Axial computed tomography images of the head/brain performed without and with intravenous contrast. Note: Per PQRS, the description of internal carotid artery percent stenosis, including 0 percent or normal exam, is based on [place] Symptomatic Carotid Endarterectomy Trial (NASCET) criteria.  Dose reduction technique was used including one or more of the following: automated exposure control, adjustment of mA and kV according to patient size, and/or iterative reconstruction. CONTRAST: 75mL (iohexol  (OMNIPAQUE ) 350 MG/ML injection 75 mL IOHEXOL  350 MG/ML SOLN) COMPARISON: None provided. FINDINGS: BRAIN: No acute intraparenchymal hemorrhage. No mass lesion. No CT evidence for acute territorial infarct. No midline shift or extra-axial collection. VENTRICLES: No hydrocephalus. ORBITS: The orbits are unremarkable. SINUSES AND MASTOIDS: The paranasal sinuses and mastoid air cells are clear. COMMON CAROTID ARTERIES: No significant stenosis. No dissection or occlusion. INTERNAL CAROTID ARTERIES: No stenosis by NASCET criteria. No dissection or occlusion. VERTEBRAL ARTERIES: No significant stenosis. No dissection or occlusion. ANTERIOR CEREBRAL ARTERIES: No significant stenosis. No occlusion. No aneurysm. MIDDLE CEREBRAL ARTERIES: No  significant stenosis. No occlusion. No aneurysm. POSTERIOR CEREBRAL ARTERIES: No significant stenosis. No occlusion. No aneurysm. BASILAR ARTERY: No significant stenosis. No occlusion. No aneurysm. SOFT TISSUES: No acute finding. No masses or lymphadenopathy. BONES: No acute osseous abnormality. IMPRESSION: 1. No acute intracranial hemorrhage or ischemic change. 2. No evidence of significant stenosis, aneurysmal dilatation, or dissection involving the arteries of the head and neck. Electronically signed by: Franky Stanford MD 11/26/2023 02:47 AM EDT RP Workstation: [id number]   DG Chest 2 View Result Date: 11/26/2023 CLINICAL DATA:  Syncope EXAM: CHEST - 2 VIEW COMPARISON:  07/02/2023 FINDINGS: Lungs are well expanded, symmetric, and clear. No pneumothorax or pleural effusion. Cardiac size within normal limits. Pulmonary vascularity is normal. Osseous structures are age-appropriate. No acute bone abnormality. IMPRESSION: 1. No active cardiopulmonary disease. Electronically Signed   By: Dorethia Molt M.D.   On: 11/26/2023 01:21     PERTINENT LAB RESULTS: CBC: Recent Labs    11/26/23 0549 11/27/23 0457  WBC 6.5 5.7  HGB 12.5 11.9*  HCT 36.7 35.2*  PLT 221 215   CMET CMP     Component Value Date/Time   NA 139 11/27/2023 0457   NA 135 05/21/2023 0932   K 4.4 11/27/2023 0457   CL 108 11/27/2023 0457   CO2 19 (L) 11/27/2023 0457   GLUCOSE 99 11/27/2023 0457   BUN 12 11/27/2023 0457   BUN 12 05/21/2023 0932   CREATININE 0.78 11/27/2023 0457   CALCIUM  8.2 (L) 11/27/2023 0457   PROT 5.8 (L) 11/25/2023 2253   PROT 6.6 07/30/2023 1624   ALBUMIN 3.5 11/25/2023 2253   ALBUMIN 4.9 (H) 05/21/2023 0932   AST 39 11/25/2023 2253   ALT 28 11/25/2023 2253   ALKPHOS 54 11/25/2023 2253   BILITOT 0.3 11/25/2023 2253   BILITOT 0.3 05/21/2023 0932   GFR 57.45 (L) 10/02/2021 1643   EGFR 62 05/21/2023 0932   GFRNONAA >60 11/27/2023 0457    GFR Estimated Creatinine Clearance: 49 mL/min (by C-G  formula based on SCr of 0.78 mg/dL). No results for input(s): LIPASE, AMYLASE in the last 72 hours. No results for input(s): CKTOTAL, CKMB, CKMBINDEX, TROPONINI in the last 72 hours. Invalid input(s): POCBNP No results for input(s): DDIMER in the last 72 hours. No results for input(s): HGBA1C in the last 72 hours. No results for input(s): CHOL, HDL, LDLCALC, TRIG, CHOLHDL, LDLDIRECT in the last 72 hours. Recent Labs    11/26/23 0549  TSH 0.523   No results for input(s): VITAMINB12, FOLATE, FERRITIN, TIBC, IRON, RETICCTPCT in the last 72 hours. Coags: No results for input(s): INR in the last 72 hours.  Invalid input(s): PT Microbiology: No results found for this or any previous visit (from the past 240 hours).  FURTHER DISCHARGE INSTRUCTIONS:  Get Medicines reviewed and adjusted: Please take all your medications with you for your next visit with your Primary MD  Laboratory/radiological data: Please request your Primary MD to go over all hospital tests and procedure/radiological results at the follow up, please ask your Primary MD to get all Hospital records sent to his/her office.  In some cases, they will be blood work, cultures and biopsy results pending at the time of your discharge. Please request that your primary care M.D. goes through all the records of your hospital data and follows up on these results.  Also Note the following: If you experience worsening of your admission symptoms, develop shortness of breath, life threatening emergency, suicidal or homicidal thoughts you must seek medical attention immediately by calling 911 or calling your MD immediately  if symptoms less severe.  You must read complete instructions/literature along with all the possible adverse reactions/side effects for all the Medicines you take and that have been prescribed to you. Take any new Medicines after you have completely understood and accpet all  the possible adverse reactions/side effects.   Do not drive when taking Pain medications or sleeping medications (Benzodaizepines)  Do not take more than prescribed Pain, Sleep and Anxiety Medications. It is not advisable to combine anxiety,sleep and pain medications without talking with your primary care practitioner  Special Instructions: If you have smoked or chewed Tobacco  in the last 2 yrs please stop smoking, stop any regular Alcohol   and or any Recreational drug use.  Wear Seat belts while driving.  Please note: You were cared for by a hospitalist during your hospital stay. Once you are discharged, your primary care physician will handle any further medical issues. Please note that NO REFILLS for any discharge medications will be authorized once you are discharged, as it is imperative that you return to your primary care physician (or establish a relationship with a primary care physician if you do not have one) for your post hospital discharge needs so that they can reassess your need for medications and monitor your lab values.  Total Time spent coordinating discharge including counseling, education and face to face time equals greater than 30 minutes.  SignedBETHA Donalda Applebaum 11/27/2023 8:57 AM

## 2023-11-27 NOTE — Progress Notes (Addendum)
 Physical Therapy Treatment Patient Details Name: Kara Tapia MRN: [id number] DOB: November 18, 1940 Today's Date: 11/27/2023   History of Present Illness Pt is an 83 yo female presenting to [facility] on 11/26/23 with recurrent syncopal episodes. CT negative for acute abnormalities. Pt found to have nondisplaced C4 transverse process fx. PMH of vertigo, HTN, PVD    PT Comments  Pt tolerated treatment well today. Pt today was able to ambulate in hallway with rollator at supervision level and navigate stairs. Pt educated on proper use of brakes with rollator. DC recs updated to OPPT upon DC. Pt anticipates DC home today.     If plan is discharge home, recommend the following: Assist for transportation;Help with stairs or ramp for entrance   Can travel by private vehicle        Equipment Recommendations  None recommended by PT    Recommendations for Other Services       Precautions / Restrictions Precautions Precautions: Fall;Other (comment) Recall of Precautions/Restrictions: Intact Precaution/Restrictions Comments: Watch orthostatics Required Braces or Orthoses: Cervical Brace Cervical Brace: Hard collar;Other (comment);For comfort Restrictions Weight Bearing Restrictions Per Provider Order: No     Mobility  Bed Mobility Overal bed mobility: Modified Independent                  Transfers Overall transfer level: Independent Equipment used: None, Rollator (4 wheels) Transfers: Sit to/from Stand Sit to Stand: Independent           General transfer comment: Cues for locking rollator brakes    Ambulation/Gait Ambulation/Gait assistance: Supervision Gait Distance (Feet): 200 Feet Assistive device: Rollator (4 wheels) Gait Pattern/deviations: Step-through pattern, Decreased stride length Gait velocity: WFL Gait velocity interpretation: >2.62 ft/sec, indicative of community ambulatory   General Gait Details: no LOB noted. good speed.   Stairs Stairs: Yes Stairs  assistance: Supervision Stair Management: One rail Right, Step to pattern, Sideways Number of Stairs: 2 General stair comments: no LOB noted.   Wheelchair Mobility     Tilt Bed    Modified Rankin (Stroke Patients Only)       Balance Overall balance assessment: Needs assistance Sitting-balance support: Feet unsupported, No upper extremity supported Sitting balance-Leahy Scale: Good     Standing balance support: During functional activity, No upper extremity supported Standing balance-Leahy Scale: Good                              Communication Communication Communication: No apparent difficulties  Cognition Arousal: Alert Behavior During Therapy: WFL for tasks assessed/performed   PT - Cognitive impairments: No apparent impairments                         Following commands: Intact      Cueing Cueing Techniques: Verbal cues  Exercises      General Comments General comments (skin integrity, edema, etc.): VSS      Pertinent Vitals/Pain Pain Assessment Pain Assessment: No/denies pain    Home Living                          Prior Function            PT Goals (current goals can now be found in the care plan section) Progress towards PT goals: Progressing toward goals    Frequency    Min 2X/week      PT Plan  Co-evaluation              AM-PAC PT 6 Clicks Mobility   Outcome Measure  Help needed turning from your back to your side while in a flat bed without using bedrails?: None Help needed moving from lying on your back to sitting on the side of a flat bed without using bedrails?: None Help needed moving to and from a bed to a chair (including a wheelchair)?: None Help needed standing up from a chair using your arms (e.g., wheelchair or bedside chair)?: None Help needed to walk in hospital room?: A Little Help needed climbing 3-5 steps with a railing? : A Little 6 Click Score: 22    End of Session  Equipment Utilized During Treatment: Gait belt Activity Tolerance: Patient tolerated treatment well Patient left: in bed;with call bell/phone within reach Nurse Communication: Mobility status PT Visit Diagnosis: Unsteadiness on feet (R26.81);Other abnormalities of gait and mobility (R26.89);Muscle weakness (generalized) (M62.81);Repeated falls (R29.6);History of falling (Z91.81)     Time: 9088-9076 PT Time Calculation (min) (ACUTE ONLY): 12 min  Charges:    $Gait Training: 8-22 mins PT General Charges $$ ACUTE PT VISIT: 1 Visit                     Kara Tapia, PT, DPT Acute Rehab Services [phone]    Brae Gartman 11/27/2023, 9:33 AM

## 2023-11-28 DIAGNOSIS — M5030 Other cervical disc degeneration, unspecified cervical region: Secondary | ICD-10-CM | POA: Diagnosis not present

## 2023-11-28 DIAGNOSIS — F4321 Adjustment disorder with depressed mood: Secondary | ICD-10-CM | POA: Diagnosis not present

## 2023-11-28 DIAGNOSIS — F17210 Nicotine dependence, cigarettes, uncomplicated: Secondary | ICD-10-CM | POA: Diagnosis not present

## 2023-11-28 DIAGNOSIS — M48061 Spinal stenosis, lumbar region without neurogenic claudication: Secondary | ICD-10-CM | POA: Diagnosis not present

## 2023-11-28 DIAGNOSIS — M4316 Spondylolisthesis, lumbar region: Secondary | ICD-10-CM | POA: Diagnosis not present

## 2023-11-28 DIAGNOSIS — M8008XD Age-related osteoporosis with current pathological fracture, vertebra(e), subsequent encounter for fracture with routine healing: Secondary | ICD-10-CM | POA: Diagnosis not present

## 2023-11-28 DIAGNOSIS — I10 Essential (primary) hypertension: Secondary | ICD-10-CM | POA: Diagnosis not present

## 2023-11-28 DIAGNOSIS — I739 Peripheral vascular disease, unspecified: Secondary | ICD-10-CM | POA: Diagnosis not present

## 2023-11-28 DIAGNOSIS — M47812 Spondylosis without myelopathy or radiculopathy, cervical region: Secondary | ICD-10-CM | POA: Diagnosis not present

## 2023-11-28 DIAGNOSIS — I951 Orthostatic hypotension: Secondary | ICD-10-CM | POA: Diagnosis not present

## 2023-11-28 DIAGNOSIS — G4750 Parasomnia, unspecified: Secondary | ICD-10-CM | POA: Diagnosis not present

## 2023-11-28 DIAGNOSIS — J449 Chronic obstructive pulmonary disease, unspecified: Secondary | ICD-10-CM | POA: Diagnosis not present

## 2023-11-28 DIAGNOSIS — H53419 Scotoma involving central area, unspecified eye: Secondary | ICD-10-CM | POA: Diagnosis not present

## 2023-11-28 DIAGNOSIS — F515 Nightmare disorder: Secondary | ICD-10-CM | POA: Diagnosis not present

## 2023-11-28 DIAGNOSIS — R1310 Dysphagia, unspecified: Secondary | ICD-10-CM | POA: Diagnosis not present

## 2023-11-28 DIAGNOSIS — G8929 Other chronic pain: Secondary | ICD-10-CM | POA: Diagnosis not present

## 2023-11-28 DIAGNOSIS — I2510 Atherosclerotic heart disease of native coronary artery without angina pectoris: Secondary | ICD-10-CM | POA: Diagnosis not present

## 2023-11-28 DIAGNOSIS — G43111 Migraine with aura, intractable, with status migrainosus: Secondary | ICD-10-CM | POA: Diagnosis not present

## 2023-11-28 DIAGNOSIS — K5730 Diverticulosis of large intestine without perforation or abscess without bleeding: Secondary | ICD-10-CM | POA: Diagnosis not present

## 2023-11-28 DIAGNOSIS — G3184 Mild cognitive impairment, so stated: Secondary | ICD-10-CM | POA: Diagnosis not present

## 2023-11-28 DIAGNOSIS — K2100 Gastro-esophageal reflux disease with esophagitis, without bleeding: Secondary | ICD-10-CM | POA: Diagnosis not present

## 2023-11-28 DIAGNOSIS — Z48812 Encounter for surgical aftercare following surgery on the circulatory system: Secondary | ICD-10-CM | POA: Diagnosis not present

## 2023-11-28 DIAGNOSIS — M5116 Intervertebral disc disorders with radiculopathy, lumbar region: Secondary | ICD-10-CM | POA: Diagnosis not present

## 2023-11-28 DIAGNOSIS — E782 Mixed hyperlipidemia: Secondary | ICD-10-CM | POA: Diagnosis not present

## 2023-11-28 DIAGNOSIS — M4726 Other spondylosis with radiculopathy, lumbar region: Secondary | ICD-10-CM | POA: Diagnosis not present

## 2023-12-01 DIAGNOSIS — M4316 Spondylolisthesis, lumbar region: Secondary | ICD-10-CM | POA: Diagnosis not present

## 2023-12-01 DIAGNOSIS — Z1330 Encounter for screening examination for mental health and behavioral disorders, unspecified: Secondary | ICD-10-CM | POA: Diagnosis not present

## 2023-12-01 DIAGNOSIS — M47816 Spondylosis without myelopathy or radiculopathy, lumbar region: Secondary | ICD-10-CM | POA: Diagnosis not present

## 2023-12-02 ENCOUNTER — Inpatient Hospital Stay: Attending: Hematology and Oncology | Admitting: Hematology and Oncology

## 2023-12-02 ENCOUNTER — Encounter: Payer: Self-pay | Admitting: Hematology and Oncology

## 2023-12-02 ENCOUNTER — Inpatient Hospital Stay

## 2023-12-02 VITALS — BP 122/71 | HR 71 | Temp 97.6°F | Resp 18 | Ht 66.0 in | Wt 130.2 lb

## 2023-12-02 DIAGNOSIS — M810 Age-related osteoporosis without current pathological fracture: Secondary | ICD-10-CM | POA: Diagnosis not present

## 2023-12-02 DIAGNOSIS — F17210 Nicotine dependence, cigarettes, uncomplicated: Secondary | ICD-10-CM | POA: Diagnosis not present

## 2023-12-02 DIAGNOSIS — D472 Monoclonal gammopathy: Secondary | ICD-10-CM | POA: Insufficient documentation

## 2023-12-02 DIAGNOSIS — R55 Syncope and collapse: Secondary | ICD-10-CM | POA: Insufficient documentation

## 2023-12-02 NOTE — Assessment & Plan Note (Signed)
 The patient has incidental finding of MGUS I do not believe that is associated with her recurrent syncopal episode, dizziness and vertigo She has no associated anemia, renal failure or hypercalcemia I have reviewed multiple imaging studies performed over the last 2 months which show no evidence of lytic lesions or lymphadenopathy I plan to repeat labs again in 2 months, approximately 6 months away from her labs from May to get another point of comparison I discussed natural history of MGUS with the patient and her daughter and they are both in agreement

## 2023-12-02 NOTE — Progress Notes (Signed)
 [facility] CONSULT NOTE  Patient Care Team: Waddell Danelle ORN, MD as PCP - General (Cardiology) Raford Riggs, MD as PCP - Cardiology (Cardiology)  ASSESSMENT & PLAN:  MGUS (monoclonal gammopathy of unknown significance) The patient has incidental finding of MGUS I do not believe that is associated with Kara recurrent syncopal episode, dizziness and vertigo She has no associated anemia, renal failure or hypercalcemia I have reviewed multiple imaging studies performed over the last 2 months which show no evidence of lytic lesions or lymphadenopathy I plan to repeat labs again in 2 months, approximately 6 months away from Kara labs from May to get another point of comparison I discussed natural history of MGUS with the patient and Kara Tapia and they are both in agreement  Senile osteoporosis She had multiple falls Thankfully, she did not have any fractures Kara last bone density scan was done in March 2025 which show evidence of osteoporosis I recommend the patient to take adequate vitamin D and calcium  I plan to check vitamin D level in 2 months  Syncope She had extensive workup I suspect that is element of dehydration due to postural dizziness She has not been eating consistently and drinking enough liquids I recommend the patient/Tapia to start tracking Kara oral intake consistently  Orders Placed This Encounter  Procedures   CMP (Cancer Center only)    Standing Status:   Future    Expiration Date:   12/01/2024   CBC with Differential (Cancer Center Only)    Standing Status:   Future    Expiration Date:   12/01/2024   Sedimentation rate    Standing Status:   Future    Expiration Date:   12/01/2024   Kappa/lambda light chains    Standing Status:   Future    Expiration Date:   12/01/2024   Multiple Myeloma Panel (SPEP&IFE w/QIG)    Standing Status:   Future    Expiration Date:   12/01/2024   Lactate dehydrogenase    Standing Status:   Future    Expiration Date:    12/01/2024   Beta 2 microglobulin, serum    Standing Status:   Future    Expiration Date:   12/01/2024   VITAMIN D 25 Hydroxy (Vit-D Deficiency, Fractures)    Standing Status:   Future    Expiration Date:   12/01/2024    All questions were answered. The patient knows to call the clinic with any problems, questions or concerns. I spent 60 minutes counseling the patient face to face and reviewing multiple test results.     Almarie Bedford, MD 12/02/23 1:57 PM  CHIEF COMPLAINTS/PURPOSE OF CONSULTATION:  MGUS  HISTORY OF PRESENTING ILLNESS:  Kara Tapia 83 y.o. female is here because of findings of MGUS in Kara blood work I have the opportunity to review Kara labs dated back to February of this year She had mild cognitive impairment in the baseline She had recurrent symptoms of dizziness, recurrent falls and syncopal episodes and had extensive evaluation by cardiologist and neurologist On May 21, 2023, Kara neurologist ordered a serum protein electrophoresis Immunoglobulin levels were normal but serum protein electrophoresis detected IgM monoclonal protein with lambda light chain specificity only detectable on immunofixation Kara IgM level was normal On Jul 30, 2023, the same test was repeated with the same results  She denies history of abnormal bone pain or bone fracture. She had multiple falls with numerous imaging studies but thankfully have not sustained any major fractures She  is known to have osteoporosis Patient denies history of recurrent infection or atypical infections such as shingles of meningitis. Denies chills, night sweats, anorexia or abnormal weight loss. According to Kara Tapia, she had significant pain/radiculopathy with sciatica symptoms recently after a fall She is currently being managed by neurosurgeon/pain management and has received a series of injections Latest serum chemistries from September 2025 show no evidence of renal failure, hypercalcemia or elevated  serum protein.  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/2014   PVD (peripheral vascular disease) (HCC)    Reflux esophagitis    REM sleep behavior disorder 08/09/2014   Scotoma 08/09/2014   Sleep behavior disorder, REM 06/04/2017    SURGICAL HISTORY: Past Surgical History:  Procedure Laterality Date   CARPAL TUNNEL RELEASE Right    CATARACT EXTRACTION, BILATERAL Bilateral    LOOP RECORDER INSERTION N/A 11/26/2023   Procedure: LOOP RECORDER INSERTION;  Surgeon: Waddell Danelle ORN, MD;  Location: [facility];  Service: Cardiovascular;  Laterality: N/A;    SOCIAL HISTORY: Social History   Socioeconomic History   Marital status: Divorced    Spouse name: Not on file   Number of children: 3   Years of education: 16   Highest education level:  Not on file  Occupational History   Occupation: retired  Tobacco Use   Smoking status: Every Day    Current packs/day: 0.50    Types: Cigarettes    Passive exposure: Never   Smokeless tobacco: Never  Vaping Use   Vaping status: Never Used  Substance and Sexual Activity   Alcohol  use: Yes    Alcohol /week: 5.0 standard drinks of alcohol     Types: 5 Glasses of wine per week    Comment: 1 glass of wine nightly   Drug use: Yes    Types: Other-see comments    Comment: CBD oil at night on occasion   Sexual activity: Not on file  Other Topics Concern   Not on file  Social History Narrative   Drinks 3-4 cups of caffeine daily.   Social Drivers of Health   Financial Resource Strain: Low Risk  (11/06/2023)   Received from [facility]: Lambert Rhonda W   Overall Financial Resource Strain (CARDIA)    How hard is it for you to pay for the  very basics like food, housing, medical care, and heating?: Not hard at all  Food Insecurity: No Food Insecurity (11/26/2023)   Hunger Vital Sign    Worried About Running Out of Food in the Last Year: Never true    Ran Out of Food in the Last Year: Never true  Transportation Needs: No Transportation Needs (11/26/2023)   PRAPARE - Administrator, Civil Service (Medical): No    Lack of Transportation (Non-Medical): No  Physical Activity: Not on file  Stress: Not on file  Social Connections: Unknown (11/26/2023)   Social Connection and Isolation Panel    Frequency of Communication with Friends and Family: Patient declined    Frequency of Social Gatherings with Friends and Family: Patient declined    Attends Religious Services: Patient declined    Database administrator or Organizations: Patient declined    Attends Banker Meetings: Patient declined    Marital Status: Divorced  Catering manager Violence: Not At Risk (11/26/2023)   Humiliation, Afraid, Rape, and Kick questionnaire    Fear of Current or Ex-Partner: No    Emotionally Abused: No     Physically Abused: No    Sexually Abused: No    FAMILY HISTORY: Family History  Problem Relation Age of Onset   Diabetes Mother    Heart disease Mother    Cancer Mother        type unknown   Bone cancer Father    Diabetes Sister    Lung disease Sister    COPD Sister    Heart attack Paternal Grandfather    Down syndrome Tapia    Dementia Tapia    Other Tapia        colon surgery    ALLERGIES:  is allergic to lidocaine , gabapentin, and novocain [procaine].  MEDICATIONS:  Current Outpatient Medications  Medication Sig Dispense Refill   acetaminophen  (TYLENOL ) 500 MG tablet Take 500-1,000 mg by mouth every 6 (six) hours as needed for mild pain (pain score 1-3).     albuterol  (VENTOLIN  HFA) 108 (90 Base) MCG/ACT inhaler Inhale 2 puffs into the lungs every 4 (four) hours as needed for wheezing or shortness of breath. (Patient not taking: Reported on 11/26/2023)     amLODipine  (NORVASC ) 2.5 MG tablet Take 2.5 mg by mouth daily as needed (Systolic blood pressure >150).     aspirin  EC 81 MG tablet Take 81 mg by mouth daily.     CALCIUM  PO Take 1 tablet by mouth daily after breakfast.     Cholecalciferol (VITAMIN D3 PO) Take 1 capsule by mouth daily after breakfast.     clonazePAM  (KLONOPIN ) 0.5 MG tablet Take 0.5 tablets (0.25 mg total) by mouth at bedtime. (Patient taking differently: Take 0.25 mg by mouth at bedtime as needed (sleep).) 15 tablet 5   Cyanocobalamin  (VITAMIN B12 PO) Take 1 tablet by mouth daily after breakfast.     donepezil  (ARICEPT ) 10 MG tablet Take 1 tablet (10 mg total) by mouth daily. 90 tablet 3   ibuprofen  (ADVIL ) 200 MG tablet Take 600-800 mg by mouth 2 (two) times daily as needed for mild pain (pain score 1-3).     ipratropium (ATROVENT ) 0.06 % nasal spray Place 2 sprays into the nose daily.     lidocaine  (LIDODERM ) 5 % Place 1 patch onto the skin daily. Remove & Discard patch within 12 hours or as directed by MD 14 patch 0   MAGNESIUM PO Take 1 tablet  by mouth daily  after breakfast.     meclizine  (ANTIVERT ) 12.5 MG tablet Take 12.5 mg by mouth 3 (three) times daily as needed for dizziness.     pregabalin (LYRICA) 75 MG capsule Take 75 mg by mouth 2 (two) times daily.     rosuvastatin  (CRESTOR ) 10 MG tablet Take 10 mg by mouth daily.     No current facility-administered medications for this visit.    REVIEW OF SYSTEMS:   All other systems were reviewed with the patient and are negative.  PHYSICAL EXAMINATION: ECOG PERFORMANCE STATUS: 1 - Symptomatic but completely ambulatory  Vitals:   12/02/23 1302  BP: 122/71  Pulse: 71  Resp: 18  Temp: 97.6 F (36.4 C)  SpO2: 100%   Filed Weights   12/02/23 1302  Weight: 130 lb 3.2 oz (59.1 kg)    GENERAL:alert, no distress and comfortable SKIN: skin color, texture, turgor are normal, no rashes or significant lesions EYES: normal, conjunctiva are pink and non-injected, sclera clear OROPHARYNX:no exudate, no erythema and lips, buccal mucosa, and tongue normal  NECK: supple, thyroid  normal size, non-tender, without nodularity LYMPH:  no palpable lymphadenopathy in the cervical, axillary or inguinal LUNGS: clear to auscultation and percussion with normal breathing effort HEART: regular rate & rhythm and no murmurs and no lower extremity edema ABDOMEN:abdomen soft, non-tender and normal bowel sounds Musculoskeletal:no cyanosis of digits and no clubbing  PSYCH: alert & oriented x 3 with fluent speech NEURO: no focal motor/sensory deficits  LABORATORY DATA:  I have reviewed the data as listed Lab Results  Component Value Date   WBC 5.7 11/27/2023   HGB 11.9 (L) 11/27/2023   HCT 35.2 (L) 11/27/2023   MCV 99.7 11/27/2023   PLT 215 11/27/2023    RADIOGRAPHIC STUDIES: I have personally reviewed the radiological images as listed and agreed with the findings in the report. EP PPM/ICD IMPLANT Result Date: 11/26/2023 CONCLUSIONS:  1. Successful implantation of a implantable loop recorder  for a history of Syncope.  2. No early apparent complications. Danelle Birmingham, MD Cardiac Electrophysiology    MR BRAIN WO CONTRAST Result Date: 11/26/2023 EXAM: MRI BRAIN WITHOUT CONTRAST 11/26/2023 08:51:54 AM TECHNIQUE: Multiplanar multisequence MRI of the head/brain was performed without the administration of intravenous contrast. COMPARISON: MRI brain 06/13/23. CLINICAL HISTORY: Neuro deficit, acute, stroke suspected; recurrent syncope, blurry vision. FINDINGS: BRAIN AND VENTRICLES: There are nonspecific hyperintense foci in the subcortical and periventricular white matter that most likely represent chronic microangiopathic ischemic changes in a patient of this age. Mild parenchymal volume loss. 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 ORBITS: Bilateral lens replacement. SINUSES AND MASTOIDS: Mild mucosal thickening in the ethmoid sinuses. BONES AND SOFT TISSUES: Normal marrow signal. No acute soft tissue abnormality. IMPRESSION: 1. No acute intracranial abnormality. 2. 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 3. Moderate chronic microangiopathic ischemic changes. 4. Mild parenchymal volume loss. Electronically signed by: Donnice Mania MD 11/26/2023 09:48 AM EDT RP Workstation: [id number]   CT Cervical Spine Wo Contrast Result Date: 11/26/2023 EXAM: CT CERVICAL SPINE WITHOUT CONTRAST 11/26/2023 02:39:26 AM TECHNIQUE: CT of the cervical spine was performed without the administration of intravenous contrast. Multiplanar reformatted images are provided for review. Automated exposure control, iterative reconstruction, and/or weight based adjustment of the mA/kV was utilized to reduce the radiation dose to as low as reasonably achievable. COMPARISON: None available. CLINICAL HISTORY: Neck trauma (Age >= 65y). Pt has been having syncopal episodes for last couple of weeks. Has had about 7 in total. Pt remembers  walking then waking up on the ground. Happened twice yesterday. May have hit head. No  thinners. Denies feeling dizzy before syncopal episode. No CP ; SOB. FINDINGS: CERVICAL SPINE: BONES AND ALIGNMENT: Age-Indeterminate minimally displaced fracture of the left transverse process of C4. DEGENERATIVE CHANGES: Multilevel mid cervical degenerative disc disease without spinal canal stenosis. SOFT TISSUES: No prevertebral soft tissue swelling. IMPRESSION: 1. Age-indeterminate minimally displaced fracture of the left transverse process of C4. 2. Multilevel mid cervical degenerative disc disease without spinal canal stenosis. Electronically signed by: Franky Stanford MD 11/26/2023 03:07 AM EDT RP Workstation: [id number]   CT Lumbar Spine Wo Contrast Result Date: 11/26/2023 EXAM: CT OF THE LUMBAR SPINE WITHOUT CONTRAST 11/26/2023 02:39:26 AM TECHNIQUE: CT of the lumbar spine was performed without the administration of intravenous contrast. Multiplanar reformatted images are provided for review. Automated exposure control, iterative reconstruction, and/or weight based adjustment of the mA/kV was utilized to reduce the radiation dose to as low as reasonably achievable. COMPARISON: None available. CLINICAL HISTORY: Lumbar radiculopathy, trauma. Pt has been having syncopal episodes for last couple of weeks. Has had about 7 in total. Pt remembers walking then waking up on the ground. Happened twice yesterday. May have hit head. No thinners. Denies feeling dizzy before syncopal episode. No CP ; SOB. Does have tingling down the back of both thighs but that is related to spinal issues she is being seen fo FINDINGS: BONES AND ALIGNMENT: Grade 1 anterolisthesis at L4-5. No acute fracture or suspicious bone lesion. DEGENERATIVE CHANGES: Multilevel lower lumbar facet arthrosis. SOFT TISSUES: Calcific aortic atherosclerosis. No acute abnormality. IMPRESSION: 1. No acute findings. 2. Grade 1 anterolisthesis at L4-5. 3. Multilevel lower lumbar facet arthrosis. Electronically signed by: Franky Stanford MD 11/26/2023 03:04 AM EDT  RP Workstation: [id number]   CT ANGIO HEAD NECK W WO CM Result Date: 11/26/2023 EXAM: CTA Head and Neck with Intravenous Contrast. CT Head without and with Contrast. CLINICAL HISTORY: Neuro deficit, acute, stroke suspected. Pt has been having syncopal episodes for last couple of weeks. Has had about 7 in total. Pt remembers walking then waking up on the ground. Happened twice yesterday. May have hit head. No thinners. Denies feeling dizzy before syncopal episode. No CP ; SOB. Does have tingling down the back of both thighs but that is related to spinal issues she is being seen fo. TECHNIQUE: Axial CTA images of the head and neck performed with intravenous contrast. MIP reconstructed images were created and reviewed. Axial computed tomography images of the head/brain performed without and with intravenous contrast. Note: Per PQRS, the description of internal carotid artery percent stenosis, including 0 percent or normal exam, is based on [place] American Symptomatic Carotid Endarterectomy Trial (NASCET) criteria. Dose reduction technique was used including one or more of the following: automated exposure control, adjustment of mA and kV according to patient size, and/or iterative reconstruction. CONTRAST: 75mL (iohexol  (OMNIPAQUE ) 350 MG/ML injection 75 mL IOHEXOL  350 MG/ML SOLN) COMPARISON: None provided. FINDINGS: BRAIN: No acute intraparenchymal hemorrhage. No mass lesion. No CT evidence for acute territorial infarct. No midline shift or extra-axial collection. VENTRICLES: No hydrocephalus. ORBITS: The orbits are unremarkable. SINUSES AND MASTOIDS: The paranasal sinuses and mastoid air cells are clear. COMMON CAROTID ARTERIES: No significant stenosis. No dissection or occlusion. INTERNAL CAROTID ARTERIES: No stenosis by NASCET criteria. No dissection or occlusion. VERTEBRAL ARTERIES: No significant stenosis. No dissection or occlusion. ANTERIOR CEREBRAL ARTERIES: No significant stenosis. No occlusion. No aneurysm.  MIDDLE CEREBRAL ARTERIES: No significant stenosis. No occlusion. No aneurysm.  POSTERIOR CEREBRAL ARTERIES: No significant stenosis. No occlusion. No aneurysm. BASILAR ARTERY: No significant stenosis. No occlusion. No aneurysm. SOFT TISSUES: No acute finding. No masses or lymphadenopathy. BONES: No acute osseous abnormality. IMPRESSION: 1. No acute intracranial hemorrhage or ischemic change. 2. No evidence of significant stenosis, aneurysmal dilatation, or dissection involving the arteries of the head and neck. Electronically signed by: Franky Stanford MD 11/26/2023 02:47 AM EDT RP Workstation: [id number]   DG Chest 2 View Result Date: 11/26/2023 CLINICAL DATA:  Syncope EXAM: CHEST - 2 VIEW COMPARISON:  07/02/2023 FINDINGS: Lungs are well expanded, symmetric, and clear. No pneumothorax or pleural effusion. Cardiac size within normal limits. Pulmonary vascularity is normal. Osseous structures are age-appropriate. No acute bone abnormality. IMPRESSION: 1. No active cardiopulmonary disease. Electronically Signed   By: Dorethia Molt M.D.   On: 11/26/2023 01:21

## 2023-12-02 NOTE — Assessment & Plan Note (Signed)
 She had extensive workup I suspect that is element of dehydration due to postural dizziness She has not been eating consistently and drinking enough liquids I recommend the patient/daughter to start tracking her oral intake consistently

## 2023-12-02 NOTE — Assessment & Plan Note (Signed)
 She had multiple falls Thankfully, she did not have any fractures Her last bone density scan was done in March 2025 which show evidence of osteoporosis I recommend the patient to take adequate vitamin D and calcium  I plan to check vitamin D level in 2 months

## 2023-12-03 DIAGNOSIS — M48062 Spinal stenosis, lumbar region with neurogenic claudication: Secondary | ICD-10-CM | POA: Diagnosis not present

## 2023-12-03 DIAGNOSIS — K2100 Gastro-esophageal reflux disease with esophagitis, without bleeding: Secondary | ICD-10-CM | POA: Diagnosis not present

## 2023-12-03 DIAGNOSIS — M5116 Intervertebral disc disorders with radiculopathy, lumbar region: Secondary | ICD-10-CM | POA: Diagnosis not present

## 2023-12-03 DIAGNOSIS — J449 Chronic obstructive pulmonary disease, unspecified: Secondary | ICD-10-CM | POA: Diagnosis not present

## 2023-12-03 DIAGNOSIS — I951 Orthostatic hypotension: Secondary | ICD-10-CM | POA: Diagnosis not present

## 2023-12-03 DIAGNOSIS — H53419 Scotoma involving central area, unspecified eye: Secondary | ICD-10-CM | POA: Diagnosis not present

## 2023-12-03 DIAGNOSIS — G4750 Parasomnia, unspecified: Secondary | ICD-10-CM | POA: Diagnosis not present

## 2023-12-03 DIAGNOSIS — M8008XD Age-related osteoporosis with current pathological fracture, vertebra(e), subsequent encounter for fracture with routine healing: Secondary | ICD-10-CM | POA: Diagnosis not present

## 2023-12-03 DIAGNOSIS — F515 Nightmare disorder: Secondary | ICD-10-CM | POA: Diagnosis not present

## 2023-12-03 DIAGNOSIS — M4316 Spondylolisthesis, lumbar region: Secondary | ICD-10-CM | POA: Diagnosis not present

## 2023-12-03 DIAGNOSIS — K5730 Diverticulosis of large intestine without perforation or abscess without bleeding: Secondary | ICD-10-CM | POA: Diagnosis not present

## 2023-12-03 DIAGNOSIS — M4310 Spondylolisthesis, site unspecified: Secondary | ICD-10-CM | POA: Diagnosis not present

## 2023-12-03 DIAGNOSIS — F17210 Nicotine dependence, cigarettes, uncomplicated: Secondary | ICD-10-CM | POA: Diagnosis not present

## 2023-12-03 DIAGNOSIS — I2510 Atherosclerotic heart disease of native coronary artery without angina pectoris: Secondary | ICD-10-CM | POA: Diagnosis not present

## 2023-12-03 DIAGNOSIS — M48061 Spinal stenosis, lumbar region without neurogenic claudication: Secondary | ICD-10-CM | POA: Diagnosis not present

## 2023-12-03 DIAGNOSIS — I10 Essential (primary) hypertension: Secondary | ICD-10-CM | POA: Diagnosis not present

## 2023-12-03 DIAGNOSIS — Z48812 Encounter for surgical aftercare following surgery on the circulatory system: Secondary | ICD-10-CM | POA: Diagnosis not present

## 2023-12-03 DIAGNOSIS — M47812 Spondylosis without myelopathy or radiculopathy, cervical region: Secondary | ICD-10-CM | POA: Diagnosis not present

## 2023-12-03 DIAGNOSIS — M4726 Other spondylosis with radiculopathy, lumbar region: Secondary | ICD-10-CM | POA: Diagnosis not present

## 2023-12-03 DIAGNOSIS — M5030 Other cervical disc degeneration, unspecified cervical region: Secondary | ICD-10-CM | POA: Diagnosis not present

## 2023-12-03 DIAGNOSIS — G43111 Migraine with aura, intractable, with status migrainosus: Secondary | ICD-10-CM | POA: Diagnosis not present

## 2023-12-03 DIAGNOSIS — G8929 Other chronic pain: Secondary | ICD-10-CM | POA: Diagnosis not present

## 2023-12-03 DIAGNOSIS — R1310 Dysphagia, unspecified: Secondary | ICD-10-CM | POA: Diagnosis not present

## 2023-12-03 DIAGNOSIS — E782 Mixed hyperlipidemia: Secondary | ICD-10-CM | POA: Diagnosis not present

## 2023-12-03 DIAGNOSIS — I739 Peripheral vascular disease, unspecified: Secondary | ICD-10-CM | POA: Diagnosis not present

## 2023-12-03 DIAGNOSIS — G3184 Mild cognitive impairment, so stated: Secondary | ICD-10-CM | POA: Diagnosis not present

## 2023-12-03 DIAGNOSIS — F4321 Adjustment disorder with depressed mood: Secondary | ICD-10-CM | POA: Diagnosis not present

## 2023-12-04 ENCOUNTER — Telehealth: Payer: Self-pay | Admitting: *Deleted

## 2023-12-04 DIAGNOSIS — R1310 Dysphagia, unspecified: Secondary | ICD-10-CM | POA: Diagnosis not present

## 2023-12-04 DIAGNOSIS — M48061 Spinal stenosis, lumbar region without neurogenic claudication: Secondary | ICD-10-CM | POA: Diagnosis not present

## 2023-12-04 DIAGNOSIS — G4750 Parasomnia, unspecified: Secondary | ICD-10-CM | POA: Diagnosis not present

## 2023-12-04 DIAGNOSIS — K5730 Diverticulosis of large intestine without perforation or abscess without bleeding: Secondary | ICD-10-CM | POA: Diagnosis not present

## 2023-12-04 DIAGNOSIS — M4726 Other spondylosis with radiculopathy, lumbar region: Secondary | ICD-10-CM | POA: Diagnosis not present

## 2023-12-04 DIAGNOSIS — F515 Nightmare disorder: Secondary | ICD-10-CM | POA: Diagnosis not present

## 2023-12-04 DIAGNOSIS — M47812 Spondylosis without myelopathy or radiculopathy, cervical region: Secondary | ICD-10-CM | POA: Diagnosis not present

## 2023-12-04 DIAGNOSIS — I739 Peripheral vascular disease, unspecified: Secondary | ICD-10-CM | POA: Diagnosis not present

## 2023-12-04 DIAGNOSIS — M5116 Intervertebral disc disorders with radiculopathy, lumbar region: Secondary | ICD-10-CM | POA: Diagnosis not present

## 2023-12-04 DIAGNOSIS — I2510 Atherosclerotic heart disease of native coronary artery without angina pectoris: Secondary | ICD-10-CM | POA: Diagnosis not present

## 2023-12-04 DIAGNOSIS — G8929 Other chronic pain: Secondary | ICD-10-CM | POA: Diagnosis not present

## 2023-12-04 DIAGNOSIS — G43111 Migraine with aura, intractable, with status migrainosus: Secondary | ICD-10-CM | POA: Diagnosis not present

## 2023-12-04 DIAGNOSIS — M4316 Spondylolisthesis, lumbar region: Secondary | ICD-10-CM | POA: Diagnosis not present

## 2023-12-04 DIAGNOSIS — M8008XD Age-related osteoporosis with current pathological fracture, vertebra(e), subsequent encounter for fracture with routine healing: Secondary | ICD-10-CM | POA: Diagnosis not present

## 2023-12-04 DIAGNOSIS — F17210 Nicotine dependence, cigarettes, uncomplicated: Secondary | ICD-10-CM | POA: Diagnosis not present

## 2023-12-04 DIAGNOSIS — I10 Essential (primary) hypertension: Secondary | ICD-10-CM | POA: Diagnosis not present

## 2023-12-04 DIAGNOSIS — K2100 Gastro-esophageal reflux disease with esophagitis, without bleeding: Secondary | ICD-10-CM | POA: Diagnosis not present

## 2023-12-04 DIAGNOSIS — F4321 Adjustment disorder with depressed mood: Secondary | ICD-10-CM | POA: Diagnosis not present

## 2023-12-04 DIAGNOSIS — I951 Orthostatic hypotension: Secondary | ICD-10-CM | POA: Diagnosis not present

## 2023-12-04 DIAGNOSIS — Z48812 Encounter for surgical aftercare following surgery on the circulatory system: Secondary | ICD-10-CM | POA: Diagnosis not present

## 2023-12-04 DIAGNOSIS — E782 Mixed hyperlipidemia: Secondary | ICD-10-CM | POA: Diagnosis not present

## 2023-12-04 DIAGNOSIS — M5030 Other cervical disc degeneration, unspecified cervical region: Secondary | ICD-10-CM | POA: Diagnosis not present

## 2023-12-04 DIAGNOSIS — J449 Chronic obstructive pulmonary disease, unspecified: Secondary | ICD-10-CM | POA: Diagnosis not present

## 2023-12-04 DIAGNOSIS — H53419 Scotoma involving central area, unspecified eye: Secondary | ICD-10-CM | POA: Diagnosis not present

## 2023-12-04 DIAGNOSIS — G3184 Mild cognitive impairment, so stated: Secondary | ICD-10-CM | POA: Diagnosis not present

## 2023-12-04 NOTE — Telephone Encounter (Signed)
   Pre-operative Risk Assessment    Patient Name: Kara Tapia  DOB: [birth year] MRN: [id number]   Date of last office visit: 10/21/23 DR. DANELLE BIRMINGHAM Date of next office visit: 12/31/23 DR. GREGG TAYOR    Request for Surgical Clearance    Procedure:  L4-5 LUMBAR FUSION  Date of Surgery:  Clearance TBD                                Surgeon:  DR. VICTORY GUNNELS  Surgeon's Group or Practice Name:  [facility] Phone number:  [phone] Fax number:  [phone] EXT [phone]   Type of Clearance Requested:   - Medical  - Pharmacy:  Hold Aspirin      Type of Anesthesia:  General    Additional requests/questions:    Bonney Niels Jest   12/04/2023, 5:40 PM

## 2023-12-05 DIAGNOSIS — G8929 Other chronic pain: Secondary | ICD-10-CM | POA: Diagnosis not present

## 2023-12-05 DIAGNOSIS — G43111 Migraine with aura, intractable, with status migrainosus: Secondary | ICD-10-CM | POA: Diagnosis not present

## 2023-12-05 DIAGNOSIS — I2510 Atherosclerotic heart disease of native coronary artery without angina pectoris: Secondary | ICD-10-CM | POA: Diagnosis not present

## 2023-12-05 DIAGNOSIS — K5730 Diverticulosis of large intestine without perforation or abscess without bleeding: Secondary | ICD-10-CM | POA: Diagnosis not present

## 2023-12-05 DIAGNOSIS — I10 Essential (primary) hypertension: Secondary | ICD-10-CM | POA: Diagnosis not present

## 2023-12-05 DIAGNOSIS — J449 Chronic obstructive pulmonary disease, unspecified: Secondary | ICD-10-CM | POA: Diagnosis not present

## 2023-12-05 DIAGNOSIS — F4321 Adjustment disorder with depressed mood: Secondary | ICD-10-CM | POA: Diagnosis not present

## 2023-12-05 DIAGNOSIS — K2100 Gastro-esophageal reflux disease with esophagitis, without bleeding: Secondary | ICD-10-CM | POA: Diagnosis not present

## 2023-12-05 DIAGNOSIS — Z48812 Encounter for surgical aftercare following surgery on the circulatory system: Secondary | ICD-10-CM | POA: Diagnosis not present

## 2023-12-05 DIAGNOSIS — M8008XD Age-related osteoporosis with current pathological fracture, vertebra(e), subsequent encounter for fracture with routine healing: Secondary | ICD-10-CM | POA: Diagnosis not present

## 2023-12-05 DIAGNOSIS — M47812 Spondylosis without myelopathy or radiculopathy, cervical region: Secondary | ICD-10-CM | POA: Diagnosis not present

## 2023-12-05 DIAGNOSIS — R1310 Dysphagia, unspecified: Secondary | ICD-10-CM | POA: Diagnosis not present

## 2023-12-05 DIAGNOSIS — E782 Mixed hyperlipidemia: Secondary | ICD-10-CM | POA: Diagnosis not present

## 2023-12-05 DIAGNOSIS — F17210 Nicotine dependence, cigarettes, uncomplicated: Secondary | ICD-10-CM | POA: Diagnosis not present

## 2023-12-05 DIAGNOSIS — H53419 Scotoma involving central area, unspecified eye: Secondary | ICD-10-CM | POA: Diagnosis not present

## 2023-12-05 DIAGNOSIS — M5116 Intervertebral disc disorders with radiculopathy, lumbar region: Secondary | ICD-10-CM | POA: Diagnosis not present

## 2023-12-05 DIAGNOSIS — F515 Nightmare disorder: Secondary | ICD-10-CM | POA: Diagnosis not present

## 2023-12-05 DIAGNOSIS — M5030 Other cervical disc degeneration, unspecified cervical region: Secondary | ICD-10-CM | POA: Diagnosis not present

## 2023-12-05 DIAGNOSIS — G3184 Mild cognitive impairment, so stated: Secondary | ICD-10-CM | POA: Diagnosis not present

## 2023-12-05 DIAGNOSIS — M4316 Spondylolisthesis, lumbar region: Secondary | ICD-10-CM | POA: Diagnosis not present

## 2023-12-05 DIAGNOSIS — I951 Orthostatic hypotension: Secondary | ICD-10-CM | POA: Diagnosis not present

## 2023-12-05 DIAGNOSIS — G4750 Parasomnia, unspecified: Secondary | ICD-10-CM | POA: Diagnosis not present

## 2023-12-05 DIAGNOSIS — I739 Peripheral vascular disease, unspecified: Secondary | ICD-10-CM | POA: Diagnosis not present

## 2023-12-05 DIAGNOSIS — M4726 Other spondylosis with radiculopathy, lumbar region: Secondary | ICD-10-CM | POA: Diagnosis not present

## 2023-12-05 DIAGNOSIS — M48061 Spinal stenosis, lumbar region without neurogenic claudication: Secondary | ICD-10-CM | POA: Diagnosis not present

## 2023-12-05 NOTE — Telephone Encounter (Signed)
   Name: Kara Tapia  DOB: 1940-04-12  MRN: [id number]  Primary Cardiologist: Annabella Scarce, MD  Chart reviewed as part of pre-operative protocol coverage. The patient has an upcoming visit scheduled with Dr. Waddell on 12/31/2023 at which time clearance can be addressed in case there are any issues that would impact surgical recommendations.  L4-5 LUMBAR FUSION Is not scheduled until TBD as below. I added preop FYI to appointment note so that provider is aware to address at time of outpatient visit.  Per office protocol the cardiology provider should forward their finalized clearance decision and recommendations regarding antiplatelet therapy to the requesting party below.    This message will also be routed to pharmacy pool and/or Dr Waddell for input on holding ASA as requested below so that this information is available to the clearing provider at time of patient's appointment.   I will route this message as FYI to requesting party and remove this message from the preop box as separate preop APP input not needed at this time.   Please call with any questions.  Lamarr Satterfield, NP  12/05/2023, 7:49 AM

## 2023-12-08 ENCOUNTER — Encounter

## 2023-12-08 DIAGNOSIS — E782 Mixed hyperlipidemia: Secondary | ICD-10-CM | POA: Diagnosis not present

## 2023-12-08 DIAGNOSIS — F515 Nightmare disorder: Secondary | ICD-10-CM | POA: Diagnosis not present

## 2023-12-08 DIAGNOSIS — M48061 Spinal stenosis, lumbar region without neurogenic claudication: Secondary | ICD-10-CM | POA: Diagnosis not present

## 2023-12-08 DIAGNOSIS — J449 Chronic obstructive pulmonary disease, unspecified: Secondary | ICD-10-CM | POA: Diagnosis not present

## 2023-12-08 DIAGNOSIS — G3184 Mild cognitive impairment, so stated: Secondary | ICD-10-CM | POA: Diagnosis not present

## 2023-12-08 DIAGNOSIS — G8929 Other chronic pain: Secondary | ICD-10-CM | POA: Diagnosis not present

## 2023-12-08 DIAGNOSIS — G43111 Migraine with aura, intractable, with status migrainosus: Secondary | ICD-10-CM | POA: Diagnosis not present

## 2023-12-08 DIAGNOSIS — I739 Peripheral vascular disease, unspecified: Secondary | ICD-10-CM | POA: Diagnosis not present

## 2023-12-08 DIAGNOSIS — F4321 Adjustment disorder with depressed mood: Secondary | ICD-10-CM | POA: Diagnosis not present

## 2023-12-08 DIAGNOSIS — M4316 Spondylolisthesis, lumbar region: Secondary | ICD-10-CM | POA: Diagnosis not present

## 2023-12-08 DIAGNOSIS — R1310 Dysphagia, unspecified: Secondary | ICD-10-CM | POA: Diagnosis not present

## 2023-12-08 DIAGNOSIS — M4726 Other spondylosis with radiculopathy, lumbar region: Secondary | ICD-10-CM | POA: Diagnosis not present

## 2023-12-08 DIAGNOSIS — F17210 Nicotine dependence, cigarettes, uncomplicated: Secondary | ICD-10-CM | POA: Diagnosis not present

## 2023-12-08 DIAGNOSIS — M8008XD Age-related osteoporosis with current pathological fracture, vertebra(e), subsequent encounter for fracture with routine healing: Secondary | ICD-10-CM | POA: Diagnosis not present

## 2023-12-08 DIAGNOSIS — I951 Orthostatic hypotension: Secondary | ICD-10-CM | POA: Diagnosis not present

## 2023-12-08 DIAGNOSIS — G4750 Parasomnia, unspecified: Secondary | ICD-10-CM | POA: Diagnosis not present

## 2023-12-08 DIAGNOSIS — K2100 Gastro-esophageal reflux disease with esophagitis, without bleeding: Secondary | ICD-10-CM | POA: Diagnosis not present

## 2023-12-08 DIAGNOSIS — H53419 Scotoma involving central area, unspecified eye: Secondary | ICD-10-CM | POA: Diagnosis not present

## 2023-12-08 DIAGNOSIS — M47812 Spondylosis without myelopathy or radiculopathy, cervical region: Secondary | ICD-10-CM | POA: Diagnosis not present

## 2023-12-08 DIAGNOSIS — I2510 Atherosclerotic heart disease of native coronary artery without angina pectoris: Secondary | ICD-10-CM | POA: Diagnosis not present

## 2023-12-08 DIAGNOSIS — M5116 Intervertebral disc disorders with radiculopathy, lumbar region: Secondary | ICD-10-CM | POA: Diagnosis not present

## 2023-12-08 DIAGNOSIS — K5730 Diverticulosis of large intestine without perforation or abscess without bleeding: Secondary | ICD-10-CM | POA: Diagnosis not present

## 2023-12-08 DIAGNOSIS — Z48812 Encounter for surgical aftercare following surgery on the circulatory system: Secondary | ICD-10-CM | POA: Diagnosis not present

## 2023-12-08 DIAGNOSIS — M5030 Other cervical disc degeneration, unspecified cervical region: Secondary | ICD-10-CM | POA: Diagnosis not present

## 2023-12-08 DIAGNOSIS — I10 Essential (primary) hypertension: Secondary | ICD-10-CM | POA: Diagnosis not present

## 2023-12-08 NOTE — Telephone Encounter (Signed)
 Called patient no answer, left a vm to inform her that she has an appt with Dr. Waddell and will do her pre-op that day.

## 2023-12-08 NOTE — Telephone Encounter (Signed)
 Patient called back and was informed that she has an appt with Dr. Waddell 12/31/23 and will have her clearance that day. Patient understood.

## 2023-12-09 DIAGNOSIS — Z23 Encounter for immunization: Secondary | ICD-10-CM | POA: Diagnosis not present

## 2023-12-09 DIAGNOSIS — I10 Essential (primary) hypertension: Secondary | ICD-10-CM | POA: Diagnosis not present

## 2023-12-10 DIAGNOSIS — G4750 Parasomnia, unspecified: Secondary | ICD-10-CM | POA: Diagnosis not present

## 2023-12-10 DIAGNOSIS — G8929 Other chronic pain: Secondary | ICD-10-CM | POA: Diagnosis not present

## 2023-12-10 DIAGNOSIS — H53419 Scotoma involving central area, unspecified eye: Secondary | ICD-10-CM | POA: Diagnosis not present

## 2023-12-10 DIAGNOSIS — R1310 Dysphagia, unspecified: Secondary | ICD-10-CM | POA: Diagnosis not present

## 2023-12-10 DIAGNOSIS — Z48812 Encounter for surgical aftercare following surgery on the circulatory system: Secondary | ICD-10-CM | POA: Diagnosis not present

## 2023-12-10 DIAGNOSIS — K2100 Gastro-esophageal reflux disease with esophagitis, without bleeding: Secondary | ICD-10-CM | POA: Diagnosis not present

## 2023-12-10 DIAGNOSIS — G3184 Mild cognitive impairment, so stated: Secondary | ICD-10-CM | POA: Diagnosis not present

## 2023-12-10 DIAGNOSIS — M48061 Spinal stenosis, lumbar region without neurogenic claudication: Secondary | ICD-10-CM | POA: Diagnosis not present

## 2023-12-10 DIAGNOSIS — M47812 Spondylosis without myelopathy or radiculopathy, cervical region: Secondary | ICD-10-CM | POA: Diagnosis not present

## 2023-12-10 DIAGNOSIS — I10 Essential (primary) hypertension: Secondary | ICD-10-CM | POA: Diagnosis not present

## 2023-12-10 DIAGNOSIS — M4726 Other spondylosis with radiculopathy, lumbar region: Secondary | ICD-10-CM | POA: Diagnosis not present

## 2023-12-10 DIAGNOSIS — I2510 Atherosclerotic heart disease of native coronary artery without angina pectoris: Secondary | ICD-10-CM | POA: Diagnosis not present

## 2023-12-10 DIAGNOSIS — J449 Chronic obstructive pulmonary disease, unspecified: Secondary | ICD-10-CM | POA: Diagnosis not present

## 2023-12-10 DIAGNOSIS — K5730 Diverticulosis of large intestine without perforation or abscess without bleeding: Secondary | ICD-10-CM | POA: Diagnosis not present

## 2023-12-10 DIAGNOSIS — M8008XD Age-related osteoporosis with current pathological fracture, vertebra(e), subsequent encounter for fracture with routine healing: Secondary | ICD-10-CM | POA: Diagnosis not present

## 2023-12-10 DIAGNOSIS — I951 Orthostatic hypotension: Secondary | ICD-10-CM | POA: Diagnosis not present

## 2023-12-10 DIAGNOSIS — M4316 Spondylolisthesis, lumbar region: Secondary | ICD-10-CM | POA: Diagnosis not present

## 2023-12-10 DIAGNOSIS — F17210 Nicotine dependence, cigarettes, uncomplicated: Secondary | ICD-10-CM | POA: Diagnosis not present

## 2023-12-10 DIAGNOSIS — M5030 Other cervical disc degeneration, unspecified cervical region: Secondary | ICD-10-CM | POA: Diagnosis not present

## 2023-12-10 DIAGNOSIS — G43111 Migraine with aura, intractable, with status migrainosus: Secondary | ICD-10-CM | POA: Diagnosis not present

## 2023-12-10 DIAGNOSIS — M5116 Intervertebral disc disorders with radiculopathy, lumbar region: Secondary | ICD-10-CM | POA: Diagnosis not present

## 2023-12-10 DIAGNOSIS — F515 Nightmare disorder: Secondary | ICD-10-CM | POA: Diagnosis not present

## 2023-12-10 DIAGNOSIS — F4321 Adjustment disorder with depressed mood: Secondary | ICD-10-CM | POA: Diagnosis not present

## 2023-12-10 DIAGNOSIS — E782 Mixed hyperlipidemia: Secondary | ICD-10-CM | POA: Diagnosis not present

## 2023-12-10 DIAGNOSIS — I739 Peripheral vascular disease, unspecified: Secondary | ICD-10-CM | POA: Diagnosis not present

## 2023-12-11 ENCOUNTER — Telehealth: Payer: Self-pay | Admitting: Internal Medicine

## 2023-12-11 NOTE — Telephone Encounter (Signed)
 Explained to patient that if she is going to be gone for more than 5-7 days we do recommend she take it with her.   States she is only going to be gone for 3 days.  She is aware that she does not need to take it for that short of a trip unless she just would want to.   No other questions - thanks me for the call back.

## 2023-12-11 NOTE — Telephone Encounter (Signed)
 Received from [employer]:  Alert remote transmission: Symptom 1 symptom activation c/w SR/ST, route to triage for repeat symptom activation's ___________________________________________________________________________  Reviewed transmission: Presenting appears to be SR w/ PAC.  Otherwise patient appears to be in SR/ ST.   I called and spoke with the patient to inquire what she was feeling in relation to her symptom activation. Per Ms. Mullany, she felt like her heart was racing and she did experience some SOB & dizziness that resolved quickly.   Inquired if she was having any increased stress or possible dehydration yesterday. Ms. Castronovo advised that her daughter recently passed away and she was prescribed duloxetine 20 mg every day- she took the 1st dose yesterday around 11 am.  She also advised that she didn't feel quite right most of the day and read that duloxetine can cause tachycardia.  She is hesitant to take another dose of this.  I advised the patient that I will forward to our pharmacy team to advise further regarding duloxetine and that we will call back once further recommendations are received. Otherwise, her tracings appear to be benign and we will continue to monitor.   The patient voices understanding and is agreeable.

## 2023-12-11 NOTE — Telephone Encounter (Signed)
 Pt calling in regards to her loop recorder. States device company advised her to leave monitor at home while she is on vacation, calling to verify with clinician. Please advise.

## 2023-12-15 NOTE — Telephone Encounter (Signed)
 Forwarding to Dr. Adrian covering RN to follow up on medication question after received from pharmacy.

## 2023-12-15 NOTE — Telephone Encounter (Signed)
 Good afternoon!  Duloxetine does have tachycardia and palpitations listed as a rare adverse effect, as with other SNRI agents. While it is possible that the PACs were induced by the medication, it seems unlikely especially given she only took 1 dose and wouldn't have been at therapeutic levels. This could likely be stress-induced with the recent passing of her daughter.   If she remains anxious and doesn't want to take duloxetine, could consider trialing an SSRI instead per her PCP, Dr. Murrel, discretion.   Please let us  know if there are any further questions!  Nidia Schaffer, PharmD PGY2 Cardiology Pharmacy Resident

## 2023-12-17 DIAGNOSIS — J449 Chronic obstructive pulmonary disease, unspecified: Secondary | ICD-10-CM | POA: Diagnosis not present

## 2023-12-17 DIAGNOSIS — Z48812 Encounter for surgical aftercare following surgery on the circulatory system: Secondary | ICD-10-CM | POA: Diagnosis not present

## 2023-12-17 DIAGNOSIS — I2510 Atherosclerotic heart disease of native coronary artery without angina pectoris: Secondary | ICD-10-CM | POA: Diagnosis not present

## 2023-12-17 DIAGNOSIS — F17210 Nicotine dependence, cigarettes, uncomplicated: Secondary | ICD-10-CM | POA: Diagnosis not present

## 2023-12-17 DIAGNOSIS — M4726 Other spondylosis with radiculopathy, lumbar region: Secondary | ICD-10-CM | POA: Diagnosis not present

## 2023-12-17 DIAGNOSIS — R1310 Dysphagia, unspecified: Secondary | ICD-10-CM | POA: Diagnosis not present

## 2023-12-17 DIAGNOSIS — G8929 Other chronic pain: Secondary | ICD-10-CM | POA: Diagnosis not present

## 2023-12-17 DIAGNOSIS — K2100 Gastro-esophageal reflux disease with esophagitis, without bleeding: Secondary | ICD-10-CM | POA: Diagnosis not present

## 2023-12-17 DIAGNOSIS — M4316 Spondylolisthesis, lumbar region: Secondary | ICD-10-CM | POA: Diagnosis not present

## 2023-12-17 DIAGNOSIS — G4750 Parasomnia, unspecified: Secondary | ICD-10-CM | POA: Diagnosis not present

## 2023-12-17 DIAGNOSIS — M47812 Spondylosis without myelopathy or radiculopathy, cervical region: Secondary | ICD-10-CM | POA: Diagnosis not present

## 2023-12-17 DIAGNOSIS — M8008XD Age-related osteoporosis with current pathological fracture, vertebra(e), subsequent encounter for fracture with routine healing: Secondary | ICD-10-CM | POA: Diagnosis not present

## 2023-12-17 DIAGNOSIS — M48061 Spinal stenosis, lumbar region without neurogenic claudication: Secondary | ICD-10-CM | POA: Diagnosis not present

## 2023-12-17 DIAGNOSIS — H53419 Scotoma involving central area, unspecified eye: Secondary | ICD-10-CM | POA: Diagnosis not present

## 2023-12-17 DIAGNOSIS — I739 Peripheral vascular disease, unspecified: Secondary | ICD-10-CM | POA: Diagnosis not present

## 2023-12-17 DIAGNOSIS — G3184 Mild cognitive impairment, so stated: Secondary | ICD-10-CM | POA: Diagnosis not present

## 2023-12-17 DIAGNOSIS — F515 Nightmare disorder: Secondary | ICD-10-CM | POA: Diagnosis not present

## 2023-12-17 DIAGNOSIS — G43111 Migraine with aura, intractable, with status migrainosus: Secondary | ICD-10-CM | POA: Diagnosis not present

## 2023-12-17 DIAGNOSIS — F4321 Adjustment disorder with depressed mood: Secondary | ICD-10-CM | POA: Diagnosis not present

## 2023-12-17 DIAGNOSIS — M5030 Other cervical disc degeneration, unspecified cervical region: Secondary | ICD-10-CM | POA: Diagnosis not present

## 2023-12-17 DIAGNOSIS — M5116 Intervertebral disc disorders with radiculopathy, lumbar region: Secondary | ICD-10-CM | POA: Diagnosis not present

## 2023-12-17 DIAGNOSIS — I951 Orthostatic hypotension: Secondary | ICD-10-CM | POA: Diagnosis not present

## 2023-12-17 DIAGNOSIS — E782 Mixed hyperlipidemia: Secondary | ICD-10-CM | POA: Diagnosis not present

## 2023-12-17 DIAGNOSIS — K5730 Diverticulosis of large intestine without perforation or abscess without bleeding: Secondary | ICD-10-CM | POA: Diagnosis not present

## 2023-12-17 DIAGNOSIS — I10 Essential (primary) hypertension: Secondary | ICD-10-CM | POA: Diagnosis not present

## 2023-12-23 DIAGNOSIS — F515 Nightmare disorder: Secondary | ICD-10-CM | POA: Diagnosis not present

## 2023-12-23 DIAGNOSIS — J449 Chronic obstructive pulmonary disease, unspecified: Secondary | ICD-10-CM | POA: Diagnosis not present

## 2023-12-23 DIAGNOSIS — M5116 Intervertebral disc disorders with radiculopathy, lumbar region: Secondary | ICD-10-CM | POA: Diagnosis not present

## 2023-12-23 DIAGNOSIS — I951 Orthostatic hypotension: Secondary | ICD-10-CM | POA: Diagnosis not present

## 2023-12-23 DIAGNOSIS — M8008XD Age-related osteoporosis with current pathological fracture, vertebra(e), subsequent encounter for fracture with routine healing: Secondary | ICD-10-CM | POA: Diagnosis not present

## 2023-12-23 DIAGNOSIS — K2100 Gastro-esophageal reflux disease with esophagitis, without bleeding: Secondary | ICD-10-CM | POA: Diagnosis not present

## 2023-12-23 DIAGNOSIS — H53419 Scotoma involving central area, unspecified eye: Secondary | ICD-10-CM | POA: Diagnosis not present

## 2023-12-23 DIAGNOSIS — I2510 Atherosclerotic heart disease of native coronary artery without angina pectoris: Secondary | ICD-10-CM | POA: Diagnosis not present

## 2023-12-23 DIAGNOSIS — I739 Peripheral vascular disease, unspecified: Secondary | ICD-10-CM | POA: Diagnosis not present

## 2023-12-23 DIAGNOSIS — G3184 Mild cognitive impairment, so stated: Secondary | ICD-10-CM | POA: Diagnosis not present

## 2023-12-23 DIAGNOSIS — M5030 Other cervical disc degeneration, unspecified cervical region: Secondary | ICD-10-CM | POA: Diagnosis not present

## 2023-12-23 DIAGNOSIS — K5730 Diverticulosis of large intestine without perforation or abscess without bleeding: Secondary | ICD-10-CM | POA: Diagnosis not present

## 2023-12-23 DIAGNOSIS — I10 Essential (primary) hypertension: Secondary | ICD-10-CM | POA: Diagnosis not present

## 2023-12-23 DIAGNOSIS — G4750 Parasomnia, unspecified: Secondary | ICD-10-CM | POA: Diagnosis not present

## 2023-12-23 DIAGNOSIS — Z48812 Encounter for surgical aftercare following surgery on the circulatory system: Secondary | ICD-10-CM | POA: Diagnosis not present

## 2023-12-23 DIAGNOSIS — F17210 Nicotine dependence, cigarettes, uncomplicated: Secondary | ICD-10-CM | POA: Diagnosis not present

## 2023-12-23 DIAGNOSIS — M47812 Spondylosis without myelopathy or radiculopathy, cervical region: Secondary | ICD-10-CM | POA: Diagnosis not present

## 2023-12-23 DIAGNOSIS — G43111 Migraine with aura, intractable, with status migrainosus: Secondary | ICD-10-CM | POA: Diagnosis not present

## 2023-12-23 DIAGNOSIS — M48061 Spinal stenosis, lumbar region without neurogenic claudication: Secondary | ICD-10-CM | POA: Diagnosis not present

## 2023-12-23 DIAGNOSIS — G8929 Other chronic pain: Secondary | ICD-10-CM | POA: Diagnosis not present

## 2023-12-23 DIAGNOSIS — F4321 Adjustment disorder with depressed mood: Secondary | ICD-10-CM | POA: Diagnosis not present

## 2023-12-23 DIAGNOSIS — M4726 Other spondylosis with radiculopathy, lumbar region: Secondary | ICD-10-CM | POA: Diagnosis not present

## 2023-12-23 DIAGNOSIS — R1310 Dysphagia, unspecified: Secondary | ICD-10-CM | POA: Diagnosis not present

## 2023-12-23 DIAGNOSIS — E782 Mixed hyperlipidemia: Secondary | ICD-10-CM | POA: Diagnosis not present

## 2023-12-23 DIAGNOSIS — M4316 Spondylolisthesis, lumbar region: Secondary | ICD-10-CM | POA: Diagnosis not present

## 2023-12-29 ENCOUNTER — Ambulatory Visit

## 2023-12-29 ENCOUNTER — Encounter

## 2023-12-29 DIAGNOSIS — R55 Syncope and collapse: Secondary | ICD-10-CM | POA: Diagnosis not present

## 2023-12-29 LAB — CUP PACEART REMOTE DEVICE CHECK
Date Time Interrogation Session: 20251005234302
Implantable Pulse Generator Implant Date: 20250903

## 2023-12-30 NOTE — Progress Notes (Signed)
 Remote Loop Recorder Transmission

## 2023-12-31 ENCOUNTER — Encounter: Payer: Self-pay | Admitting: Internal Medicine

## 2023-12-31 ENCOUNTER — Ambulatory Visit: Attending: Cardiology | Admitting: Internal Medicine

## 2023-12-31 VITALS — BP 134/100 | HR 64 | Ht 66.0 in | Wt 127.3 lb

## 2023-12-31 DIAGNOSIS — R931 Abnormal findings on diagnostic imaging of heart and coronary circulation: Secondary | ICD-10-CM

## 2023-12-31 DIAGNOSIS — R55 Syncope and collapse: Secondary | ICD-10-CM

## 2023-12-31 NOTE — Patient Instructions (Signed)
   Follow-Up: At [facility], you and your health needs are our priority.  As part of our continuing mission to provide you with exceptional heart care, our providers are all part of one team.  This team includes your primary Cardiologist (physician) and Advanced Practice Providers or APPs (Physician Assistants and Nurse Practitioners) who all work together to provide you with the care you need, when you need it.  Your next appointment:   6 month(s)  Provider:   DONNICE PRIMUS MD

## 2023-12-31 NOTE — Progress Notes (Signed)
 HPI [name] returns today for followup. She is a pleasant 83 yo woman with a h/o HTN, and syncope and malaise who presents today for  followup. She underwent ILR insertion 5 weeks ago. She has known PAC's. She describes an episode where she got dizzy after going out for lunch, standing up after sitting for a while. No palpitations. She eventually improved. She is pending spine surgery. She has stopped smoking for 2 weeks. She has cut back on her alcohol  and has tried to not miss any meals.   Allergies  Allergen Reactions   Lidocaine  Rash and Other (See Comments)    Affected the blood pressure- almost passed out also   Gabapentin Nausea Only    Stomach pain   Novocain [Procaine] Other (See Comments)    An injection at the dentist's caused salivation for 90+ days afterwards.     Current Outpatient Medications  Medication Sig Dispense Refill   acetaminophen  (TYLENOL ) 500 MG tablet Take 500-1,000 mg by mouth every 6 (six) hours as needed for mild pain (pain score 1-3).     albuterol  (VENTOLIN  HFA) 108 (90 Base) MCG/ACT inhaler Inhale 2 puffs into the lungs every 4 (four) hours as needed for wheezing or shortness of breath. (Patient not taking: Reported on 12/31/2023)     amLODipine  (NORVASC ) 2.5 MG tablet Take 2.5 mg by mouth daily as needed (Systolic blood pressure >150).     aspirin  EC 81 MG tablet Take 81 mg by mouth daily.     CALCIUM  PO Take 1 tablet by mouth daily after breakfast.     Cholecalciferol (VITAMIN D3 PO) Take 1 capsule by mouth daily after breakfast.     Cyanocobalamin  (VITAMIN B12 PO) Take 1 tablet by mouth daily after breakfast.     donepezil  (ARICEPT ) 10 MG tablet Take 1 tablet (10 mg total) by mouth daily. 90 tablet 3   ibuprofen  (ADVIL ) 200 MG tablet Take 600-800 mg by mouth 2 (two) times daily as needed for mild pain (pain score 1-3).     ipratropium (ATROVENT ) 0.06 % nasal spray Place 2 sprays into the nose daily.     lidocaine  (LIDODERM ) 5 % Place 1 patch  onto the skin daily. Remove & Discard patch within 12 hours or as directed by MD 14 patch 0   MAGNESIUM PO Take 1 tablet by mouth daily after breakfast.     meclizine  (ANTIVERT ) 12.5 MG tablet Take 12.5 mg by mouth 3 (three) times daily as needed for dizziness.     pregabalin (LYRICA) 75 MG capsule Take 75 mg by mouth 2 (two) times daily.     rosuvastatin  (CRESTOR ) 10 MG tablet Take 10 mg by mouth daily.     No current facility-administered medications for this visit.     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   Past Surgical History:  Procedure Laterality Date   CARPAL TUNNEL RELEASE Right    CATARACT EXTRACTION, BILATERAL Bilateral    LOOP RECORDER INSERTION N/A 11/26/2023   Procedure: LOOP RECORDER INSERTION;  Surgeon: Waddell Danelle ORN, MD;  Location: [facility] INVASIVE CV [facility];  Service: Cardiovascular;  Laterality: N/A;     Family History  Problem Relation Age of Onset   Diabetes Mother    Heart disease Mother    Cancer Mother        type  unknown   Bone cancer Father    Diabetes Sister    Lung disease Sister    COPD Sister    Heart attack Paternal Grandfather    Down syndrome Daughter    Dementia Daughter    Other Daughter        colon surgery     Social History   Socioeconomic History   Marital status: Divorced    Spouse name: Not on file   Number of children: 3   Years of education: 16   Highest education level: Not on file  Occupational History   Occupation: retired  Tobacco Use   Smoking status: Every Day    Current packs/day: 0.50    Types: Cigarettes    Passive exposure: Never   Smokeless tobacco: Never  Vaping Use   Vaping status: Never Used  Substance and Sexual Activity   Alcohol  use: Yes    Alcohol /week: 5.0 standard drinks of alcohol     Types: 5 Glasses of wine per week    Comment: 1 glass of wine nightly   Drug use: Yes    Types: Other-see comments    Comment: CBD oil at night on occasion   Sexual activity: Not on file  Other Topics Concern   Not on file  Social History Narrative   Drinks 3-4 cups of caffeine daily.   Social Drivers of Corporate investment banker Strain: Low Risk  (11/06/2023)   Received from [facility]   Overall Financial Resource Strain (CARDIA)    How hard is it for you to pay for the very basics like food, housing, medical care, and heating?: Not hard at all  Food Insecurity: No Food Insecurity (11/26/2023)   Hunger Vital Sign    Worried About Running Out of Food in the Last Year: Never true    Ran Out of Food in the Last Year: Never true  Transportation Needs: No Transportation Needs (11/26/2023)   PRAPARE - Administrator, Civil Service (Medical): No    Lack of Transportation (Non-Medical): No  Physical Activity: Not on file  Stress: Not on file  Social Connections: Unknown (11/26/2023)   Social Connection and Isolation Panel    Frequency of Communication with Friends and Family: Patient declined    Frequency of Social Gatherings with Friends and  Family: Patient declined    Attends Religious Services: Patient declined    Database administrator or Organizations: Patient declined    Attends Banker Meetings: Patient declined    Marital Status: Divorced  Catering manager Violence: Not At Risk (11/26/2023)   Humiliation, Afraid, Rape, and  Kick questionnaire    Fear of Current or Ex-Partner: No    Emotionally Abused: No    Physically Abused: No    Sexually Abused: No     BP (!) 134/100 (BP Location: Left Arm, Patient Position: Sitting, Cuff Size: Normal)   Pulse 64   Ht 5' 6 (1.676 m)   Wt 127 lb 4.8 oz (57.7 kg)   SpO2 97%   BMI 20.55 kg/m   Physical Exam:  Well appearing NAD HEENT: Unremarkable Neck:  No JVD, no thyromegally Lymphatics:  No adenopathy Back:  No CVA tenderness Lungs:  Clear HEART:  Regular rate rhythm, no murmurs, no rubs, no clicks Abd:  soft, positive bowel sounds, no organomegally, no rebound, no guarding Ext:  2 plus pulses, no edema, no cyanosis, no clubbing Skin:  No rashes no nodules Neuro:  CN II through XII intact, motor grossly intact  EKG - nsr  Assess/Plan: Syncope - I suspect that this is due to orthostasis. So far the ILR is unremarkable.  PAC's/NS SVT - her symptoms have improved. No indication for AA drug therapy at this point. ILR - her device healed ok. Appears to be working normally.  Danelle Kalijah Zeiss,MD

## 2024-01-01 ENCOUNTER — Ambulatory Visit: Payer: Self-pay | Admitting: Internal Medicine

## 2024-01-01 DIAGNOSIS — I739 Peripheral vascular disease, unspecified: Secondary | ICD-10-CM | POA: Diagnosis not present

## 2024-01-01 DIAGNOSIS — G4750 Parasomnia, unspecified: Secondary | ICD-10-CM | POA: Diagnosis not present

## 2024-01-01 DIAGNOSIS — G3184 Mild cognitive impairment, so stated: Secondary | ICD-10-CM | POA: Diagnosis not present

## 2024-01-01 DIAGNOSIS — I10 Essential (primary) hypertension: Secondary | ICD-10-CM | POA: Diagnosis not present

## 2024-01-01 DIAGNOSIS — F515 Nightmare disorder: Secondary | ICD-10-CM | POA: Diagnosis not present

## 2024-01-01 DIAGNOSIS — H53419 Scotoma involving central area, unspecified eye: Secondary | ICD-10-CM | POA: Diagnosis not present

## 2024-01-01 DIAGNOSIS — I2510 Atherosclerotic heart disease of native coronary artery without angina pectoris: Secondary | ICD-10-CM | POA: Diagnosis not present

## 2024-01-01 DIAGNOSIS — R1310 Dysphagia, unspecified: Secondary | ICD-10-CM | POA: Diagnosis not present

## 2024-01-01 DIAGNOSIS — F4321 Adjustment disorder with depressed mood: Secondary | ICD-10-CM | POA: Diagnosis not present

## 2024-01-01 DIAGNOSIS — M4726 Other spondylosis with radiculopathy, lumbar region: Secondary | ICD-10-CM | POA: Diagnosis not present

## 2024-01-01 DIAGNOSIS — G8929 Other chronic pain: Secondary | ICD-10-CM | POA: Diagnosis not present

## 2024-01-01 DIAGNOSIS — Z48812 Encounter for surgical aftercare following surgery on the circulatory system: Secondary | ICD-10-CM | POA: Diagnosis not present

## 2024-01-01 DIAGNOSIS — F17210 Nicotine dependence, cigarettes, uncomplicated: Secondary | ICD-10-CM | POA: Diagnosis not present

## 2024-01-01 DIAGNOSIS — E782 Mixed hyperlipidemia: Secondary | ICD-10-CM | POA: Diagnosis not present

## 2024-01-01 DIAGNOSIS — M5030 Other cervical disc degeneration, unspecified cervical region: Secondary | ICD-10-CM | POA: Diagnosis not present

## 2024-01-01 DIAGNOSIS — J449 Chronic obstructive pulmonary disease, unspecified: Secondary | ICD-10-CM | POA: Diagnosis not present

## 2024-01-01 DIAGNOSIS — M47812 Spondylosis without myelopathy or radiculopathy, cervical region: Secondary | ICD-10-CM | POA: Diagnosis not present

## 2024-01-01 DIAGNOSIS — I951 Orthostatic hypotension: Secondary | ICD-10-CM | POA: Diagnosis not present

## 2024-01-01 DIAGNOSIS — K2100 Gastro-esophageal reflux disease with esophagitis, without bleeding: Secondary | ICD-10-CM | POA: Diagnosis not present

## 2024-01-01 DIAGNOSIS — K5730 Diverticulosis of large intestine without perforation or abscess without bleeding: Secondary | ICD-10-CM | POA: Diagnosis not present

## 2024-01-01 DIAGNOSIS — M48061 Spinal stenosis, lumbar region without neurogenic claudication: Secondary | ICD-10-CM | POA: Diagnosis not present

## 2024-01-01 DIAGNOSIS — M4316 Spondylolisthesis, lumbar region: Secondary | ICD-10-CM | POA: Diagnosis not present

## 2024-01-01 DIAGNOSIS — M5116 Intervertebral disc disorders with radiculopathy, lumbar region: Secondary | ICD-10-CM | POA: Diagnosis not present

## 2024-01-01 DIAGNOSIS — G43111 Migraine with aura, intractable, with status migrainosus: Secondary | ICD-10-CM | POA: Diagnosis not present

## 2024-01-01 DIAGNOSIS — M8008XD Age-related osteoporosis with current pathological fracture, vertebra(e), subsequent encounter for fracture with routine healing: Secondary | ICD-10-CM | POA: Diagnosis not present

## 2024-01-06 DIAGNOSIS — Z23 Encounter for immunization: Secondary | ICD-10-CM | POA: Diagnosis not present

## 2024-01-06 DIAGNOSIS — R55 Syncope and collapse: Secondary | ICD-10-CM | POA: Diagnosis not present

## 2024-01-06 DIAGNOSIS — I129 Hypertensive chronic kidney disease with stage 1 through stage 4 chronic kidney disease, or unspecified chronic kidney disease: Secondary | ICD-10-CM | POA: Diagnosis not present

## 2024-01-08 ENCOUNTER — Encounter

## 2024-01-09 DIAGNOSIS — M8008XD Age-related osteoporosis with current pathological fracture, vertebra(e), subsequent encounter for fracture with routine healing: Secondary | ICD-10-CM | POA: Diagnosis not present

## 2024-01-14 ENCOUNTER — Emergency Department (HOSPITAL_COMMUNITY)

## 2024-01-14 ENCOUNTER — Other Ambulatory Visit: Payer: Self-pay

## 2024-01-14 ENCOUNTER — Encounter (HOSPITAL_COMMUNITY): Payer: Self-pay

## 2024-01-14 ENCOUNTER — Emergency Department (HOSPITAL_COMMUNITY)
Admission: EM | Admit: 2024-01-14 | Discharge: 2024-01-15 | Disposition: A | Discharge status: Home / Self Care | Attending: Emergency Medicine | Admitting: Emergency Medicine

## 2024-01-14 DIAGNOSIS — Z79899 Other long term (current) drug therapy: Secondary | ICD-10-CM | POA: Diagnosis not present

## 2024-01-14 DIAGNOSIS — J449 Chronic obstructive pulmonary disease, unspecified: Secondary | ICD-10-CM | POA: Insufficient documentation

## 2024-01-14 DIAGNOSIS — I2510 Atherosclerotic heart disease of native coronary artery without angina pectoris: Secondary | ICD-10-CM | POA: Insufficient documentation

## 2024-01-14 DIAGNOSIS — R404 Transient alteration of awareness: Secondary | ICD-10-CM | POA: Diagnosis not present

## 2024-01-14 DIAGNOSIS — Z7982 Long term (current) use of aspirin: Secondary | ICD-10-CM | POA: Insufficient documentation

## 2024-01-14 DIAGNOSIS — Z85828 Personal history of other malignant neoplasm of skin: Secondary | ICD-10-CM | POA: Insufficient documentation

## 2024-01-14 DIAGNOSIS — Y906 Blood alcohol level of 120-199 mg/100 ml: Secondary | ICD-10-CM | POA: Insufficient documentation

## 2024-01-14 DIAGNOSIS — R0602 Shortness of breath: Secondary | ICD-10-CM | POA: Diagnosis not present

## 2024-01-14 DIAGNOSIS — I700 Atherosclerosis of aorta: Secondary | ICD-10-CM | POA: Diagnosis not present

## 2024-01-14 DIAGNOSIS — I10 Essential (primary) hypertension: Secondary | ICD-10-CM | POA: Insufficient documentation

## 2024-01-14 DIAGNOSIS — F10920 Alcohol use, unspecified with intoxication, uncomplicated: Secondary | ICD-10-CM | POA: Diagnosis not present

## 2024-01-14 DIAGNOSIS — R4182 Altered mental status, unspecified: Secondary | ICD-10-CM | POA: Diagnosis not present

## 2024-01-14 DIAGNOSIS — F10129 Alcohol abuse with intoxication, unspecified: Secondary | ICD-10-CM | POA: Diagnosis not present

## 2024-01-14 LAB — CBC WITH DIFFERENTIAL/PLATELET
Abs Immature Granulocytes: 0.01 K/uL (ref 0.00–0.07)
Basophils Absolute: 0.1 K/uL (ref 0.0–0.1)
Basophils Relative: 1 %
Eosinophils Absolute: 0.2 K/uL (ref 0.0–0.5)
Eosinophils Relative: 3 %
HCT: 36.2 % (ref 36.0–46.0)
Hemoglobin: 12.3 g/dL (ref 12.0–15.0)
Immature Granulocytes: 0 %
Lymphocytes Relative: 47 %
Lymphs Abs: 3 K/uL (ref 0.7–4.0)
MCH: 33.2 pg (ref 26.0–34.0)
MCHC: 34 g/dL (ref 30.0–36.0)
MCV: 97.6 fL (ref 80.0–100.0)
Monocytes Absolute: 0.5 K/uL (ref 0.1–1.0)
Monocytes Relative: 8 %
Neutro Abs: 2.5 K/uL (ref 1.7–7.7)
Neutrophils Relative %: 41 %
Platelets: 227 K/uL (ref 150–400)
RBC: 3.71 MIL/uL — ABNORMAL LOW (ref 3.87–5.11)
RDW: 12 % (ref 11.5–15.5)
WBC: 6.3 K/uL (ref 4.0–10.5)
nRBC: 0 % (ref 0.0–0.2)

## 2024-01-14 LAB — COMPREHENSIVE METABOLIC PANEL WITH GFR
ALT: 20 U/L (ref 0–44)
AST: 20 U/L (ref 15–41)
Albumin: 3.6 g/dL (ref 3.5–5.0)
Alkaline Phosphatase: 47 U/L (ref 38–126)
Anion gap: 11 (ref 5–15)
BUN: 12 mg/dL (ref 8–23)
CO2: 22 mmol/L (ref 22–32)
Calcium: 8.8 mg/dL — ABNORMAL LOW (ref 8.9–10.3)
Chloride: 96 mmol/L — ABNORMAL LOW (ref 98–111)
Creatinine, Ser: 0.92 mg/dL (ref 0.44–1.00)
GFR, Estimated: >60 mL/min (ref >60)
Glucose, Bld: 95 mg/dL (ref 70–99)
Potassium: 3.8 mmol/L (ref 3.5–5.1)
Sodium: 129 mmol/L — ABNORMAL LOW (ref 135–145)
Total Bilirubin: 0.5 mg/dL (ref 0.0–1.2)
Total Protein: 6 g/dL — ABNORMAL LOW (ref 6.5–8.1)

## 2024-01-14 LAB — I-STAT CHEM 8, ED
BUN: 12 mg/dL (ref 8–23)
Calcium, Ion: 1.12 mmol/L — ABNORMAL LOW (ref 1.15–1.40)
Chloride: 97 mmol/L — ABNORMAL LOW (ref 98–111)
Creatinine, Ser: 1.2 mg/dL — ABNORMAL HIGH (ref 0.44–1.00)
Glucose, Bld: 99 mg/dL (ref 70–99)
HCT: 36 % (ref 36.0–46.0)
Hemoglobin: 12.2 g/dL (ref 12.0–15.0)
Potassium: 4 mmol/L (ref 3.5–5.1)
Sodium: 131 mmol/L — ABNORMAL LOW (ref 135–145)
TCO2: 22 mmol/L (ref 22–32)

## 2024-01-14 LAB — URINALYSIS, ROUTINE W REFLEX MICROSCOPIC
Bilirubin Urine: NEGATIVE
Glucose, UA: NEGATIVE mg/dL
Hgb urine dipstick: NEGATIVE
Ketones, ur: NEGATIVE mg/dL
Leukocytes,Ua: NEGATIVE
Nitrite: NEGATIVE
Protein, ur: NEGATIVE mg/dL
Specific Gravity, Urine: 1.002 — ABNORMAL LOW (ref 1.005–1.030)
pH: 5 (ref 5.0–8.0)

## 2024-01-14 LAB — CBG MONITORING, ED: Glucose-Capillary: 101 mg/dL — ABNORMAL HIGH (ref 70–99)

## 2024-01-14 LAB — ETHANOL: Alcohol, Ethyl (B): 143 mg/dL — ABNORMAL HIGH (ref <15)

## 2024-01-14 LAB — RESP PANEL BY RT-PCR (RSV, FLU A&B, COVID)  RVPGX2
Influenza A by PCR: NEGATIVE
Influenza B by PCR: NEGATIVE
Resp Syncytial Virus by PCR: NEGATIVE
SARS Coronavirus 2 by RT PCR: NEGATIVE

## 2024-01-14 NOTE — ED Provider Triage Note (Signed)
 Emergency Medicine Provider Triage Evaluation Note  Kara Tapia , a 83 y.o. female  was evaluated in triage.  Pt complains of altered mental status.  Daughter accompanies patient.  Daughter flew in from out of town.  For spoke to her around 530 however patient later was not aware that daughter was coming into town and was surprised to see her but according to daughter she told her that she was taking a flight home.  Alert and oriented x 4 during our interview.  No focal neurological deficits.  No facial droop.  Normal speech.  Cranial nerves III through XII intact.  Good range of motion in bilateral upper and lower extremities.  Review of Systems  Positive: As above Negative: As above  Physical Exam  BP (!) 142/96   Pulse 73   Temp 97.6 F (36.4 C) (Oral)   Resp 18   Ht 5' 6 (1.676 m)   Wt 59 kg   SpO2 97%   BMI 20.98 kg/m  Gen:   Awake, no distress   Resp:  Normal effort  MSK:   Moves extremities without difficulty  Other:    Medical Decision Making  Medically screening exam initiated at 9:51 PM.  Appropriate orders placed.  Kara Tapia was informed that the remainder of the evaluation will be completed by another provider, this initial triage assessment does not replace that evaluation, and the importance of remaining in the ED until their evaluation is complete.     Hildegard Loge, PA-C 01/14/24 2152

## 2024-01-14 NOTE — ED Triage Notes (Signed)
 Pt reports SOB and confusion starting gradually over past couple of hours. Family reports BP reading 182/108 at home. Family reports significant confusion and having come to check on patient after getting home from airport tonight. Pt nodding off in triage. Difficulty concentrating/holding conversation per family. Patient just lost daughter a couple months ago.

## 2024-01-15 DIAGNOSIS — F10920 Alcohol use, unspecified with intoxication, uncomplicated: Secondary | ICD-10-CM | POA: Diagnosis not present

## 2024-01-15 LAB — I-STAT VENOUS BLOOD GAS, ED
Acid-Base Excess: 0 mmol/L (ref 0.0–2.0)
Bicarbonate: 23.6 mmol/L (ref 20.0–28.0)
Calcium, Ion: 1.15 mmol/L (ref 1.15–1.40)
HCT: 36 % (ref 36.0–46.0)
Hemoglobin: 12.2 g/dL (ref 12.0–15.0)
O2 Saturation: 100 %
Potassium: 3.9 mmol/L (ref 3.5–5.1)
Sodium: 133 mmol/L — ABNORMAL LOW (ref 135–145)
TCO2: 25 mmol/L (ref 22–32)
pCO2, Ven: 33.2 mmHg — ABNORMAL LOW (ref 44–60)
pH, Ven: 7.459 — ABNORMAL HIGH (ref 7.25–7.43)
pO2, Ven: 186 mmHg — ABNORMAL HIGH (ref 32–45)

## 2024-01-15 NOTE — ED Notes (Signed)
 Pt ambulated with pulse ox, O2 WNL, no difficulty noted or reported

## 2024-01-15 NOTE — ED Provider Notes (Signed)
 [facility] AT [facility] Provider Note  CSN: [id number] Arrival date & time: 01/14/24 2123  Chief Complaint(s) Altered Mental Status and Shortness of Breath  HPI BERNIECE Tapia is a 83 y.o. female with a past medical history listed below including daily alcohol  use here for altered mental status.  She is accompanied by the daughter who reports flying in from [place] and noticing her mother being more confused and short of breath with short walking.  No known falls or trauma.  Patient does admit to alcohol  consumption starting around 6 PM.  Reports that she typically drinks 2 glasses of wine each day.  She denied any increased amount of alcohol  consumption but does report recent family loss and sometimes feels like she is zoned out.  There is no focal deficits, visual disturbance.  No recent fevers or infections.  Patient was a prior smoker but quit 2 months ago.  The history is provided by the patient and a relative.    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/2014   PVD (peripheral vascular disease)    Reflux esophagitis    REM sleep behavior disorder 08/09/2014   Scotoma 08/09/2014   Sleep behavior disorder, REM 06/04/2017   Patient  Active Problem List   Diagnosis Date Noted   MGUS (monoclonal gammopathy of unknown significance) 12/02/2023   CAD in native artery 07/03/2023   Exertional dyspnea 07/03/2023   Chest pain 07/02/2023   Hyponatremia 07/02/2023   Mixed hyperlipidemia 01/09/2023   Mild memory disturbance 01/09/2023   Medicare annual wellness visit, subsequent 01/09/2023   Gastroesophageal reflux disease without esophagitis 01/09/2023   Elevated blood pressure reading 01/09/2023   Vertigo 01/09/2023   COPD mixed type (HCC) 01/09/2023   Hematuria 01/09/2023   Abnormal cervical Papanicolaou smear 01/09/2023   Senile osteoporosis 08/03/2021   Pain in joint of right shoulder 04/29/2019   Syncope 01/12/2019   Hypertension 01/12/2019   Lisping 12/02/2017   Sleep behavior disorder, REM 06/04/2017   MCI (mild cognitive impairment) 06/04/2017   Insomnia 07/06/2015   Nightmares REM-sleep type 07/06/2015   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 Authorizing Provider  acetaminophen  (TYLENOL ) 500 MG tablet Take 500-1,000 mg by mouth every 6 (six) hours as needed for mild pain (pain score 1-3).    [provider]  albuterol  (VENTOLIN  HFA) 108 (90 Base) MCG/ACT inhaler  Inhale 2 puffs into the lungs every 4 (four) hours as needed for wheezing or shortness of breath. Patient not taking: Reported on 12/31/2023 05/08/23   [provider]  amLODipine  (NORVASC ) 2.5 MG tablet Take 2.5 mg by mouth daily as needed (Systolic blood pressure >150). 08/01/23   [provider]  aspirin  EC 81 MG tablet Take  81 mg by mouth daily. 07/04/23   [provider]  CALCIUM  PO Take 1 tablet by mouth daily after breakfast.    [provider]  Cholecalciferol (VITAMIN D3 PO) Take 1 capsule by mouth daily after breakfast.    [provider]  Cyanocobalamin  (VITAMIN B12 PO) Take 1 tablet by mouth daily after breakfast.    [provider]  donepezil  (ARICEPT ) 10 MG tablet Take 1 tablet (10 mg total) by mouth daily. 04/10/23   Lomax, Amy, NP  ibuprofen  (ADVIL ) 200 MG tablet Take 600-800 mg by mouth 2 (two) times daily as needed for mild pain (pain score 1-3).    [provider]  ipratropium (ATROVENT ) 0.06 % nasal spray Place 2 sprays into the nose daily. 08/07/23   [provider]  lidocaine  (LIDODERM ) 5 % Place 1 patch onto the skin daily. Remove & Discard patch within 12 hours or as directed by MD 10/27/23   Neysa Caron PARAS, DO  MAGNESIUM PO Take 1 tablet by mouth daily after breakfast.    [provider]  meclizine  (ANTIVERT ) 12.5 MG tablet Take 12.5 mg by mouth 3 (three) times daily as needed for dizziness. 07/10/23   [provider]  pregabalin (LYRICA) 75 MG capsule Take 75 mg by mouth 2 (two) times daily. 11/04/23   [provider]  ramelteon  (ROZEREM ) 8 MG tablet Take 8 mg by mouth as needed for sleep. 11/27/23   [provider]  rosuvastatin  (CRESTOR ) 10 MG tablet Take 10 mg by mouth daily. 12/29/22   [provider]                                                                                                                                    Allergies Lidocaine , Gabapentin, and Novocain [procaine]  Review of Systems Review of Systems As noted in HPI  Physical Exam Vital Signs  I have reviewed the triage vital signs BP (!) 142/99   Pulse 61   Temp 97.9 F (36.6 C) (Oral)   Resp 19   Ht 5' 6 (1.676 m)   Wt 59 kg   SpO2 98%   BMI 20.98 kg/m   Physical Exam Vitals reviewed.  Constitutional:       General: She is not in acute distress.    Appearance: She is well-developed. She is not diaphoretic.  HENT:     Head: Normocephalic and atraumatic.     Nose: Nose normal.  Eyes:     General: No scleral icterus.       Right eye: No  discharge.        Left eye: No discharge.     Conjunctiva/sclera: Conjunctivae normal.     Pupils: Pupils are equal, round, and reactive to light.  Cardiovascular:     Rate and Rhythm: Normal rate and regular rhythm.     Heart sounds: No murmur heard.    No friction rub. No gallop.  Pulmonary:     Effort: Pulmonary effort is normal. No respiratory distress.     Breath sounds: Normal breath sounds. No stridor. No wheezing, rhonchi or rales.  Abdominal:     General: There is no distension.     Palpations: Abdomen is soft.     Tenderness: There is no abdominal tenderness.  Musculoskeletal:        General: No tenderness.     Cervical back: Normal range of motion and neck supple.  Skin:    General: Skin is warm and dry.     Findings: No erythema or rash.  Neurological:     Mental Status: She is alert and oriented to person, place, and time.     Comments: Mental Status:  Alert and oriented to person, place, and time.  Attention and concentration normal.  Speech clear.  Recent memory is intact  Cranial Nerves:  II Visual Fields: Intact to confrontation. Visual fields intact. III, IV, VI: Pupils equal and reactive to light and near. Full eye movement without nystagmus  V Facial Sensation: Normal. No weakness of masticatory muscles  VII: No facial weakness or asymmetry  VIII Auditory Acuity: Grossly normal  IX/X: The uvula is midline; the palate elevates symmetrically  XI: Normal sternocleidomastoid and trapezius strength  XII: The tongue is midline. No atrophy or fasciculations.   Motor System: Muscle Strength: 5/5 and symmetric in the upper and lower extremities. No pronation or drift.  Muscle Tone: Tone and muscle bulk are normal in the upper and  lower extremities.  Coordination: Intact finger-to-nose, heel-to-shin. No tremor.  Sensation: Intact to light touch  Gait: Routine  gait normal.      ED Results and Treatments Labs (all labs ordered are listed, but only abnormal results are displayed) Labs Reviewed  COMPREHENSIVE METABOLIC PANEL WITH GFR - Abnormal; Notable for the following components:      Result Value   Sodium 129 (*)    Chloride 96 (*)    Calcium  8.8 (*)    Total Protein 6.0 (*)    All other components within normal limits  CBC WITH DIFFERENTIAL/PLATELET - Abnormal; Notable for the following components:   RBC 3.71 (*)    All other components within normal limits  URINALYSIS, ROUTINE W REFLEX MICROSCOPIC - Abnormal; Notable for the following components:   Color, Urine COLORLESS (*)    Specific Gravity, Urine 1.002 (*)    All other components within normal limits  ETHANOL - Abnormal; Notable for the following components:   Alcohol , Ethyl (B) 143 (*)    All other components within normal limits  CBG MONITORING, ED - Abnormal; Notable for the following components:   Glucose-Capillary 101 (*)    All other components within normal limits  I-STAT CHEM 8, ED - Abnormal; Notable for the following components:   Sodium 131 (*)    Chloride 97 (*)    Creatinine, Ser 1.20 (*)    Calcium , Ion 1.12 (*)    All other components within normal limits  I-STAT VENOUS BLOOD GAS, ED - Abnormal; Notable for the following components:   pH, Ven 7.459 (*)  pCO2, Ven 33.2 (*)    pO2, Ven 186 (*)    Sodium 133 (*)    All other components within normal limits  RESP PANEL BY RT-PCR (RSV, FLU A&B, COVID)  [id number]                                                                                                                         EKG  EKG Interpretation Date/Time:    Ventricular Rate:    PR Interval:    QRS Duration:    QT Interval:    QTC Calculation:   R Axis:      Text Interpretation:         Radiology CT HEAD  WO CONTRAST Result Date: 01/14/2024 CLINICAL DATA:  Altered level of consciousness EXAM: CT HEAD WITHOUT CONTRAST TECHNIQUE: Contiguous axial images were obtained from the base of the skull through the vertex without intravenous contrast. RADIATION DOSE REDUCTION: This exam was performed according to the departmental dose-optimization program which includes automated exposure control, adjustment of the mA and/or kV according to patient size and/or use of iterative reconstruction technique. COMPARISON:  11/26/2023 FINDINGS: Brain: No acute infarct or hemorrhage. Lateral ventricles and midline structures appear unremarkable. There are no acute extra-axial fluid collections. No mass effect. Vascular: No hyperdense vessel or unexpected calcification. Skull: Normal. Negative for fracture or focal lesion. Sinuses/Orbits: No acute finding. Other: None. IMPRESSION: 1. No acute intracranial process. Electronically Signed   By: Ozell Daring M.D.   On: 01/14/2024 22:33   DG Chest 1 View Result Date: 01/14/2024 CLINICAL DATA:  Shortness of breath and altered mental status EXAM: PORTABLE CHEST 1 VIEW COMPARISON:  11/26/2023 FINDINGS: Cardiac shadow is stable. New loop recorder is seen. Aortic calcifications are noted. The lungs are clear bilaterally. No bony abnormality is noted. IMPRESSION: No acute abnormality noted. Electronically Signed   By: Oneil Devonshire M.D.   On: 01/14/2024 22:19    Medications Ordered in ED Medications - No data to display Procedures Procedures  (including critical care time) Medical Decision Making / ED Course   Medical Decision Making   Altered mental status differential diagnosis considered.  Workup below. Workup was consistent with alcohol  intoxication.  Patient was allowed to metabolize.  Able to ambulate without complication.  Workup also obtained to rule out infectious etiology which was reassuring including a negative chest x-ray and urine. Patient's labs were also  grossly reassuring.  She did have mild hyponatremia similar to prior.  Do not believe that this is a contributing factor to the patient's altered mental status.  No renal insufficiency.  No hypoglycemia. VBG obtained and ruled out hypercarbia. CT head obtained and negative for ICH or mass effect.  Patient does not have any focal deficits concerning for CVA.    Final Clinical Impression(s) / ED Diagnoses Final diagnoses:  Alcoholic intoxication without complication   The patient appears reasonably screened and/or stabilized for discharge and I doubt any other medical condition or other [facility] requiring  further screening, evaluation, or treatment in the ED at this time. I have discussed the findings, Dx and Tx plan with the patient/family who expressed understanding and agree(s) with the plan. Discharge instructions discussed at length. The patient/family was given strict return precautions who verbalized understanding of the instructions. No further questions at time of discharge.  Disposition: Discharge  Condition: Good  ED Discharge Orders     None        Follow Up: Stephane Leita DEL, MD [address] [place] [address] [phone]  Call  to schedule an appointment for close follow up    This chart was dictated using voice recognition software.  Despite best efforts to proofread,  errors can occur which can change the documentation meaning.    Trine Raynell Moder, MD 01/15/24 [phone]

## 2024-01-19 DIAGNOSIS — D141 Benign neoplasm of larynx: Secondary | ICD-10-CM | POA: Diagnosis not present

## 2024-01-19 DIAGNOSIS — R490 Dysphonia: Secondary | ICD-10-CM | POA: Diagnosis not present

## 2024-01-19 DIAGNOSIS — J300 Vasomotor rhinitis: Secondary | ICD-10-CM | POA: Diagnosis not present

## 2024-01-21 DIAGNOSIS — G3184 Mild cognitive impairment, so stated: Secondary | ICD-10-CM | POA: Diagnosis not present

## 2024-01-26 ENCOUNTER — Inpatient Hospital Stay: Attending: Hematology and Oncology

## 2024-01-26 DIAGNOSIS — D472 Monoclonal gammopathy: Secondary | ICD-10-CM | POA: Insufficient documentation

## 2024-01-26 DIAGNOSIS — M810 Age-related osteoporosis without current pathological fracture: Secondary | ICD-10-CM | POA: Insufficient documentation

## 2024-01-26 LAB — CBC WITH DIFFERENTIAL (CANCER CENTER ONLY)
Abs Immature Granulocytes: 0.01 K/uL (ref 0.00–0.07)
Basophils Absolute: 0.1 K/uL (ref 0.0–0.1)
Basophils Relative: 1 %
Eosinophils Absolute: 0.1 K/uL (ref 0.0–0.5)
Eosinophils Relative: 2 %
HCT: 38.4 % (ref 36.0–46.0)
Hemoglobin: 13.3 g/dL (ref 12.0–15.0)
Immature Granulocytes: 0 %
Lymphocytes Relative: 31 %
Lymphs Abs: 1.6 K/uL (ref 0.7–4.0)
MCH: 32.7 pg (ref 26.0–34.0)
MCHC: 34.6 g/dL (ref 30.0–36.0)
MCV: 94.3 fL (ref 80.0–100.0)
Monocytes Absolute: 0.4 K/uL (ref 0.1–1.0)
Monocytes Relative: 7 %
Neutro Abs: 3.1 K/uL (ref 1.7–7.7)
Neutrophils Relative %: 59 %
Platelet Count: 250 K/uL (ref 150–400)
RBC: 4.07 MIL/uL (ref 3.87–5.11)
RDW: 12 % (ref 11.5–15.5)
WBC Count: 5.3 K/uL (ref 4.0–10.5)
nRBC: 0 % (ref 0.0–0.2)

## 2024-01-26 LAB — VITAMIN D 25 HYDROXY (VIT D DEFICIENCY, FRACTURES): Vit D, 25-Hydroxy: 35.52 ng/mL (ref 30–100)

## 2024-01-26 LAB — SEDIMENTATION RATE: Sed Rate: 13 mm/h (ref 0–22)

## 2024-01-26 LAB — CMP (CANCER CENTER ONLY)
ALT: 20 U/L (ref 0–44)
AST: 19 U/L (ref 15–41)
Albumin: 4.4 g/dL (ref 3.5–5.0)
Alkaline Phosphatase: 59 U/L (ref 38–126)
Anion gap: 5 (ref 5–15)
BUN: 14 mg/dL (ref 8–23)
CO2: 29 mmol/L (ref 22–32)
Calcium: 9.4 mg/dL (ref 8.9–10.3)
Chloride: 101 mmol/L (ref 98–111)
Creatinine: 0.97 mg/dL (ref 0.44–1.00)
GFR, Estimated: 58 mL/min — ABNORMAL LOW (ref >60)
Glucose, Bld: 91 mg/dL (ref 70–99)
Potassium: 3.9 mmol/L (ref 3.5–5.1)
Sodium: 135 mmol/L (ref 135–145)
Total Bilirubin: 0.4 mg/dL (ref 0.0–1.2)
Total Protein: 7 g/dL (ref 6.5–8.1)

## 2024-01-26 LAB — LACTATE DEHYDROGENASE: LDH: 115 U/L (ref 98–192)

## 2024-01-27 LAB — KAPPA/LAMBDA LIGHT CHAINS
Kappa free light chain: 12.4 mg/L (ref 3.3–19.4)
Kappa, lambda light chain ratio: 0.98 (ref 0.26–1.65)
Lambda free light chains: 12.7 mg/L (ref 5.7–26.3)

## 2024-01-27 LAB — BETA 2 MICROGLOBULIN, SERUM: Beta-2 Microglobulin: 1.9 mg/L (ref 0.6–2.4)

## 2024-01-29 ENCOUNTER — Encounter

## 2024-01-29 ENCOUNTER — Ambulatory Visit

## 2024-01-29 DIAGNOSIS — R55 Syncope and collapse: Secondary | ICD-10-CM

## 2024-01-29 LAB — CUP PACEART REMOTE DEVICE CHECK
Date Time Interrogation Session: 20251106001314
Implantable Pulse Generator Implant Date: 20250903

## 2024-01-30 ENCOUNTER — Ambulatory Visit: Payer: Self-pay | Admitting: Internal Medicine

## 2024-01-30 LAB — MULTIPLE MYELOMA PANEL, SERUM
Albumin SerPl Elph-Mcnc: 3.9 g/dL (ref 2.9–4.4)
Albumin/Glob SerPl: 1.5 (ref 0.7–1.7)
Alpha 1: 0.3 g/dL (ref 0.0–0.4)
Alpha2 Glob SerPl Elph-Mcnc: 0.7 g/dL (ref 0.4–1.0)
B-Globulin SerPl Elph-Mcnc: 1.1 g/dL (ref 0.7–1.3)
Gamma Glob SerPl Elph-Mcnc: 0.6 g/dL (ref 0.4–1.8)
Globulin, Total: 2.7 g/dL (ref 2.2–3.9)
IgA: 126 mg/dL (ref 64–422)
IgG (Immunoglobin G), Serum: 660 mg/dL (ref 586–1602)
IgM (Immunoglobulin M), Srm: 193 mg/dL (ref 26–217)
M Protein SerPl Elph-Mcnc: 0.3 g/dL — ABNORMAL HIGH
Total Protein ELP: 6.6 g/dL (ref 6.0–8.5)

## 2024-02-02 NOTE — Progress Notes (Signed)
 Remote Loop Recorder Transmission

## 2024-02-05 ENCOUNTER — Inpatient Hospital Stay (HOSPITAL_BASED_OUTPATIENT_CLINIC_OR_DEPARTMENT_OTHER): Admitting: Hematology and Oncology

## 2024-02-05 VITALS — BP 120/71 | HR 56 | Resp 18 | Ht 66.0 in | Wt 127.0 lb

## 2024-02-05 DIAGNOSIS — D472 Monoclonal gammopathy: Secondary | ICD-10-CM

## 2024-02-05 DIAGNOSIS — M810 Age-related osteoporosis without current pathological fracture: Secondary | ICD-10-CM | POA: Diagnosis not present

## 2024-02-06 ENCOUNTER — Encounter: Payer: Self-pay | Admitting: Hematology and Oncology

## 2024-02-06 ENCOUNTER — Encounter: Payer: Self-pay | Admitting: Internal Medicine

## 2024-02-06 NOTE — Progress Notes (Signed)
 [facility] OFFICE PROGRESS NOTE  Patient Care Team: Stephane Leita DEL, MD as PCP - General (Internal Medicine) Raford Riggs, MD as PCP - Cardiology (Cardiology)  ASSESSMENT & PLAN:  Assessment & Plan MGUS (monoclonal gammopathy of unknown significance) The patient has incidental finding of MGUS, IgG lambda subtype  She has no associated anemia, renal failure or hypercalcemia Her last prior imaging studies performed showed no evidence of lytic lesions or lymphadenopathy I discussed natural history of MGUS with the patient and her daughter  She just need to be observed I will see her once a year  Orders Placed This Encounter  Procedures   CBC with Differential (Cancer Center Only)    Standing Status:   Future    Expiration Date:   02/05/2025   CMP (Cancer Center only)    Standing Status:   Future    Expiration Date:   02/05/2025   Kappa/lambda light chains    Standing Status:   Standing    Number of Occurrences:   22    Expiration Date:   02/05/2025   Multiple Myeloma Panel (SPEP&IFE w/QIG)    Standing Status:   Standing    Number of Occurrences:   22    Expiration Date:   02/05/2025     INTERVAL HISTORY: she returns for surveillance follow-up for diagnosis of MGUS Patient denies recurrent infection or bone pain We reviewed recent CBC, CMP and myeloma panel results  PHYSICAL EXAMINATION: ECOG PERFORMANCE STATUS: 0 - Asymptomatic  Vitals:   02/05/24 1512  BP: 120/71  Pulse: (!) 56  Resp: 18  SpO2: 97%

## 2024-02-06 NOTE — Assessment & Plan Note (Addendum)
 The patient has incidental finding of MGUS, IgG lambda subtype  She has no associated anemia, renal failure or hypercalcemia Her last prior imaging studies performed showed no evidence of lytic lesions or lymphadenopathy I discussed natural history of MGUS with the patient and her daughter  She just need to be observed I will see her once a year

## 2024-02-09 ENCOUNTER — Encounter

## 2024-02-29 ENCOUNTER — Ambulatory Visit

## 2024-02-29 DIAGNOSIS — R55 Syncope and collapse: Secondary | ICD-10-CM | POA: Diagnosis not present

## 2024-03-01 ENCOUNTER — Encounter

## 2024-03-02 LAB — CUP PACEART REMOTE DEVICE CHECK
Date Time Interrogation Session: 20251206232858
Implantable Pulse Generator Implant Date: 20250903

## 2024-03-03 ENCOUNTER — Ambulatory Visit: Payer: Self-pay | Admitting: Internal Medicine

## 2024-03-03 NOTE — Patient Instructions (Signed)
 Below is our plan:  We will continue to monitor vision changes. Likely ocular migraines. May take Tylenol  as needed for headache. Update eye exam. Continue donepezil  10mg  daily   Please make sure you are staying well hydrated. I recommend 50-60 ounces daily. Well balanced diet and regular exercise encouraged. Consistent sleep schedule with 6-8 hours recommended.   Please continue follow up with care team as directed.   Follow up with Dr Chalice in 3-4 months   You may receive a survey regarding today's visit. I encourage you to leave honest feed back as I do use this information to improve patient care. Thank you for seeing me today!   Management of Memory Problems   There are some general things you can do to help manage your memory problems.  Your memory may not in fact recover, but by using techniques and strategies you will be able to manage your memory difficulties better.   1)  Establish a routine. Try to establish and then stick to a regular routine.  By doing this, you will get used to what to expect and you will reduce the need to rely on your memory.  Also, try to do things at the same time of day, such as taking your medication or checking your calendar first thing in the morning. Think about think that you can do as a part of a regular routine and make a list.  Then enter them into a daily planner to remind you.  This will help you establish a routine.   2)  Organize your environment. Organize your environment so that it is uncluttered.  Decrease visual stimulation.  Place everyday items such as keys or cell phone in the same place every day (ie.  Basket next to front door) Use post it notes with a brief message to yourself (ie. Turn off light, lock the door) Use labels to indicate where things go (ie. Which cupboards are for food, dishes, etc.) Keep a notepad and pen by the telephone to take messages   3)  Memory Aids A diary or journal/notebook/daily planner Making a list  (shopping list, chore list, to do list that needs to be done) Using an alarm as a reminder (kitchen timer or cell phone alarm) Using cell phone to store information (Notes, Calendar, Reminders) Calendar/White board placed in a prominent position Post-it notes   In order for memory aids to be useful, you need to have good habits.  It's no good remembering to make a note in your journal if you don't remember to look in it.  Try setting aside a certain time of day to look in journal.   4)  Improving mood and managing fatigue. There may be other factors that contribute to memory difficulties.  Factors, such as anxiety, depression and tiredness can affect memory. Regular gentle exercise can help improve your mood and give you more energy. Exercise: there are short videos created by the [employer] on Health specially for older adults: https://bit.ly/2I30q97.  Mediterranean diet: which emphasizes fruits, vegetables, whole grains, legumes, fish, and other seafood; unsaturated fats such as olive oils; and low amounts of red meat, eggs, and sweets. A variation of this, called MIND [facility] Intervention for Neurodegenerative Delay) incorporates the DASH (Dietary Approaches to Stop Hypertension) diet, which has been shown to lower high blood pressure, a risk factor for Alzheimers disease. More information at: exitmarketing.de.  Aerobic exercise that improve heart health is also good for the mind.  [employer] on Aging have  short videos for exercises that you can do at home: Blindworkshop.com.pt Simple relaxation techniques may help relieve symptoms of anxiety Try to get back to completing activities or hobbies you enjoyed doing in the past. Learn to pace yourself through activities to decrease fatigue. Find out about some local support groups where you can share experiences with others. Try and achieve 7-8  hours of sleep at night.   Tasks to improve attention/working memory 1. Good sleep hygiene (7-8 hrs of sleep) 2. Learning a new skill (Painting, Carpentry, Pottery, new language, Knitting). 3.Cognitive exercises (keep a daily journal, Puzzles) 4. Physical exercise and training  (30 min/day X 4 days week) 5. Being on Antidepressant if needed 6.Yoga, Meditation, Tai Chi 7. Decrease alcohol  intake 8.Have a clear schedule and structure in daily routine   MIND Diet: The Mediterranean-DASH Diet Intervention for Neurodegenerative Delay, or MIND diet, targets the health of the aging brain. Research participants with the highest MIND diet scores had a significantly slower rate of cognitive decline compared with those with the lowest scores. The effects of the MIND diet on cognition showed greater effects than either the Mediterranean or the DASH diet alone.   The healthy items the MIND diet guidelines suggest include:   3+ servings a day of whole grains 1+ servings a day of vegetables (other than green leafy) 6+ servings a week of green leafy vegetables 5+ servings a week of nuts 4+ meals a week of beans 2+ servings a week of berries 2+ meals a week of poultry 1+ meals a week of fish Mainly olive oil if added fat is used   The unhealthy items, which are higher in saturated and trans fat, include: Less than 5 servings a week of pastries and sweets Less than 4 servings a week of red meat (including beef, pork, lamb, and products made from these meats) Less than one serving a week of cheese and fried foods Less than 1 tablespoon a day of butter/stick margarine

## 2024-03-03 NOTE — Progress Notes (Signed)
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 here with     ICD-10-CM   1. REM sleep behavior disorder  G47.52     2. 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 She has discontinued clonazepam  following several falls and syncopal events.  Memory stable. She will continue Aricept  10mg  daily for memory support. MMSE 29/30, 03/2023. Repeat testing offered but declined. She was encouraged to monitor ocular symptoms. Neuro exam intact. Consider restarting rx for dry eye. Consider MRI, she declines at this time. Advised to have ophthalmology exam annually, more frequent if needed. Ensure adequate hydration. She was  encouraged to stay physically and mentally active. Healthy lifestyle habits encouraged. She will follow up with Dr Chalice in 3-4 months to review case. She verbalizes understanding and agreement with this plan.    No orders of the defined types were placed in this encounter.    No orders of the defined types were placed in this encounter.      Greig Forbes, FNP-C 03/04/2024, 3:23 PM [facility] [address], [address] [place], [place] [address] [phone]

## 2024-03-04 ENCOUNTER — Encounter: Payer: Self-pay | Admitting: Family Medicine

## 2024-03-04 ENCOUNTER — Ambulatory Visit: Admitting: Family Medicine

## 2024-03-04 VITALS — BP 124/84 | HR 79 | Ht 66.0 in | Wt 128.5 lb

## 2024-03-04 DIAGNOSIS — G4752 REM sleep behavior disorder: Secondary | ICD-10-CM | POA: Diagnosis not present

## 2024-03-04 DIAGNOSIS — G3184 Mild cognitive impairment, so stated: Secondary | ICD-10-CM

## 2024-03-04 DIAGNOSIS — M4310 Spondylolisthesis, site unspecified: Secondary | ICD-10-CM | POA: Insufficient documentation

## 2024-03-04 DIAGNOSIS — M48061 Spinal stenosis, lumbar region without neurogenic claudication: Secondary | ICD-10-CM | POA: Insufficient documentation

## 2024-03-09 NOTE — Progress Notes (Signed)
 Remote Loop Recorder Transmission

## 2024-03-11 ENCOUNTER — Encounter

## 2024-03-31 ENCOUNTER — Ambulatory Visit

## 2024-03-31 DIAGNOSIS — R55 Syncope and collapse: Secondary | ICD-10-CM

## 2024-04-01 ENCOUNTER — Encounter

## 2024-04-01 LAB — CUP PACEART REMOTE DEVICE CHECK
Date Time Interrogation Session: 20260106233752
Implantable Pulse Generator Implant Date: 20250903

## 2024-04-05 NOTE — Progress Notes (Signed)
 Remote Loop Recorder Transmission

## 2024-04-09 ENCOUNTER — Ambulatory Visit: Payer: Self-pay | Admitting: Cardiovascular Disease

## 2024-04-12 ENCOUNTER — Encounter

## 2024-04-21 ENCOUNTER — Telehealth (HOSPITAL_BASED_OUTPATIENT_CLINIC_OR_DEPARTMENT_OTHER): Payer: Self-pay | Admitting: *Deleted

## 2024-04-21 ENCOUNTER — Telehealth (HOSPITAL_BASED_OUTPATIENT_CLINIC_OR_DEPARTMENT_OTHER): Payer: Self-pay

## 2024-04-21 NOTE — Telephone Encounter (Signed)
 S/w the pt and she scheduled tele preop appt 05/05/24. Med rec and consent are done. In over conversation the pt tells me that she is feeling better in regard to the back issues. She said she is going to s/w DR. Pool further for another assessment as she tells me she recently had an ablation for her back and is doing better.   We left the call with the tele preop appt scheduled however; the pt will call and cancel if she and Dr. Louis agree surgery no longer needed.

## 2024-04-21 NOTE — Telephone Encounter (Signed)
 S/w the pt and she scheduled tele preop appt 05/05/24. Med rec and consent are done. In over conversation the pt tells me that she is feeling better in regard to the back issues. She said she is going to s/w DR. Pool further for another assessment as she tells me she recently had an ablation for her back and is doing better.   We left the call with the tele preop appt scheduled however; the pt will call and cancel if she and Dr. Louis agree surgery no longer needed.      Patient Consent for Virtual Visit        Kara Tapia has provided verbal consent on 04/21/2024 for a virtual visit (video or telephone).   CONSENT FOR VIRTUAL VISIT FOR:  Kara Tapia  By participating in this virtual visit I agree to the following:  I hereby voluntarily request, consent and authorize [facility] and its employed or contracted physicians, physician assistants, nurse practitioners or other licensed health care professionals (the Practitioner), to provide me with telemedicine health care services (the Services) as deemed necessary by the treating Practitioner. I acknowledge and consent to receive the Services by the Practitioner via telemedicine. I understand that the telemedicine visit will involve communicating with the Practitioner through live audiovisual communication technology and the disclosure of certain medical information by electronic transmission. I acknowledge that I have been given the opportunity to request an in-person assessment or other available alternative prior to the telemedicine visit and am voluntarily participating in the telemedicine visit.  I understand that I have the right to withhold or withdraw my consent to the use of telemedicine in the course of my care at any time, without affecting my right to future care or treatment, and that the Practitioner or I may terminate the telemedicine visit at any time. I understand that I have the right to inspect all information obtained  and/or recorded in the course of the telemedicine visit and may receive copies of available information for a reasonable fee.  I understand that some of the potential risks of receiving the Services via telemedicine include:  Delay or interruption in medical evaluation due to technological equipment failure or disruption; Information transmitted may not be sufficient (e.g. poor resolution of images) to allow for appropriate medical decision making by the Practitioner; and/or  In rare instances, security protocols could fail, causing a breach of personal health information.  Furthermore, I acknowledge that it is my responsibility to provide information about my medical history, conditions and care that is complete and accurate to the best of my ability. I acknowledge that Practitioner's advice, recommendations, and/or decision may be based on factors not within their control, such as incomplete or inaccurate data provided by me or distortions of diagnostic images or specimens that may result from electronic transmissions. I understand that the practice of medicine is not an exact science and that Practitioner makes no warranties or guarantees regarding treatment outcomes. I acknowledge that a copy of this consent can be made available to me via my patient portal [facility] MyChart), or I can request a printed copy by calling the office of [facility].    I understand that my insurance will be billed for this visit.   I have read or had this consent read to me. I understand the contents of this consent, which adequately explains the benefits and risks of the Services being provided via telemedicine.  I have been provided ample opportunity to ask  questions regarding this consent and the Services and have had my questions answered to my satisfaction. I give my informed consent for the services to be provided through the use of telemedicine in my medical care

## 2024-04-21 NOTE — Telephone Encounter (Signed)
" ° °  Name: Kara Tapia  DOB: [birth year]  MRN: [id number]  Primary Cardiologist: Annabella Scarce, MD   Preoperative team, please contact this patient and set up a phone call appointment for further preoperative risk assessment. Please obtain consent and complete medication review. Thank you for your help.  I confirm that guidance regarding antiplatelet and oral anticoagulation therapy has been completed and, if necessary, noted below. - Okay to hold Aspirin  from a cardiac standpoint.   I also confirmed the patient resides in the state of [place] . As per [facility] Medical Board telemedicine laws, the patient must reside in the state in which the provider is licensed.   Lylah Lantis E Vinetta Brach, PA-C 04/21/2024, 1:00 PM [facility]    "

## 2024-04-21 NOTE — Telephone Encounter (Signed)
"  ° °  Pre-operative Risk Assessment    Patient Name: Kara Tapia  DOB: [birth year] MRN: [id number]   Date of last office visit: 12/31/2023 with Dr. Waddell Date of next office visit: None  Request for Surgical Clearance    Procedure:  L4-5 Lumbar Fusion  Date of Surgery:  Clearance TBD                                 Surgeon:  [facility]. Pool Surgeon's Group or Practice Name:  [facility]   Phone number:  [phone] - [place] Fax number:  [phone]   Type of Clearance Requested:   - Medical  - Pharmacy:  Hold Aspirin  -does not specify   Type of Anesthesia:  General    Additional requests/questions:  None  Signed, Patrcia Iverson CROME   04/21/2024, 12:41 PM   "

## 2024-05-01 ENCOUNTER — Encounter

## 2024-05-03 ENCOUNTER — Encounter

## 2024-05-05 ENCOUNTER — Ambulatory Visit

## 2024-06-01 ENCOUNTER — Ambulatory Visit

## 2024-06-22 ENCOUNTER — Ambulatory Visit: Admitting: Family Medicine

## 2024-06-22 ENCOUNTER — Ambulatory Visit: Admitting: Neurology

## 2024-07-02 ENCOUNTER — Ambulatory Visit

## 2024-08-02 ENCOUNTER — Ambulatory Visit

## 2024-09-02 ENCOUNTER — Ambulatory Visit

## 2024-10-03 ENCOUNTER — Ambulatory Visit

## 2024-11-03 ENCOUNTER — Ambulatory Visit

## 2024-12-04 ENCOUNTER — Ambulatory Visit

## 2025-01-04 ENCOUNTER — Ambulatory Visit

## 2025-01-25 ENCOUNTER — Inpatient Hospital Stay

## 2025-02-04 ENCOUNTER — Ambulatory Visit

## 2025-02-04 ENCOUNTER — Inpatient Hospital Stay: Admitting: Hematology and Oncology

## 2025-03-07 ENCOUNTER — Ambulatory Visit

## 2025-04-07 ENCOUNTER — Ambulatory Visit
